# Patient Record
Sex: Female | Born: 1950
Health system: Southern US, Community
[De-identification: ages and names within clinical notes are randomized; demographics above are authoritative.]

## PROBLEM LIST (undated history)

## (undated) DIAGNOSIS — M659 Unspecified synovitis and tenosynovitis, unspecified site: Secondary | ICD-10-CM

## (undated) DIAGNOSIS — B351 Tinea unguium: Secondary | ICD-10-CM

## (undated) DIAGNOSIS — R519 Headache, unspecified: Secondary | ICD-10-CM

## (undated) DIAGNOSIS — E785 Hyperlipidemia, unspecified: Secondary | ICD-10-CM

## (undated) DIAGNOSIS — G4700 Insomnia, unspecified: Secondary | ICD-10-CM

## (undated) DIAGNOSIS — R51 Headache: Secondary | ICD-10-CM

## (undated) DIAGNOSIS — M1990 Unspecified osteoarthritis, unspecified site: Secondary | ICD-10-CM

## (undated) DIAGNOSIS — L0390 Cellulitis, unspecified: Secondary | ICD-10-CM

## (undated) DIAGNOSIS — G20 Parkinson's disease: Secondary | ICD-10-CM

## (undated) DIAGNOSIS — M719 Bursopathy, unspecified: Secondary | ICD-10-CM

## (undated) DIAGNOSIS — G2581 Restless legs syndrome: Secondary | ICD-10-CM

## (undated) HISTORY — DX: Bursopathy, unspecified: M71.9

## (undated) HISTORY — DX: Headache: R51

## (undated) HISTORY — PX: CAROTID ENDARTERECTOMY: SUR193

## (undated) HISTORY — DX: Parkinson's disease: G20

## (undated) HISTORY — DX: Cellulitis, unspecified: L03.90

## (undated) HISTORY — DX: Hyperlipidemia, unspecified: E78.5

## (undated) HISTORY — DX: Restless legs syndrome: G25.81

## (undated) HISTORY — DX: Unspecified synovitis and tenosynovitis, unspecified site: M65.90

## (undated) HISTORY — DX: Synovitis and tenosynovitis, unspecified: M65.9

## (undated) HISTORY — DX: Headache, unspecified: R51.9

## (undated) HISTORY — PX: TUBAL LIGATION: SHX77

## (undated) HISTORY — DX: Insomnia, unspecified: G47.00

## (undated) HISTORY — DX: Unspecified osteoarthritis, unspecified site: M19.90

## (undated) HISTORY — DX: Tinea unguium: B35.1

---

## 2004-11-19 ENCOUNTER — Ambulatory Visit: Payer: Self-pay | Admitting: Cardiology

## 2006-06-25 ENCOUNTER — Ambulatory Visit: Payer: Self-pay | Admitting: Internal Medicine

## 2006-07-28 ENCOUNTER — Ambulatory Visit: Payer: Self-pay | Admitting: Internal Medicine

## 2006-08-14 ENCOUNTER — Ambulatory Visit: Payer: Self-pay | Admitting: Internal Medicine

## 2006-08-22 ENCOUNTER — Ambulatory Visit: Payer: Self-pay | Admitting: Internal Medicine

## 2006-08-22 ENCOUNTER — Ambulatory Visit (HOSPITAL_COMMUNITY): Admission: RE | Admit: 2006-08-22 | Discharge: 2006-08-22 | Payer: Self-pay | Admitting: Internal Medicine

## 2006-08-27 ENCOUNTER — Ambulatory Visit: Payer: Self-pay | Admitting: Internal Medicine

## 2007-03-03 DIAGNOSIS — R05 Cough: Secondary | ICD-10-CM

## 2007-03-03 DIAGNOSIS — J13 Pneumonia due to Streptococcus pneumoniae: Secondary | ICD-10-CM | POA: Insufficient documentation

## 2007-03-03 DIAGNOSIS — R059 Cough, unspecified: Secondary | ICD-10-CM | POA: Insufficient documentation

## 2007-03-04 ENCOUNTER — Ambulatory Visit: Payer: Self-pay | Admitting: Internal Medicine

## 2007-03-04 DIAGNOSIS — R079 Chest pain, unspecified: Secondary | ICD-10-CM | POA: Insufficient documentation

## 2007-11-12 ENCOUNTER — Ambulatory Visit: Payer: Self-pay | Admitting: Internal Medicine

## 2007-11-16 ENCOUNTER — Telehealth (INDEPENDENT_AMBULATORY_CARE_PROVIDER_SITE_OTHER): Payer: Self-pay | Admitting: *Deleted

## 2007-11-20 ENCOUNTER — Ambulatory Visit: Payer: Self-pay | Admitting: Internal Medicine

## 2010-03-18 ENCOUNTER — Encounter: Payer: Self-pay | Admitting: Internal Medicine

## 2010-07-10 NOTE — Assessment & Plan Note (Signed)
[facility]                             PULMONARY OFFICE NOTE   NAME:[name] [name] [place]                       MRN:          [id number]  DATE:08/14/2006                            DOB:          1950/09/26    REASON FOR CONSULTATION:  Persistent pneumonia.   HISTORY:  This is a 60 year old female, never smoker with acute onset of  headache, nausea, and left flank pain that radiated around her naval  associated with fever and chills, all occurring abruptly on May 25. She  was hospitalized for 4 days with a diagnosis of pneumonia. She was on  antibiotics, although she does not know what type. Since then all of her  symptoms have resolved except for a dry cough that persists to the point  where she gags when she coughs, and seems worse in afternoon, and early  evening. She does have a history of seasonal rhinitis but denies any  present flare.   SUBJECTIVE:  Active itching, sneezing, subjective wheezing, overt reflux  or sinus symptoms.   She also denies any recent dental work.   She was diagnosed and treated for left lower lobe pneumonia by Dr.  Dimas Aguas but with improving density in the right lower lobe by most recent  CT scan dated June 13.   PAST MEDICAL HISTORY:  SIGNIFICANT FOR PENICILLIN ALLERGY.   SOCIAL HISTORY:  She has never smoked.   MEDICATIONS:  Include; Combivent, Advil, multivitamins, and fiber.   She works as a Research officer, trade union.   FAMILY HISTORY:  Recorded in detail, significant for allergies in her  father and sisters. Nobody has asthma however.   REVIEW OF SYSTEMS:  Taken in detail on the worksheet. Negative except as  outlined above.   PHYSICAL EXAMINATION:  This is a pleasant, ambulatory middle aged female  in no acute distress. She is afebrile, stable vital signs.  HEENT: Unremarkable.  PHARYNX: Clear. There is no evidence of excessive postnasal drainage or  cobblestoning.  NECK: Supple without cervical adenopathy or  tenderness. Trachea is  midline.  LUNG FIELDS: Perfectly clear bilaterally to auscultation and percussion.  There is a regular rate and rhythm without murmur, gallop, rub.  ABDOMEN: Soft, benign.  EXTREMITIES: Warm without calf tenderness, cyanosis, clubbing, or edema.   CT scan was reviewed as noted on June 13 as showing improvement in the  right lower lobe density.   IMPRESSION:  Chronic cough has developed in the setting of recent  pneumonia which is resolving nicely radiographically and has already  been appropriately treated. It may well be that she has a typical post  broncho pneumonia syndrome with mucociliary dysfunction, but note that  the cough is dry, mostly daytime, and occurs in the afternoon, and early  evenings which would be very unusual for an airways disease induced  cough. Therefore she may have developed a cough inducing reflux inducing  more cough, a typical cycle we see here in the pulmonary clinic, and  therefore recommended the following;  1. Take Prevacid perfectly regularly before her first meal until she      has used  up the samples we gave her (30 days). If wheezing or      shortness of breath she should use Combivent, but if coughing she      should use Mucinex DM 2 every 12 hours.  2. I would do a follow up chest x-ray (this is really not all that      critical in a never smoker with resolving infiltrates on CT scan,      but she is quite anxious about her x-rays and I would certainly      support a follow up study, but not necessarily in the pulmonary      clinic)  in 6 weeks and I would be happy to do that here in this      clinic if she still has symptoms. Otherwise she can certainly see      Dr. Dimas Aguas for this purpose and we will see her back here on a      p.r.n. basis.     Charlaine Dalton. Sherene Sires, MD, [facility]  Electronically Signed    MBW/MedQ  DD: 08/21/2006  DT: 08/21/2006  Job #: [id number]   cc:   Selinda Flavin

## 2010-07-10 NOTE — H&P (Signed)
NAME:  [name] [name], [name] [name]                ACCOUNT NO.:  [id number]   MEDICAL RECORD NO.:  [id number]          PATIENT TYPE:  AMB   LOCATION:  DAY                           FACILITY:  [facility]   PHYSICIAN:  R. Roetta Sessions, M.D. DATE OF BIRTH:  [birth year]   DATE OF ADMISSION:  DATE OF DISCHARGE:  [facility]                              HISTORY & PHYSICAL   CHIEF COMPLAINT:  Abdominal pain, nausea, constipation.   [name] [name] is a very pleasant 60 year old African American female sent  over by Dr. Dimas Aguas.  She was seen originally on June 25, 2006 with  vague left-sided abdominal pain and nausea.   She was found to have a significant amount of stool on plain films  previously.  Ultrasound and CT (noncontrast) were unrevealing.  She  became ill with headache, nausea, and abdominal pain and was admitted to  [facility] a week ago.  CT demonstrated right lower lobe  pneumonia.  She had a large stool burden in the right colon.  Dr. Dimas Aguas  called me and we discussed the case over the telephone.  It was decided  to go ahead and give this lady some GoLYTELY which she took overnight  and had copious BMs, and the abdominal pain significantly improved.  She  has been on MiraLax 17 grams orally at bedtime and apple juice since  that time.  She has finished her Levaquin and Zithromax and is only on  MiraLax.  She does have a cough but is not having any chest pain,  dyspnea, shortness of breath.  She has minimal left lower quadrant  abdominal pain.  She states she has a bowel movement every day, but it  is not a very large one.  She has had some intermittent rectal bleeding  prior to colonoscopy back in 2004 by Dr. Martha Clan that was unrevealing.  She is not having any nausea or vomiting.   PAST MEDICAL HISTORY:  Negative for chronic GI or liver illness.   CURRENT MEDICATIONS:  1. MiraLax 70 grams orally at bedtime.  2. Benefiber 2 tablets daily.  3. Ambien 10 mg at bedtime.  4. Advil p.r.n.   ALLERGIES:   PENICILLIN CAUSES SWELLING AND ITCHING, LODINE CAUSES FACIAL  SWELLING.   PAST SURGICAL HISTORY:  1. Tubal ligation.  2. Toe surgery.   FAMILY HISTORY:  Notable for chronic GI or liver illness, inflammatory  bowel disease.  Father passed away from prostate cancer.  Her brother  has diabetes.   SOCIAL HISTORY:  The patient is married.  She has 2 biological children.  She is a Architectural technologist.  She has never been a smoker.  No alcohol.   REVIEW OF SYSTEMS:  As in history of present illness.   PHYSICAL EXAMINATION:  VITAL SIGNS:  She weighed 163 on June 25, 2006.  She weighs 161.5 today.  She is accompanied by her husband.  Height 5  feet 7.5 inches.  Temperature 98.2, BP 118/80, pulse 76.  SKIN:  Warm and dry.  HEENT:  No scleral icterus.  Conjunctivae are pink.  CHEST:  Lungs are clear to  auscultation.  CARDIAC:  Regular rate and rhythm without murmur, gallop, rub.  BREASTS:  Exam is deferred.  ABDOMEN:  Flat, positive bowel sounds, soft, nontender, without  appreciable mass or organomegaly.  EXTREMITIES:  No edema.  RECTAL:  Exam deferred until the time of colonoscopy.   IMPRESSION:  [name] [name] is a very pleasant 60 year old lady with  nonspecific gastrointestinal symptoms, including abdominal pain and  nausea.  She was found to have been significantly constipated on imaging  at [facility] recently where she was admitted with pneumonia.  With a GoLYTELY purge, she has experienced significant improvement in  her symptoms.  I would have to wonder if she does not have a slow  transit.  Constipation or colonic inertia is the underlying cause of her  recent gastrointestinal symptoms.  She has had intermittent  hematochezia, and it has been some time since she last had a  colonoscopy.   RECOMMENDATIONS:  I told [name] [name] that I would allow her to fully  recover from her recent pulmonary infection.  We will plan to perform a  diagnostic colonoscopy in  approximately 2 weeks.  The potential risks,  benefits, and alternatives have been reviewed.  She has a persistent  cough for which I advised her to follow up with Dr. Dimas Aguas.  She is to  continue MiraLax 17 grams orally at bedtime.  We will make further  recommendations in the very near future.  End of dictation.      Jonathon Bellows, M.D.  Electronically Signed     RMR/MEDQ  D:  07/28/2006  T:  07/28/2006  Job:  [id number]

## 2010-07-10 NOTE — Op Note (Signed)
NAME:  [name] [name], [name] [name]                ACCOUNT NO.:  [id number]   MEDICAL RECORD NO.:  [id number]          PATIENT TYPE:  AMB   LOCATION:  DAY                           FACILITY:  [facility]   PHYSICIAN:  R. Roetta Sessions, M.D. DATE OF BIRTH:  1951/02/16   DATE OF PROCEDURE:  08/22/2006  DATE OF DISCHARGE:                               OPERATIVE REPORT   PROCEDURE:  Colonoscopy, diagnostic.   INDICATIONS FOR PROCEDURE:  A 60 year old lady with nonspecific  abdominal pain in the setting of marked constipation who has had  intermittent hematochezia along the way.  Diagnostic colonoscopy is now  being done.  It is notable, taking MiraLax daily her abdominal pain and  constipation have resolved.  She does take hydrocodone on occasion.  Colonoscopy is now being done.  This approach has been discussed with  the patient at length.  The potential risks, benefits and alternatives  have been reviewed, questions answered, she is agreeable.  Please see  documentation in medical record.   PROCEDURE NOTE:  The O2 saturation, blood pressure, pulse, respirations  were monitored throughout entire procedure.   CONSCIOUS SEDATION:  1. Versed 4 mg IV.  2. Demerol 75 mg IV in divided doses.   INSTRUMENT:  Pentax video chip system.   FINDINGS:  Digital rectal exam revealed no abnormalities.   ENDOSCOPIC FINDINGS:  The prep was adequate.  Colon:  Colonic mucosa was  surveyed from the rectosigmoid junction, the left transverse, right  colon to the appendiceal orifice, ileocecal valve and cecum.  These  structures were well seen and photographed for the record.  From this  level the scope was slowly withdrawn.  All previously mentioned mucosal  surfaces were again seen.  The patient was noted to have left-sided  diverticulum, remainder of colonic mucosa appeared normal.  She did have  a diffusely stained colonic mucosa to go with melanosis coli.  Scope was  pulled down in rectum where a thorough examination of  the rectal mucosa,  including retroflexed view of the anal verge __________ , demonstrated  only anal canal hemorrhoids.  Patient tolerated the procedure well, was  reactive to endoscopy.   IMPRESSION:  1. Anal canal hemorrhoids, otherwise normal rectum.  2. Melanosis coli.  3. Left-sided diverticulum.  4. Remainder of colonic mucosa appeared normal.   RECOMMENDATIONS:  1. Continue MiraLax orally daily.  2. High fiber diet, she is taking Benefiber, will continue that      approach.  3. Would minimize the use of narcotics as this will impede bowel      function and likely precipitate recurrent symptoms.  4. She is to follow up with Dr. Selinda Flavin.      Jonathon Bellows, M.D.  Electronically Signed     RMR/MEDQ  D:  08/22/2006  T:  08/22/2006  Job:  [id number]

## 2010-07-10 NOTE — Assessment & Plan Note (Signed)
[facility]                             PULMONARY OFFICE NOTE   NAME:[name], [name] SIGLIN                       MRN:          [id number]  DATE:08/27/2006                            DOB:          12-14-1950    PULMONARY/FINAL FOLLOW-UP OFFICE VISIT:   HISTORY:  A 60 year old black female never smoker, abruptly ill in late  May with a clinical diagnosis of pneumonia in the left lower lobe.  She  was well except for a persistent cough and a CT scan that showed  improving density when I saw her on June 19 and recommended 30 days'  worth of Prevacid because of the issue of cyclical coughing and also  suppression of the cough with Mucinex-DM.  Subsequently the cough has  totally resolved.   She had also mentioned vague right-sided posterior chest discomfort that  I assumed was related to coughing when I saw her and recommended Advil.  She says in retrospect the pain really dates back to some time in  April and has been present ever since, located in the right posterior  chest in an area about 4 cm in diameter with no antecedent history of  any cough or rash, fever or chills or pleuritic component.  She says  lying down at night seems to make it a little bit worse.  It is not  exacerbated by use of the arm or associated with any dyspnea.  Does not  get worse with coughing or deep breathing and does not improve with  Advil or even with hydrocodone.   On physical examination, she is a somewhat depressed-appearing  ambulatory black female in no acute distress.  She has stable vital signs.  HEENT:  Unremarkable.  Pharynx clear.  Lung fields perfectly clear bilaterally to auscultation and percussion.  Regular rate and rhythm without murmur, gallop, or rub.  Abdomen is soft, benign.  EXTREMITIES:  Warm without calf tenderness, cyanosis, clubbing or edema.   CT scan from June 13 showed no evidence of abnormality on the right lung  nor any pleural effusion or chest  wall abnormality.   IMPRESSION:  1. Chronic cough after apparent community-acquired pneumonia involving      the left lobe is totally resolved.  I have asked her to complete      the Prevacid and see if at that point the cough exacerbates, in      which case she may have primary reflux (my thinking was that this      was secondary reflux from the cough).  She can use Mucinex-DM on a      p.r.n. basis.  2. The right-sided chest discomfort is more problematic in that it is      typical of a neuralgia pain.  It is present 24 hours a day, does      not respond to Advil or even narcotics, and has no pleuritic or      positional features.  I have no explanation for this pain but might      recommend a repeat CT scan be done if the pain persists (  previously      I did not think a repeat CT scan was necessary on the left side      because she is a never smoker with improving infiltrates and no      effusion).  I would recommend a trial of Zostrix q.i.d. over the      area of discomfort and explained the gate theory to her.  The      other option would be considering referring her      to a pain center or use of a TENS unit, but I will defer this to      Dr. Jeannette How capable judgment and see her back here on a p.r.n.      basis.     Charlaine Dalton. [name] Sires, MD, [facility]  Electronically Signed    MBW/MedQ  DD: 08/27/2006  DT: 08/28/2006  Job #: [id number]   cc:   Selinda Flavin

## 2011-08-28 ENCOUNTER — Inpatient Hospital Stay (HOSPITAL_COMMUNITY)
Admission: AD | Admit: 2011-08-28 | Discharge: 2011-09-02 | DRG: 420 | Disposition: A | Discharge status: Home / Self Care | Payer: BC Managed Care – PPO | Source: Other Acute Inpatient Hospital | Attending: Internal Medicine | Admitting: Internal Medicine

## 2011-08-28 ENCOUNTER — Telehealth: Payer: Self-pay | Admitting: Internal Medicine

## 2011-08-28 DIAGNOSIS — R251 Tremor, unspecified: Secondary | ICD-10-CM | POA: Diagnosis present

## 2011-08-28 DIAGNOSIS — M1710 Unilateral primary osteoarthritis, unspecified knee: Secondary | ICD-10-CM | POA: Diagnosis present

## 2011-08-28 DIAGNOSIS — J13 Pneumonia due to Streptococcus pneumoniae: Secondary | ICD-10-CM

## 2011-08-28 DIAGNOSIS — R21 Rash and other nonspecific skin eruption: Secondary | ICD-10-CM

## 2011-08-28 DIAGNOSIS — H538 Other visual disturbances: Secondary | ICD-10-CM

## 2011-08-28 DIAGNOSIS — M546 Pain in thoracic spine: Secondary | ICD-10-CM | POA: Diagnosis present

## 2011-08-28 DIAGNOSIS — R7401 Elevation of levels of liver transaminase levels: Secondary | ICD-10-CM | POA: Diagnosis present

## 2011-08-28 DIAGNOSIS — K759 Inflammatory liver disease, unspecified: Secondary | ICD-10-CM | POA: Diagnosis present

## 2011-08-28 DIAGNOSIS — R7881 Bacteremia: Secondary | ICD-10-CM | POA: Diagnosis present

## 2011-08-28 DIAGNOSIS — G039 Meningitis, unspecified: Secondary | ICD-10-CM

## 2011-08-28 DIAGNOSIS — R259 Unspecified abnormal involuntary movements: Secondary | ICD-10-CM | POA: Diagnosis present

## 2011-08-28 DIAGNOSIS — R600 Localized edema: Secondary | ICD-10-CM

## 2011-08-28 DIAGNOSIS — R079 Chest pain, unspecified: Secondary | ICD-10-CM

## 2011-08-28 DIAGNOSIS — R51 Headache: Secondary | ICD-10-CM

## 2011-08-28 DIAGNOSIS — M25561 Pain in right knee: Secondary | ICD-10-CM

## 2011-08-28 DIAGNOSIS — M549 Dorsalgia, unspecified: Secondary | ICD-10-CM | POA: Diagnosis present

## 2011-08-28 DIAGNOSIS — R509 Fever, unspecified: Principal | ICD-10-CM

## 2011-08-28 DIAGNOSIS — R519 Headache, unspecified: Secondary | ICD-10-CM | POA: Diagnosis present

## 2011-08-28 DIAGNOSIS — H05229 Edema of unspecified orbit: Secondary | ICD-10-CM | POA: Diagnosis present

## 2011-08-28 DIAGNOSIS — R059 Cough, unspecified: Secondary | ICD-10-CM

## 2011-08-28 DIAGNOSIS — R05 Cough: Secondary | ICD-10-CM

## 2011-08-28 DIAGNOSIS — R7402 Elevation of levels of lactic acid dehydrogenase (LDH): Secondary | ICD-10-CM | POA: Diagnosis present

## 2011-08-28 NOTE — Telephone Encounter (Signed)
Patient from [facility] admitted there on 08/27/11 with fever and headaches - no meningeal signs, no obtundation, Blood Cultures 1/2 GPCs - maybe contaminant, Abdominal Ct negative, no recent tick bites. Mainly transferring down here for ID/Neuro consult per family request [name] [name]

## 2011-08-29 ENCOUNTER — Inpatient Hospital Stay (HOSPITAL_COMMUNITY): Payer: BC Managed Care – PPO

## 2011-08-29 ENCOUNTER — Encounter (HOSPITAL_COMMUNITY): Payer: Self-pay | Admitting: Internal Medicine

## 2011-08-29 DIAGNOSIS — R51 Headache: Secondary | ICD-10-CM

## 2011-08-29 DIAGNOSIS — M25561 Pain in right knee: Secondary | ICD-10-CM | POA: Diagnosis present

## 2011-08-29 DIAGNOSIS — R519 Headache, unspecified: Secondary | ICD-10-CM | POA: Diagnosis present

## 2011-08-29 DIAGNOSIS — M549 Dorsalgia, unspecified: Secondary | ICD-10-CM

## 2011-08-29 DIAGNOSIS — R509 Fever, unspecified: Principal | ICD-10-CM

## 2011-08-29 DIAGNOSIS — G039 Meningitis, unspecified: Secondary | ICD-10-CM | POA: Diagnosis present

## 2011-08-29 DIAGNOSIS — K759 Inflammatory liver disease, unspecified: Secondary | ICD-10-CM | POA: Diagnosis present

## 2011-08-29 DIAGNOSIS — H538 Other visual disturbances: Secondary | ICD-10-CM | POA: Diagnosis present

## 2011-08-29 DIAGNOSIS — R251 Tremor, unspecified: Secondary | ICD-10-CM | POA: Diagnosis present

## 2011-08-29 DIAGNOSIS — R600 Localized edema: Secondary | ICD-10-CM | POA: Diagnosis present

## 2011-08-29 DIAGNOSIS — R21 Rash and other nonspecific skin eruption: Secondary | ICD-10-CM | POA: Diagnosis present

## 2011-08-29 DIAGNOSIS — M25569 Pain in unspecified knee: Secondary | ICD-10-CM

## 2011-08-29 DIAGNOSIS — R7881 Bacteremia: Secondary | ICD-10-CM

## 2011-08-29 LAB — URINALYSIS, ROUTINE W REFLEX MICROSCOPIC
Bilirubin Urine: NEGATIVE
Hgb urine dipstick: NEGATIVE
Protein, ur: NEGATIVE mg/dL
Urobilinogen, UA: 0.2 mg/dL (ref 0.0–1.0)

## 2011-08-29 LAB — CBC WITH DIFFERENTIAL/PLATELET
Basophils Absolute: 0 10*3/uL (ref 0.0–0.1)
HCT: 35.8 % — ABNORMAL LOW (ref 36.0–46.0)
Hemoglobin: 12.1 g/dL (ref 12.0–15.0)
Lymphocytes Relative: 19 % (ref 12–46)
Monocytes Absolute: 0.1 10*3/uL (ref 0.1–1.0)
Monocytes Relative: 1 % — ABNORMAL LOW (ref 3–12)
Neutro Abs: 4 10*3/uL (ref 1.7–7.7)
RBC: 4.13 MIL/uL (ref 3.87–5.11)
WBC: 5.1 10*3/uL (ref 4.0–10.5)

## 2011-08-29 LAB — COMPREHENSIVE METABOLIC PANEL
BUN: 9 mg/dL (ref 6–23)
Calcium: 8.3 mg/dL — ABNORMAL LOW (ref 8.4–10.5)
Creatinine, Ser: 0.81 mg/dL (ref 0.50–1.10)
GFR calc Af Amer: >90 mL/min (ref >90)
Glucose, Bld: 138 mg/dL — ABNORMAL HIGH (ref 70–99)
Sodium: 140 mEq/L (ref 135–145)
Total Protein: 7.3 g/dL (ref 6.0–8.3)

## 2011-08-29 LAB — CSF CELL COUNT WITH DIFFERENTIAL
Eosinophils, CSF: 0 % (ref 0–1)
Lymphs, CSF: 19 % — ABNORMAL LOW (ref 40–80)
Monocyte-Macrophage-Spinal Fluid: 2 % — ABNORMAL LOW (ref 15–45)
RBC Count, CSF: 30000 /mm3 — ABNORMAL HIGH
Tube #: 1
WBC, CSF: 58 /mm3 (ref 0–5)

## 2011-08-29 LAB — PROTEIN AND GLUCOSE, CSF: Glucose, CSF: 72 mg/dL (ref 43–76)

## 2011-08-29 LAB — HEPATITIS C ANTIBODY: HCV Ab: NEGATIVE

## 2011-08-29 LAB — HIV ANTIBODY (ROUTINE TESTING W REFLEX): HIV: NONREACTIVE

## 2011-08-29 LAB — GRAM STAIN

## 2011-08-29 LAB — PROTIME-INR
INR: 1.29 (ref 0.00–1.49)
Prothrombin Time: 16.3 seconds — ABNORMAL HIGH (ref 11.6–15.2)

## 2011-08-29 LAB — CRYPTOCOCCAL ANTIGEN, CSF: Crypto Ag: NEGATIVE

## 2011-08-29 LAB — HEPATITIS B SURFACE ANTIBODY,QUALITATIVE: Hep B S Ab: POSITIVE — AB

## 2011-08-29 LAB — MONONUCLEOSIS SCREEN: Mono Screen: NEGATIVE

## 2011-08-29 LAB — SEDIMENTATION RATE: Sed Rate: 17 mm/hr (ref 0–22)

## 2011-08-29 MED ORDER — ONDANSETRON HCL 4 MG PO TABS: 4.0000 mg | ORAL_TABLET | Freq: Four times a day (QID) | ORAL | Status: DC | PRN

## 2011-08-29 MED ORDER — DEXTROSE 5 % IV SOLN
2.0000 g | Freq: Two times a day (BID) | INTRAVENOUS | Status: DC
  Administered 2011-08-29 – 2011-09-01 (×8): 2 g via INTRAVENOUS
  Filled 2011-08-29 (×9): qty 2

## 2011-08-29 MED ORDER — IBUPROFEN 400 MG PO TABS
400.0000 mg | ORAL_TABLET | Freq: Four times a day (QID) | ORAL | Status: DC | PRN
  Administered 2011-08-29 – 2011-09-02 (×7): 400 mg via ORAL
  Filled 2011-08-29 (×7): qty 1

## 2011-08-29 MED ORDER — ACETAMINOPHEN 325 MG PO TABS
650.0000 mg | ORAL_TABLET | Freq: Four times a day (QID) | ORAL | Status: DC | PRN
  Administered 2011-08-29 – 2011-09-02 (×10): 650 mg via ORAL
  Filled 2011-08-29 (×8): qty 2
  Filled 2011-08-29: qty 1
  Filled 2011-08-29: qty 2

## 2011-08-29 MED ORDER — ACETAMINOPHEN 650 MG RE SUPP: 650.0000 mg | Freq: Four times a day (QID) | RECTAL | Status: DC | PRN

## 2011-08-29 MED ORDER — SODIUM CHLORIDE 0.9 % IV SOLN
INTRAVENOUS | Status: DC
  Administered 2011-08-29: 01:00:00 via INTRAVENOUS
  Administered 2011-08-29: 1000 mL via INTRAVENOUS
  Administered 2011-08-30 – 2011-08-31 (×3): via INTRAVENOUS

## 2011-08-29 MED ORDER — VANCOMYCIN HCL 1000 MG IV SOLR
1250.0000 mg | Freq: Two times a day (BID) | INTRAVENOUS | Status: DC
  Administered 2011-08-29 – 2011-08-30 (×4): 1250 mg via INTRAVENOUS
  Filled 2011-08-29 (×5): qty 1250

## 2011-08-29 MED ORDER — DOXYCYCLINE HYCLATE 100 MG IV SOLR
100.0000 mg | Freq: Two times a day (BID) | INTRAVENOUS | Status: DC
  Administered 2011-08-29 – 2011-09-01 (×7): 100 mg via INTRAVENOUS
  Filled 2011-08-29 (×10): qty 100

## 2011-08-29 MED ORDER — ONDANSETRON HCL 4 MG/2ML IJ SOLN: 4.0000 mg | Freq: Four times a day (QID) | INTRAMUSCULAR | Status: DC | PRN

## 2011-08-29 NOTE — Procedures (Signed)
Successful fluoro L5S1 LP 10CC BLOOD TINGED csf REMOVED OPENING PRESSURE 23CM H2O NO COMP STABLE FULL REPORT IN PACS LABS SENT

## 2011-08-29 NOTE — Progress Notes (Signed)
ANTIBIOTIC CONSULT NOTE - INITIAL  Pharmacy Consult for Vancomycin/Rocephin Indication: meningitis  Allergies  Allergen Reactions  . Iodine Swelling  . Iohexol      Desc: FACIAL SWELLING   . Penicillins Itching and Swelling  . Shellfish Allergy Swelling  . Sulfa Drugs Cross Reactors Nausea And Vomiting    Patient Measurements: Height: 5\' 7"  (170.2 cm) (5) Weight: 154 lb 1.6 oz (69.9 kg) IBW/kg (Calculated) : 61.6    Vital Signs: Temp: 98.8 F (37.1 C) (07/03 2243) Temp src: Oral (07/03 2243) BP: 111/73 mmHg (07/03 2243) Pulse Rate: 89  (07/03 2243) Intake/Output from previous day:   Intake/Output from this shift:    Labs (at [facility]): SCr 0.92  No results found for this basename: WBC:3,HGB:3,PLT:3,LABCREA:3,CREATININE:3 in the last 72 hours CrCl is unknown because no creatinine reading has been taken. No results found for this basename: VANCOTROUGH:2,VANCOPEAK:2,VANCORANDOM:2,GENTTROUGH:2,GENTPEAK:2,GENTRANDOM:2,TOBRATROUGH:2,TOBRAPEAK:2,TOBRARND:2,AMIKACINPEAK:2,AMIKACINTROU:2,AMIKACIN:2, in the last 72 hours   Microbiology: No results found for this or any previous visit (from the past 720 hour(s)).  Medical History: Past Medical History  Diagnosis Date  . No pertinent past medical history     Medications:  None  Assessment: 61 yo female with possible meningitis for empiric antibiotics Received Rocephin and Vancomycin 800 mg IV at 1100 7/3 at [facility]  Goal of Therapy:  Vancomycin trough level 15-20 mcg/ml  Plan:  Rocephin 2 g IV q12h Vancomycin 1250 mg IV q12h   [name] [name] 08/29/2011,1:20 AM

## 2011-08-29 NOTE — Consult Note (Signed)
[facility]          Day 2 vancomycin        Day 2 ceftriaxone        Day 2 doxycycline       Reason for Consult: Fever and headache    Referring Physician: Dr. Jeoffrey Massed  Principal Problem:  *Fever Active Problems:  Headache  Knee pain, right  Back pain  Bacteremia due to Gram-positive bacteria  Hepatitis  Periorbital edema  Facial rash  Blurred vision, right eye  Tremor  Meningitis      . cefTRIAXone (ROCEPHIN)  IV  2 g Intravenous Q12H  . doxycycline (VIBRAMYCIN) IV  100 mg Intravenous Q12H  . vancomycin  1,250 mg Intravenous BID    Recommendations: 1. Continue current antibiotics 2. Discontinue isolation 3. Await final blood culture and CSF results 4. Check hepatitis B, C, EBV serologies and Monospot 5. Check HIV antibody 6. Agree with ANA   Assessment: I cannot be entirely certain what is causing this chest febrile illness and recent pains. However, her lumbar puncture was not described as difficult or traumatic so the CSF pleocytosis most be considered as presumed meningitis. Although her glucose and protein are normal this could be partially treated bacterial meningitis. She her family agree that she is better today so I will continue her current broad empiric therapy pending the results of blood and CSF cultures. The periorbital edema, facial rash and hepatitis also suggest the possibility of viral illness or allergic/autoimmune illness. I will follow with you. Since she has been on antibiotic therapy for greater than 24 hours there is no longer a need for respiratory precautions for meningitis.   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 She states that she has generally been in very good health until several months ago when she began to notice some right knee pain. She saw her primary care  physician, Dr. Dimas Aguas, in the [place] about 2 months ago and was told that she had arthritis. She believes she has some blood work done but does not know the results. She tried taking some Advil which provided moderate relief for her knee pain. The pain did not limit her usual activities.  About 4-1/2 weeks ago she had the sudden onset of right upper back pain. She does not recall any injury to her back. She had a similar type of back pain in 2008 before she was diagnosed with pneumonia. This pain was constant and she described it as a gnawing sedation and rated the pain in 9/10. She cannot associate the pain with position, movement, deep breathing, cough or eating. It did not change with activity. She had minimal relief with Advil. The pain was bad enough that she was considering seeing a Dr. Dimas Aguas again when she started to feel worse 5 days ago.  On June 29, she developed a global headache that spread down her shoulders and into her arms. She noted some left jaw pain. She began to develop some periorbital edema. She also noted some blurring of her right eye vision and she went to [facility] and was admitted on July 2. On admission she was febrile to 102. The periorbital edema was noticed. Yesterday she had an episode of sudden, sharp left lower quadrant pain followed by projectile vomiting. Her husband states that she also developed a  redness facial rash. He also noted an episode of violent shaking of her right arm that lasted about 10 minutes.  She was started on IV vancomycin, ceftriaxone, and doxycycline. It appears that she had at least one dose of piperacillin tazobactam as well. She underwent CT scan a number of her head, cervical spine, abdomen and pelvis with no acute abnormalities noted. One of 2 blood cultures was reported to be growing gram-positive cocci. I called there this morning and the results are still pending. She was transferred here last night for further evaluation and  management.  She states that she is feeling better today. Her right upper back pain and headache are gone. Her facial rash is diminishing. Her vision is back to normal. She has had no further episodes of right arm tremor. She has had no further episodes of abdominal pain or vomiting.  She just returned from radiology where she underwent a lumbar puncture. Her opening pressure was normal. CSF analyses revealed 30,000 red blood cells, 58 white blood cells with 79% segmented neutrophils and a normal glucose and protein. No organisms are seen on her Gram stain.   Review of Systems: The only other pertinent review of systems not noted above is that she has had an intentional 15 pound weight loss over the past year.  Past Medical History  Diagnosis Date  . No pertinent past medical history     History  Substance Use Topics  . Smoking status: Never Smoker   . Smokeless tobacco: Not on file  . Alcohol Use: No    Family History  Problem Relation Age of Onset  . Diabetes type II Sister    Allergies  Allergen Reactions  . Iodine Swelling  . Iohexol      Desc: FACIAL SWELLING   . Penicillins Itching and Swelling  . Shellfish Allergy Swelling  . Sulfa Drugs Cross Reactors Nausea And Vomiting    OBJECTIVE: Blood pressure 94/57, pulse 84, temperature 98.2 F (36.8 C), temperature source Oral, resp. rate 18, height 5\' 7"  (1.702 m), weight 69.9 kg (154 lb 1.6 oz), SpO2 96.00%. General: She is alert and appears comfortable talking with family. Skin: She has a splotchy, erythematous rash over her forehead, periorbital regions and cheeks Lymph nodes: No palpable adenopathy Oral: No oropharyngeal lesions Eyes: Mild bilateral conjunctival edema and injection. Vision is grossly intact Head and neck: Temporal and jugular artery palpation is normal. Her neck is supple. Lungs: Clear Cor: Regular S1 and S2 no murmurs Abdomen: Soft with quiet bowel sounds. I can feel a liver edge or costal margin  on deep inspiration. She has moderate right upper quadrant tenderness with palpation and mild left upper quadrant tenderness. Joints extremities: She has some crepitus with range of motion of her right knee but no pain, warmth or particular swelling  Microbiology: Recent Results (from the past 240 hour(s))  CULTURE, BLOOD (ROUTINE X 2)     Status: Normal (Preliminary result)   Collection Time   08/29/11  1:00 AM      Component Value Range Status Comment   Specimen Description BLOOD LEFT ARM   Final    Special Requests BOTTLES DRAWN AEROBIC AND ANAEROBIC 10CC EACH   Final    Culture PENDING   Incomplete    Report Status PENDING   Incomplete   CULTURE, BLOOD (ROUTINE X 2)     Status: Normal (Preliminary result)   Collection Time   08/29/11  1:10 AM      Component Value Range  Status Comment   Specimen Description BLOOD RIGHT HAND   Final    Special Requests BOTTLES DRAWN AEROBIC AND ANAEROBIC 5CC EACH   Final    Culture PENDING   Incomplete    Report Status PENDING   Incomplete   GRAM STAIN     Status: Normal   Collection Time   08/29/11 11:41 AM      Component Value Range Status Comment   Specimen Description CSF   Final    Special Requests TUBE 2 2.1CC   Final    Gram Stain     Final    Value: CYTOSPIN PREP     WBC PRESENT,BOTH PMN AND MONONUCLEAR     NO ORGANISMS SEEN   Report Status 08/29/2011 FINAL   Final     Cliffton Asters, MD [facility] [phone] pager   [phone] cell 08/29/2011, 1:02 PM

## 2011-08-29 NOTE — Progress Notes (Signed)
PATIENT DETAILS Name: [name] [name] Age: 61 y.o. Sex: female Date of Birth: [birth year] Admit Date: 08/28/2011 WUJ:[id number] Bee, MD  Subjective: Transferred from [facility] for evaluation of fever and headache. Apparrently-Fever since Saturday, Headache since last Wednesday.Right upper back/scapular area pain for 4.5 weeks. Still having headaches and fever!  Objective: Vital signs in last 24 hours: Filed Vitals:   08/28/11 2243 08/29/11 0538 08/29/11 0622 08/29/11 0715  BP: 111/73 118/72    Pulse: 89 98    Temp: 98.8 F (37.1 C) 102.9 F (39.4 C) 101.7 F (38.7 C) 98.7 F (37.1 C)  TempSrc: Oral Oral    Resp: 16 18    Height: 5\' 7"  (1.702 m)     Weight: 69.9 kg (154 lb 1.6 oz)     SpO2:  95%      Weight change:   Body mass index is 24.14 kg/(m^2).  Intake/Output from previous day: No intake or output data in the 24 hours ending 08/29/11 0840  PHYSICAL EXAM: Gen Exam: Awake and alert with clear speech.   Neck: Supple-with tenderness exhibited with flexion of neck, No JVD.  No meningeal signs  Chest: B/L Clear.   CVS: S1 S2 Regular, no murmurs.  Abdomen: soft, BS +, non tender, non distended.  Extremities: no edema, lower extremities warm to touch. Neurologic: Non Focal.   Skin: No Rash.   Wounds: N/A.    CONSULTS:  None  LAB RESULTS: CBC  Lab 08/29/11 0100  WBC 5.1  HGB 12.1  HCT 35.8*  PLT 166  MCV 86.7  MCH 29.3  MCHC 33.8  RDW 14.9  LYMPHSABS 0.9  MONOABS 0.1  EOSABS 0.0  BASOSABS 0.0  BANDABS --    Chemistries   Lab 08/29/11 0100  NA 140  K 4.3  CL 102  CO2 25  GLUCOSE 138*  BUN 9  CREATININE 0.81  CALCIUM 8.3*  MG --    CBG: No results found for this basename: GLUCAP:5 in the last 168 hours  GFR Estimated Creatinine Clearance: 72.7 ml/min (by C-G formula based on Cr of 0.81).  Coagulation profile No results found for this basename: INR:5,PROTIME:5 in the last 168 hours  Cardiac Enzymes No results found for this  basename: CK:3,CKMB:3,TROPONINI:3,MYOGLOBIN:3 in the last 168 hours  No components found with this basename: POCBNP:3 No results found for this basename: DDIMER:2 in the last 72 hours No results found for this basename: HGBA1C:2 in the last 72 hours No results found for this basename: CHOL:2,HDL:2,LDLCALC:2,TRIG:2,CHOLHDL:2,LDLDIRECT:2 in the last 72 hours No results found for this basename: TSH,T4TOTAL,FREET3,T3FREE,THYROIDAB in the last 72 hours No results found for this basename: VITAMINB12:2,FOLATE:2,FERRITIN:2,TIBC:2,IRON:2,RETICCTPCT:2 in the last 72 hours No results found for this basename: LIPASE:2,AMYLASE:2 in the last 72 hours  Urine Studies No results found for this basename: UACOL:2,UAPR:2,USPG:2,UPH:2,UTP:2,UGL:2,UKET:2,UBIL:2,UHGB:2,UNIT:2,UROB:2,ULEU:2,UEPI:2,UWBC:2,URBC:2,UBAC:2,CAST:2,CRYS:2,UCOM:2,BILUA:2 in the last 72 hours  MICROBIOLOGY: Recent Results (from the past 240 hour(s))  CULTURE, BLOOD (ROUTINE X 2)     Status: Normal (Preliminary result)   Collection Time   08/29/11  1:00 AM      Component Value Range Status Comment   Specimen Description BLOOD LEFT ARM   Final    Special Requests BOTTLES DRAWN AEROBIC AND ANAEROBIC 10CC EACH   Final    Culture PENDING   Incomplete    Report Status PENDING   Incomplete   CULTURE, BLOOD (ROUTINE X 2)     Status: Normal (Preliminary result)   Collection Time   08/29/11  1:10 AM  Component Value Range Status Comment   Specimen Description BLOOD RIGHT HAND   Final    Special Requests BOTTLES DRAWN AEROBIC AND ANAEROBIC 5CC EACH   Final    Culture PENDING   Incomplete    Report Status PENDING   Incomplete     RADIOLOGY STUDIES/RESULTS: Ct Head Wo Contrast  08/29/2011  *RADIOLOGY REPORT*  Clinical Data: Right-sided headache  CT HEAD WITHOUT CONTRAST  Technique:  Contiguous axial images were obtained from the base of the skull through the vertex without contrast.  Comparison: CT brain of 08/27/2011  Findings: The ventricular  system remains stable in size and the duration, and the septum is in a normal midline position.  The fourth ventricle and basilar cisterns are stable.  No hemorrhage, mass lesion, or acute infarction is seen.  On bone window images, there may be an empty sella present of questionable clinical significance.  No calvarial abnormality is seen.  IMPRESSION: Negative unenhanced CT of the brain.  Original Report Authenticated By: Juline Patch, M.D.   Dg Chest Port 1 View  08/29/2011  *RADIOLOGY REPORT*  Clinical Data: Chest and back pain  PORTABLE CHEST - 1 VIEW  Comparison: Chest x-ray of 08/27/2011  Findings: The lungs are clear.  The heart is borderline enlarged. There are degenerative changes throughout the thoracic spine.  IMPRESSION: No active lung disease.  Borderline cardiomegaly.  Original Report Authenticated By: Juline Patch, M.D.    MEDICATIONS: Scheduled Meds:   . cefTRIAXone (ROCEPHIN)  IV  2 g Intravenous Q12H  . doxycycline (VIBRAMYCIN) IV  100 mg Intravenous Q12H  . vancomycin  1,250 mg Intravenous BID   Continuous Infusions:   . sodium chloride 100 mL/hr at 08/29/11 0057   PRN Meds:.acetaminophen, acetaminophen, ibuprofen, ondansetron (ZOFRAN) IV, ondansetron  Antibiotics: Anti-infectives     Start     Dose/Rate Route Frequency Ordered Stop   08/29/11 0400   vancomycin (VANCOCIN) 1,250 mg in sodium chloride 0.9 % 250 mL IVPB        1,250 mg 166.7 mL/hr over 90 Minutes Intravenous 2 times daily 08/29/11 0145     08/29/11 0230   cefTRIAXone (ROCEPHIN) 2 g in dextrose 5 % 50 mL IVPB        2 g 100 mL/hr over 30 Minutes Intravenous Every 12 hours 08/29/11 0145     08/29/11 0130   doxycycline (VIBRAMYCIN) 100 mg in dextrose 5 % 250 mL IVPB        100 mg 125 mL/hr over 120 Minutes Intravenous Every 12 hours 08/29/11 0110            Assessment/Plan: Principal Problem:  *Fever -given headache-need LP to make sure we are not dealing with meningitis-no other foci of  sepsis apparent on exam -Flouro guided LP ordered-await CSF studies -one set of blood cultures at [place]-positive from gram positive cocci-will need to follow this -continue with current antibiotics -may need to get ID input depending on CSF studies -ESR only 17!!  Gram positive baceteremia One set of Blood cultures positive at [facility] for gram positive cocci -await further results -blood cultures drawn here as well-results pending -on Vancomycin   Headache -need to rule out Meningitis -CT head negative  Right upper back pain -going on 4 weeks or so -if blood cultures positive for definite bugs-then this may be an embolic site and may need further imaging  Right knee OA -benign knee exam -as needed Tylenol or NSAIDS  Disposition: -remain inpatient  DVT Prophylaxis: -  start prophylactic lovenox once LP done  Code Status: Full Code  Jeoffrey Massed, MD  [facility] Pager [phone]  If 7PM-7AM, please contact night-coverage [email or web address] Password [email or web address] 08/29/2011, 8:40 AM   LOS: 1 day

## 2011-08-29 NOTE — H&P (Addendum)
[name] [name] is an 61 y.o. female.   PCP - Dr.Howard in [place].       Chief Complaint: Transferred from [facility] for further workup for the headache and fever. HPI: 61 year old female presented to the ER at [facility] with complaints of headache and fever on August 27, 2011. Patient has been having headache and fever since last Saturday was almost 6 days now. Prior to this patient has been experiencing upper back pain. Patient's headache is mostly frontal and bitemporal with some blurred vision in the right eye. The headache radiates to her neck and upper back. The pain has been persistent and has no aggravating or relieving factor. At [facility] patient had CT head and C-spine which did not show anything acute. After admission to the hospital patient had at least 4 episodes of nausea vomiting with some left lower quadrant pain. For which patient had undergone CT abdomen pelvis which also did not show anything acute.(Please refer to the chart with reports). Patient's lab works are mostly unremarkable except for one of the blood cultures growing gram-positive cocci. At this time patient has been transferred to [facility] for further workup including consultations with infectious disease/neurology at the request of family and patient. While at [facility] patient did receive vancomycin/doxycycline/ceftriaxone and Zosyn was added later per the records. Patient also had developed right knee swelling over the last 4 months has been treated for arthritis by the PCP. Patient denies any chest pain shortness of breath presently has no abdominal pain still has mild pain in the frontal head and neck. Denies any neck rigidity. Presently has no blurred vision. Denies any focal deficits. Denies any dizziness or loss of consciousness. Denies anybody else in the family having similar complaints. Denies any recent travel to the mountains or any foreign countries. Denies any insect  bites. Denies noticing any rash on the body. Patient's labs available in the chart provided at the time of transfer shows a sodium of 135 potassium of 4.1 chloride of 98 bicarbonate of 29 BUN 8 creatinine 0.9 glucose 120 calcium 9.1 WBC 5.9 hemoglobin 13.3 hematocrit 40.2 results 170 neutrophils 73.8% urine was negative for ketones negative for nitrites and leukocyte esterase.   Past Medical History  Diagnosis Date  . No pertinent past medical history     Past Surgical History  Procedure Date  . Tubal ligation     Family History  Problem Relation Age of Onset  . Diabetes type II Sister    Social History:  reports that she has never smoked. She does not have any smokeless tobacco history on file. She reports that she does not drink alcohol or use illicit drugs.  Allergies:  Allergies  Allergen Reactions  . Iodine Swelling  . Iohexol      Desc: FACIAL SWELLING   . Penicillins Itching and Swelling  . Shellfish Allergy Swelling  . Sulfa Drugs Cross Reactors Nausea And Vomiting    Medications Prior to Admission  Medication Sig Dispense Refill  . Ascorbic Acid (VITAMIN C PO) Take 1 tablet by mouth daily.      Marland Kitchen b complex vitamins capsule Take 1 capsule by mouth daily.      . Cyanocobalamin (VITAMIN B-12 PO) Take 1 tablet by mouth daily.      . folic acid (FOLVITE) 1 MG tablet Take 1 mg by mouth daily.      Marland Kitchen ibuprofen (ADVIL,MOTRIN) 200 MG tablet Take 200 mg by mouth every 6 (six) hours as  needed. For fever/pain      . Pyridoxine HCl (VITAMIN B-6 PO) Take 1 tablet by mouth daily.        No results found for this or any previous visit (from the past 48 hour(s)). No results found.  Review of Systems  Constitutional: Positive for fever.  Eyes: Positive for blurred vision.  Respiratory: Negative.   Cardiovascular: Negative.   Gastrointestinal: Positive for nausea, vomiting and abdominal pain.  Genitourinary: Negative.   Musculoskeletal: Negative.   Skin: Negative.    Neurological: Positive for headaches.  Endo/Heme/Allergies: Negative.   Psychiatric/Behavioral: Negative.     Blood pressure 111/73, pulse 89, temperature 98.8 F (37.1 C), temperature source Oral, resp. rate 16, height 5\' 7"  (1.702 m), weight 69.9 kg (154 lb 1.6 oz). Physical Exam  Constitutional: She is oriented to person, place, and time. She appears well-developed and well-nourished. No distress.  HENT:  Head: Normocephalic and atraumatic.  Right Ear: External ear normal.  Left Ear: External ear normal.  Nose: Nose normal.  Mouth/Throat: Oropharynx is clear and moist. No oropharyngeal exudate.  Eyes: Conjunctivae are normal. Pupils are equal, round, and reactive to light. Right eye exhibits no discharge. Left eye exhibits no discharge. No scleral icterus.  Neck: Normal range of motion. Neck supple.  Cardiovascular: Normal rate and regular rhythm.   Respiratory: Effort normal and breath sounds normal. No respiratory distress. She has no wheezes. She has no rales.  GI: Soft. Bowel sounds are normal. She exhibits no distension. There is no tenderness. There is no rebound.  Musculoskeletal: Normal range of motion. She exhibits no edema and no tenderness.       Mild  Swelling in the right knee.  Neurological: She is alert and oriented to person, place, and time.       Moves all extremities.   Skin: Skin is warm and dry. She is not diaphoretic.  Psychiatric: Her behavior is normal.     Assessment/Plan #1. Fever with headache - reviewing patient's charts patient's fever had gone up to 102F. Patient at this time is afebrile. I have reordered blood cultures and urine cultures. Check complete metabolic panel CBC with differential count and chest x-ray. Urinalysis. Since patient's one of the blood culture was growing gram-positive cocci patient will be restarted on vancomycin. At this time I have placed patient on meningitis dose of vancomycin, ceftriaxone and also doxycycline. Since  patient has right knee swelling I have ordered a Lyme titer. I have also ordered RMSF titer. I have discussed with radiologist and arranged for fluoroscopic guided lumbar puncture. At lumbar puncture to check for opening pressure, cell count and differential, Gram stain culture and sensitivity and HSV PCR and to hold one tube for further tests. And also ordered 2-D echo and sedimentation rate and ANA. To consult infectious disease consultant in a.m. for further recommendation.  Further recommendations based on the tests ordered and clinical course.  Corian Handley N. 08/29/2011, 12:34 AM

## 2011-08-29 NOTE — Progress Notes (Signed)
CRITICAL VALUE ALERT  Critical value received:  WBC in spinal fluid was 58  Date of notification:  08/29/11  Time of notification:  1300    Critical value read back:yes  Nurse who received alert:  Forbes Cellar, RN  MD notified (1st page):  Dr. Jerral Ralph  Time of first page:  1302  MD notified (2nd page):  Time of second page:  Responding MD:  Dr. Jerral Ralph  Time MD responded:  [phone]

## 2011-08-30 ENCOUNTER — Inpatient Hospital Stay (HOSPITAL_COMMUNITY): Payer: BC Managed Care – PPO

## 2011-08-30 DIAGNOSIS — K759 Inflammatory liver disease, unspecified: Secondary | ICD-10-CM

## 2011-08-30 DIAGNOSIS — I059 Rheumatic mitral valve disease, unspecified: Secondary | ICD-10-CM

## 2011-08-30 LAB — URINE CULTURE
Colony Count: NO GROWTH
Culture: NO GROWTH

## 2011-08-30 LAB — HEPATIC FUNCTION PANEL
Alkaline Phosphatase: 57 U/L (ref 39–117)
Total Protein: 6.4 g/dL (ref 6.0–8.3)

## 2011-08-30 LAB — BASIC METABOLIC PANEL
CO2: 26 mEq/L (ref 19–32)
Calcium: 8.1 mg/dL — ABNORMAL LOW (ref 8.4–10.5)
Creatinine, Ser: 0.68 mg/dL (ref 0.50–1.10)
GFR calc Af Amer: >90 mL/min (ref >90)
GFR calc non Af Amer: >90 mL/min (ref >90)

## 2011-08-30 LAB — HERPES SIMPLEX VIRUS(HSV) DNA BY PCR

## 2011-08-30 LAB — LACTATE DEHYDROGENASE: LDH: 411 U/L — ABNORMAL HIGH (ref 94–250)

## 2011-08-30 LAB — CBC
MCH: 28.8 pg (ref 26.0–34.0)
MCHC: 33 g/dL (ref 30.0–36.0)
MCV: 87.5 fL (ref 78.0–100.0)
Platelets: 171 10*3/uL (ref 150–400)
RDW: 15 % (ref 11.5–15.5)

## 2011-08-30 LAB — VANCOMYCIN, TROUGH: Vancomycin Tr: 12.5 ug/mL (ref 10.0–20.0)

## 2011-08-30 LAB — PATHOLOGIST SMEAR REVIEW

## 2011-08-30 MED ORDER — ENSURE COMPLETE PO LIQD
237.0000 mL | Freq: Three times a day (TID) | ORAL | Status: DC
  Administered 2011-08-30 – 2011-09-02 (×10): 237 mL via ORAL

## 2011-08-30 MED ORDER — VANCOMYCIN HCL 1000 MG IV SOLR
1250.0000 mg | Freq: Three times a day (TID) | INTRAVENOUS | Status: DC
  Administered 2011-08-31 – 2011-09-01 (×4): 1250 mg via INTRAVENOUS
  Filled 2011-08-30 (×6): qty 1250

## 2011-08-30 MED ORDER — GADOBENATE DIMEGLUMINE 529 MG/ML IV SOLN
15.0000 mL | Freq: Once | INTRAVENOUS | Status: AC
  Administered 2011-08-30: 15 mL via INTRAVENOUS

## 2011-08-30 MED ORDER — ENOXAPARIN SODIUM 40 MG/0.4ML ~~LOC~~ SOLN
40.0000 mg | SUBCUTANEOUS | Status: DC
  Administered 2011-08-30 – 2011-09-02 (×4): 40 mg via SUBCUTANEOUS
  Filled 2011-08-30 (×4): qty 0.4

## 2011-08-30 NOTE — Progress Notes (Signed)
PATIENT DETAILS Name: [name] [name] Age: 61 y.o. Sex: female Date of Birth: 11-May-1950 Admit Date: 08/28/2011 ZOX:WRUEAV, KEVIN, MD  Subjective: Still febrile Continues to have headaches.  Objective: Vital signs in last 24 hours: Filed Vitals:   08/29/11 2158 08/30/11 0013 08/30/11 0245 08/30/11 0622  BP: 101/66   127/83  Pulse: 98   102  Temp: 100.5 F (38.1 C) 101.5 F (38.6 C) 102.5 F (39.2 C) 103.3 F (39.6 C)  TempSrc: Rectal Rectal Rectal Rectal  Resp: 18   20  Height:      Weight:      SpO2: 96%   97%    Weight change:   Body mass index is 24.14 kg/(m^2).  Intake/Output from previous day:  Intake/Output Summary (Last 24 hours) at 08/30/11 0800 Last data filed at 08/30/11 4098  Gross per 24 hour  Intake   4305 ml  Output      6 ml  Net   4299 ml    PHYSICAL EXAM: Gen Exam: Awake and alert with clear speech.   Neck: Supple-with tenderness exhibited with flexion of neck, No JVD.  No meningeal signs  Chest: B/L Clear.  No rales or rhonchi CVS: S1 S2 Regular, no murmurs.  Abdomen: soft, BS +, non tender, non distended.  Extremities: no edema, lower extremities warm to touch. Neurologic: Non Focal.   Skin: No Rash.   Wounds: N/A.    CONSULTS:  None  LAB RESULTS: CBC  Lab 08/30/11 0710 08/29/11 0100  WBC 7.6 5.1  HGB 11.5* 12.1  HCT 34.9* 35.8*  PLT 171 166  MCV 87.5 86.7  MCH 28.8 29.3  MCHC 33.0 33.8  RDW 15.0 14.9  LYMPHSABS -- 0.9  MONOABS -- 0.1  EOSABS -- 0.0  BASOSABS -- 0.0  BANDABS -- --    Chemistries   Lab 08/29/11 0100  NA 140  K 4.3  CL 102  CO2 25  GLUCOSE 138*  BUN 9  CREATININE 0.81  CALCIUM 8.3*  MG --    CBG: No results found for this basename: GLUCAP:5 in the last 168 hours  GFR Estimated Creatinine Clearance: 72.7 ml/min (by C-G formula based on Cr of 0.81).  Coagulation profile  Lab 08/29/11 0900  INR 1.29  PROTIME --    Cardiac Enzymes No results found for this basename:  CK:3,CKMB:3,TROPONINI:3,MYOGLOBIN:3 in the last 168 hours  No components found with this basename: POCBNP:3 No results found for this basename: DDIMER:2 in the last 72 hours No results found for this basename: HGBA1C:2 in the last 72 hours No results found for this basename: CHOL:2,HDL:2,LDLCALC:2,TRIG:2,CHOLHDL:2,LDLDIRECT:2 in the last 72 hours No results found for this basename: TSH,T4TOTAL,FREET3,T3FREE,THYROIDAB in the last 72 hours No results found for this basename: VITAMINB12:2,FOLATE:2,FERRITIN:2,TIBC:2,IRON:2,RETICCTPCT:2 in the last 72 hours No results found for this basename: LIPASE:2,AMYLASE:2 in the last 72 hours  Urine Studies No results found for this basename: UACOL:2,UAPR:2,USPG:2,UPH:2,UTP:2,UGL:2,UKET:2,UBIL:2,UHGB:2,UNIT:2,UROB:2,ULEU:2,UEPI:2,UWBC:2,URBC:2,UBAC:2,CAST:2,CRYS:2,UCOM:2,BILUA:2 in the last 72 hours  MICROBIOLOGY: Recent Results (from the past 240 hour(s))  CULTURE, BLOOD (ROUTINE X 2)     Status: Normal (Preliminary result)   Collection Time   08/29/11  1:00 AM      Component Value Range Status Comment   Specimen Description BLOOD LEFT ARM   Final    Special Requests BOTTLES DRAWN AEROBIC AND ANAEROBIC 10CC EACH   Final    Culture  Setup Time 08/29/2011 16:50   Final    Culture     Final    Value:  BLOOD CULTURE RECEIVED NO GROWTH TO DATE CULTURE WILL BE HELD FOR 5 DAYS BEFORE ISSUING A FINAL NEGATIVE REPORT   Report Status PENDING   Incomplete   CULTURE, BLOOD (ROUTINE X 2)     Status: Normal (Preliminary result)   Collection Time   08/29/11  1:10 AM      Component Value Range Status Comment   Specimen Description BLOOD RIGHT HAND   Final    Special Requests BOTTLES DRAWN AEROBIC AND ANAEROBIC 5CC EACH   Final    Culture  Setup Time 08/29/2011 16:50   Final    Culture     Final    Value:        BLOOD CULTURE RECEIVED NO GROWTH TO DATE CULTURE WILL BE HELD FOR 5 DAYS BEFORE ISSUING A FINAL NEGATIVE REPORT   Report Status PENDING   Incomplete     CSF CULTURE     Status: Normal (Preliminary result)   Collection Time   08/29/11 11:41 AM      Component Value Range Status Comment   Specimen Description CSF   Final    Special Requests TUBE 2 2.1CC   Final    Gram Stain     Final    Value: CYTOSPIN WBC PRESENT,BOTH PMN AND MONONUCLEAR     NO ORGANISMS SEEN     Performed at [facility]   Culture NO GROWTH 1 DAY   Final    Report Status PENDING   Incomplete   GRAM STAIN     Status: Normal   Collection Time   08/29/11 11:41 AM      Component Value Range Status Comment   Specimen Description CSF   Final    Special Requests TUBE 2 2.1CC   Final    Gram Stain     Final    Value: CYTOSPIN PREP     WBC PRESENT,BOTH PMN AND MONONUCLEAR     NO ORGANISMS SEEN   Report Status 08/29/2011 FINAL   Final     RADIOLOGY STUDIES/RESULTS: Ct Head Wo Contrast  08/29/2011  *RADIOLOGY REPORT*  Clinical Data: Right-sided headache  CT HEAD WITHOUT CONTRAST  Technique:  Contiguous axial images were obtained from the base of the skull through the vertex without contrast.  Comparison: CT brain of 08/27/2011  Findings: The ventricular system remains stable in size and the duration, and the septum is in a normal midline position.  The fourth ventricle and basilar cisterns are stable.  No hemorrhage, mass lesion, or acute infarction is seen.  On bone window images, there may be an empty sella present of questionable clinical significance.  No calvarial abnormality is seen.  IMPRESSION: Negative unenhanced CT of the brain.  Original Report Authenticated By: Juline Patch, M.D.   Dg Chest Port 1 View  08/29/2011  *RADIOLOGY REPORT*  Clinical Data: Chest and back pain  PORTABLE CHEST - 1 VIEW  Comparison: Chest x-ray of 08/27/2011  Findings: The lungs are clear.  The heart is borderline enlarged. There are degenerative changes throughout the thoracic spine.  IMPRESSION: No active lung disease.  Borderline cardiomegaly.  Original Report Authenticated By: Juline Patch, M.D.    MEDICATIONS: Scheduled Meds:    . cefTRIAXone (ROCEPHIN)  IV  2 g Intravenous Q12H  . doxycycline (VIBRAMYCIN) IV  100 mg Intravenous Q12H  . vancomycin  1,250 mg Intravenous BID   Continuous Infusions:    . sodium chloride 1,000 mL (08/29/11 1833)   PRN Meds:.acetaminophen, acetaminophen, ibuprofen,  ondansetron (ZOFRAN) IV, ondansetron  Antibiotics: Anti-infectives     Start     Dose/Rate Route Frequency Ordered Stop   08/29/11 0400   vancomycin (VANCOCIN) 1,250 mg in sodium chloride 0.9 % 250 mL IVPB        1,250 mg 166.7 mL/hr over 90 Minutes Intravenous 2 times daily 08/29/11 0145     08/29/11 0230   cefTRIAXone (ROCEPHIN) 2 g in dextrose 5 % 50 mL IVPB        2 g 100 mL/hr over 30 Minutes Intravenous Every 12 hours 08/29/11 0145     08/29/11 0130   doxycycline (VIBRAMYCIN) 100 mg in dextrose 5 % 250 mL IVPB        100 mg 125 mL/hr over 120 Minutes Intravenous Every 12 hours 08/29/11 0110            Assessment/Plan: Principal Problem:  *Fever -?viral syndrome with viral meningitis -s/p LP-7/4-CSF-with normal protein and glucose -CSF cultures neg so far -Urine and Blood cultures done on 08/29/11 -neg so far -on IV Vanco/Rocephin and Doxy-started 7/4 -may need to scan chest if fever still persists -HIV neg -ANA pending -Mono screen-neg -Lymes/RMSF titres-pending  Gram positive baceteremia One set of Blood cultures positive at [facility] for gram positive cocci -await further results -blood cultures drawn here on 7/4-neg so far-on Vancomycin   Headache -need to rule out Meningitis-CSF not very convincing for meningitis -CT head on 7/4 negative for acute abnormalities  Right upper back pain -going on 4 weeks or so -if blood cultures positive for definite bugs-then this may be an embolic site and may need further imaging  Right knee OA -benign knee exam -as needed Tylenol or NSAIDS  Disposition: -remain inpatient  DVT  Prophylaxis: -start prophylactic lovenox  Code Status: Full Code  Jeoffrey Massed, MD  [facility] Pager [phone]  If 7PM-7AM, please contact night-coverage [email or web address] Password [email or web address] 08/30/2011, 8:00 AM   LOS: 2 days

## 2011-08-30 NOTE — Progress Notes (Signed)
  Echocardiogram 2D Echocardiogram has been performed.  Cranford Blessinger 08/30/2011, 11:30 AM

## 2011-08-30 NOTE — Progress Notes (Signed)
ANTIBIOTIC CONSULT NOTE - FOLLOW UP  Pharmacy Consult for Vancomycin Indication: r/o meningitis  Allergies  Allergen Reactions  . Iodine Swelling  . Iohexol      Desc: FACIAL SWELLING   . Penicillins Itching and Swelling  . Shellfish Allergy Swelling  . Sulfa Drugs Cross Reactors Nausea And Vomiting    Patient Measurements: Height: 5\' 7"  (170.2 cm) Weight: 154 lb 1.6 oz (69.9 kg) IBW/kg (Calculated) : 61.6   Vital Signs: Temp: 98.9 F (37.2 C) (07/05 2212) Temp src: Rectal (07/05 1610) BP: 105/67 mmHg (07/05 2212) Pulse Rate: 89  (07/05 2212) Intake/Output from previous day: 07/04 0701 - 07/05 0700 In: 4305 [I.V.:2905; IV Piggyback:1400] Out: 6 [Urine:6] Intake/Output from this shift:    Labs:  Basename 08/30/11 0710 08/29/11 0100  WBC 7.6 5.1  HGB 11.5* 12.1  PLT 171 166  LABCREA -- --  CREATININE 0.68 0.81   Estimated Creatinine Clearance: 73.6 ml/min (by C-G formula based on Cr of 0.68).  Basename 08/30/11 1936  VANCOTROUGH 12.5  VANCOPEAK --  Drue Dun --  GENTTROUGH --  GENTPEAK --  GENTRANDOM --  TOBRATROUGH --  Nolen Mu --  TOBRARND --  AMIKACINPEAK --  AMIKACINTROU --  AMIKACIN --     Microbiology: Recent Results (from the past 720 hour(s))  CULTURE, BLOOD (ROUTINE X 2)     Status: Normal (Preliminary result)   Collection Time   08/29/11  1:00 AM      Component Value Range Status Comment   Specimen Description BLOOD LEFT ARM   Final    Special Requests BOTTLES DRAWN AEROBIC AND ANAEROBIC 10CC EACH   Final    Culture  Setup Time 08/29/2011 16:50   Final    Culture     Final    Value:        BLOOD CULTURE RECEIVED NO GROWTH TO DATE CULTURE WILL BE HELD FOR 5 DAYS BEFORE ISSUING A FINAL NEGATIVE REPORT   Report Status PENDING   Incomplete   CULTURE, BLOOD (ROUTINE X 2)     Status: Normal (Preliminary result)   Collection Time   08/29/11  1:10 AM      Component Value Range Status Comment   Specimen Description BLOOD RIGHT HAND   Final    Special Requests BOTTLES DRAWN AEROBIC AND ANAEROBIC 5CC EACH   Final    Culture  Setup Time 08/29/2011 16:50   Final    Culture     Final    Value:        BLOOD CULTURE RECEIVED NO GROWTH TO DATE CULTURE WILL BE HELD FOR 5 DAYS BEFORE ISSUING A FINAL NEGATIVE REPORT   Report Status PENDING   Incomplete   URINE CULTURE     Status: Normal   Collection Time   08/29/11  2:53 AM      Component Value Range Status Comment   Specimen Description URINE, CLEAN CATCH   Final    Special Requests NONE   Final    Culture  Setup Time 08/29/2011 19:29   Final    Colony Count NO GROWTH   Final    Culture NO GROWTH   Final    Report Status 08/30/2011 FINAL   Final   CSF CULTURE     Status: Normal (Preliminary result)   Collection Time   08/29/11 11:41 AM      Component Value Range Status Comment   Specimen Description CSF   Final    Special Requests TUBE 2 2.1CC   Final  Gram Stain     Final    Value: CYTOSPIN WBC PRESENT,BOTH PMN AND MONONUCLEAR     NO ORGANISMS SEEN     Performed at [facility]   Culture NO GROWTH 1 DAY   Final    Report Status PENDING   Incomplete   GRAM STAIN     Status: Normal   Collection Time   08/29/11 11:41 AM      Component Value Range Status Comment   Specimen Description CSF   Final    Special Requests TUBE 2 2.1CC   Final    Gram Stain     Final    Value: CYTOSPIN PREP     WBC PRESENT,BOTH PMN AND MONONUCLEAR     NO ORGANISMS SEEN   Report Status 08/29/2011 FINAL   Final     Anti-infectives     Start     Dose/Rate Route Frequency Ordered Stop   08/29/11 0400   vancomycin (VANCOCIN) 1,250 mg in sodium chloride 0.9 % 250 mL IVPB        1,250 mg 166.7 mL/hr over 90 Minutes Intravenous 2 times daily 08/29/11 0145     08/29/11 0230   cefTRIAXone (ROCEPHIN) 2 g in dextrose 5 % 50 mL IVPB        2 g 100 mL/hr over 30 Minutes Intravenous Every 12 hours 08/29/11 0145     08/29/11 0130   doxycycline (VIBRAMYCIN) 100 mg in dextrose 5 % 250 mL IVPB        100  mg 125 mL/hr over 120 Minutes Intravenous Every 12 hours 08/29/11 0110            Assessment: 61 yo F on Vancomycin, Rocephin, and Doxycycline for r/o meningitis.  MC cx data remains NGTD, however pt did have 1/2 postitive gpc blood cx at [facility] (suspected contaminant per ID).  Pt continues to have fevers and tremors to R hand.  Vancomycin trough = 12.5 on Vancomycin 1250mg  IV Q12h.  Will increase dose to target trough 15-20 for r/o meningitis.  Estimate trough of 17 on Vancomycin 1250 mg IV q8h.  Will need to monitor closely for drug accumulation considering patient is 61 yo and on Q8h regimen.  Goal of Therapy:  Vancomycin trough level 15-20 mcg/ml  Plan:  Change Vancomycin to 1250 mg IV q8h.  Next dose due 0600 08/31/11. Vancomycin trough likely Sunday AM if antibiotics continue. Follow-up culture data, renal function, and clinical progress.  [employer], Pharm.D., BCPS Clinical Pharmacist Pager [phone] 08/30/2011 10:53 PM

## 2011-08-30 NOTE — Progress Notes (Signed)
Patient ID: [name] [name], female   DOB: [birth year], 61 y.o.   MRN: [id number]    [facility] for Infectious Disease    Date of Admission:  08/28/2011           Day 3 vancomycin        Day 3 ceftriaxone        Day  3 doxycycline Principal Problem:  *Fever Active Problems:  Headache  Knee pain, right  Back pain  Bacteremia due to Gram-positive bacteria  Hepatitis  Periorbital edema  Facial rash  Blurred vision, right eye  Tremor  Meningitis      . cefTRIAXone (ROCEPHIN)  IV  2 g Intravenous Q12H  . doxycycline (VIBRAMYCIN) IV  100 mg Intravenous Q12H  . enoxaparin (LOVENOX) injection  40 mg Subcutaneous Q24H  . feeding supplement  237 mL Oral TID WC  . vancomycin  1,250 mg Intravenous BID    Subjective: She states that she is feeling better today. She denies having any headache, neck pain or right upper back pain. However she is having some pain and sensitivity to hot and cold liquids in her left mandibular molars. She and her daughter have also noticed 2 more episodes where she had tremors in her right hand. One last night occurred at rest and this morning it occurred while she was reaching for her fork. Each episode lasted several minutes. She is also noticed some mild left lower quadrant pain and nausea but no vomiting.  Objective: Temp:  [98 F (36.7 C)-103.3 F (39.6 C)] 101.6 F (38.7 C) (07/05 0847) Pulse Rate:  [89-109] 102  (07/05 0622) Resp:  [18-20] 20  (07/05 0622) BP: (101-127)/(66-83) 127/83 mmHg (07/05 0622) SpO2:  [96 %-97 %] 97 % (07/05 0622)  General: She looks better. Her periorbital edema has resolved and she no longer has any facial erythema. Skin: No rash or palpable adenopathy Oral: She has missing teeth. She has fillings in her left mandibular teeth. I cannot palpate any abnormalities of the gumline. She has no loose teeth or pain with palpation. Lungs: Clear Cor: Regular S1 and S2 with no murmurs Abdomen: Soft and nontender. I do not feel a  liver, spleen or any other masses Her joints and extremities are normal  Lab Results Lab Results  Component Value Date   WBC 7.6 08/30/2011   HGB 11.5* 08/30/2011   HCT 34.9* 08/30/2011   MCV 87.5 08/30/2011   PLT 171 08/30/2011    Lab Results  Component Value Date   CREATININE 0.68 08/30/2011   BUN 6 08/30/2011   NA 137 08/30/2011   K 3.6 08/30/2011   CL 101 08/30/2011   CO2 26 08/30/2011    Lab Results  Component Value Date   ALT 49* 08/30/2011   AST 40* 08/30/2011   ALKPHOS 57 08/30/2011   BILITOT 0.2* 08/30/2011      Microbiology: Recent Results (from the past 240 hour(s))  CULTURE, BLOOD (ROUTINE X 2)     Status: Normal (Preliminary result)   Collection Time   08/29/11  1:00 AM      Component Value Range Status Comment   Specimen Description BLOOD LEFT ARM   Final    Special Requests BOTTLES DRAWN AEROBIC AND ANAEROBIC 10CC EACH   Final    Culture  Setup Time 08/29/2011 16:50   Final    Culture     Final    Value:        BLOOD CULTURE RECEIVED NO GROWTH TO  DATE CULTURE WILL BE HELD FOR 5 DAYS BEFORE ISSUING A FINAL NEGATIVE REPORT   Report Status PENDING   Incomplete   CULTURE, BLOOD (ROUTINE X 2)     Status: Normal (Preliminary result)   Collection Time   08/29/11  1:10 AM      Component Value Range Status Comment   Specimen Description BLOOD RIGHT HAND   Final    Special Requests BOTTLES DRAWN AEROBIC AND ANAEROBIC 5CC EACH   Final    Culture  Setup Time 08/29/2011 16:50   Final    Culture     Final    Value:        BLOOD CULTURE RECEIVED NO GROWTH TO DATE CULTURE WILL BE HELD FOR 5 DAYS BEFORE ISSUING A FINAL NEGATIVE REPORT   Report Status PENDING   Incomplete   CSF CULTURE     Status: Normal (Preliminary result)   Collection Time   08/29/11 11:41 AM      Component Value Range Status Comment   Specimen Description CSF   Final    Special Requests TUBE 2 2.1CC   Final    Gram Stain     Final    Value: CYTOSPIN WBC PRESENT,BOTH PMN AND MONONUCLEAR     NO ORGANISMS SEEN     Performed at  [facility]   Culture NO GROWTH 1 DAY   Final    Report Status PENDING   Incomplete   GRAM STAIN     Status: Normal   Collection Time   08/29/11 11:41 AM      Component Value Range Status Comment   Specimen Description CSF   Final    Special Requests TUBE 2 2.1CC   Final    Gram Stain     Final    Value: CYTOSPIN PREP     WBC PRESENT,BOTH PMN AND MONONUCLEAR     NO ORGANISMS SEEN   Report Status 08/29/2011 FINAL   Final     Studies/Results: Ct Head Wo Contrast  08/29/2011  *RADIOLOGY REPORT*  Clinical Data: Right-sided headache  CT HEAD WITHOUT CONTRAST  Technique:  Contiguous axial images were obtained from the base of the skull through the vertex without contrast.  Comparison: CT brain of 08/27/2011  Findings: The ventricular system remains stable in size and the duration, and the septum is in a normal midline position.  The fourth ventricle and basilar cisterns are stable.  No hemorrhage, mass lesion, or acute infarction is seen.  On bone window images, there may be an empty sella present of questionable clinical significance.  No calvarial abnormality is seen.  IMPRESSION: Negative unenhanced CT of the brain.  Original Report Authenticated By: Juline Patch, M.D.   Dg Chest Port 1 View  08/29/2011  *RADIOLOGY REPORT*  Clinical Data: Chest and back pain  PORTABLE CHEST - 1 VIEW  Comparison: Chest x-ray of 08/27/2011  Findings: The lungs are clear.  The heart is borderline enlarged. There are degenerative changes throughout the thoracic spine.  IMPRESSION: No active lung disease.  Borderline cardiomegaly.  Original Report Authenticated By: Juline Patch, M.D.   Dg Fluoro Guide Lumbar Puncture  08/29/2011  *RADIOLOGY REPORT*  Clinical Data:  Headache, fever  DIAGNOSTIC LUMBAR PUNCTURE UNDER FLUOROSCOPIC GUIDANCE  Fluoroscopy time:  0.35 minutes.  Technique:  Informed consent was obtained from the patient prior to the procedure, including potential complications of headache, allergy, and  pain.   With the patient prone, the lower back was prepped with  Betadine.  1% Lidocaine was used for local anesthesia. Lumbar puncture was performed at the L5 S1 level using a 20 gauge needle with return of blood tinged CSF with an opening pressure of 23 cm water.   10 ml of CSF were obtained for laboratory studies. The patient tolerated the procedure well and there were no apparent complications.  IMPRESSION: Successful fluoroscopic lumbar puncture  Original Report Authenticated By: Judie Petit. Ruel Favors, M.D.    Assessment: Overall, she looks and feels better but still is having high fevers. The formal report from her lumbar puncture notes that the fluid returned " blood tinged". This raises the possibility of a traumatic tap but I still cannot rule out the possibility of partially treated meningitis. Her blood culture at [facility] grew staph hominis, and insignificant contaminant in one set. Her blood cultures here are negative. So far viral studies are negative. I will continue her current antibiotics and check a Panorex.  Plan: 1. Continue current antibiotics 2. Panorex  Cliffton Asters, MD [facility] [phone] pager   [phone] cell 08/30/2011, 12:58 PM      and

## 2011-08-31 LAB — HEPATIC FUNCTION PANEL
ALT: 43 U/L — ABNORMAL HIGH (ref 0–35)
AST: 31 U/L (ref 0–37)
Albumin: 2.7 g/dL — ABNORMAL LOW (ref 3.5–5.2)
Alkaline Phosphatase: 50 U/L (ref 39–117)
Total Protein: 6.1 g/dL (ref 6.0–8.3)

## 2011-08-31 LAB — CBC
Hemoglobin: 10.4 g/dL — ABNORMAL LOW (ref 12.0–15.0)
RBC: 3.62 MIL/uL — ABNORMAL LOW (ref 3.87–5.11)

## 2011-08-31 LAB — BASIC METABOLIC PANEL
BUN: 6 mg/dL (ref 6–23)
CO2: 25 mEq/L (ref 19–32)
Calcium: 8 mg/dL — ABNORMAL LOW (ref 8.4–10.5)
Creatinine, Ser: 0.61 mg/dL (ref 0.50–1.10)
Glucose, Bld: 92 mg/dL (ref 70–99)

## 2011-08-31 NOTE — Progress Notes (Signed)
Patient ID: [name] [name], female   DOB: [birth year], 61 y.o.   MRN: [id number]    [facility] for Infectious Disease    Date of Admission:  08/28/2011           Day 4 vancomycin        Day 4 ceftriaxone        Day 4 doxycycline Principal Problem:  *Fever Active Problems:  Headache  Knee pain, right  Back pain  Bacteremia due to Gram-positive bacteria  Hepatitis  Periorbital edema  Facial rash  Blurred vision, right eye  Tremor  Meningitis      . cefTRIAXone (ROCEPHIN)  IV  2 g Intravenous Q12H  . doxycycline (VIBRAMYCIN) IV  100 mg Intravenous Q12H  . enoxaparin (LOVENOX) injection  40 mg Subcutaneous Q24H  . feeding supplement  237 mL Oral TID WC  . gadobenate dimeglumine  15 mL Intravenous Once  . vancomycin  1,250 mg Intravenous Q8H  . DISCONTD: vancomycin  1,250 mg Intravenous BID    Subjective: She states that she is feeling better. She walked in the hallway today for about 15 minutes. However, she states that she still having headache and neck pain and left jaw pain. She rates the pain 5/10. She also states that she is still having some left lower quadrant abdominal pain but no nausea or vomiting. She also states that she had one episode of right arm tremor that was uncontrollable lasting about 2 minutes while trying to sleep last night.  Objective: Temp:  [98.1 F (36.7 C)-99.9 F (37.7 C)] 98.6 F (37 C) (07/06 0605) Pulse Rate:  [87-89] 87  (07/06 0605) Resp:  [18-20] 18  (07/06 0605) BP: (105-121)/(67-78) 106/68 mmHg (07/06 0605) SpO2:  [96 %-98 %] 96 % (07/06 0605)  General: She is smiling and appears comfortable. She is visiting with family. Skin: Her periorbital edema and facial rash has resolved Head and neck: Her neck is supple. There no abnormalities with palpation of her temporal and facial arteries Lungs: Clear Cor: Regular S1-S2 no murmurs Abdomen: Soft and nontender Joints extremities: Normal  Lab Results Lab Results  Component Value Date     WBC 4.8 08/31/2011   HGB 10.4* 08/31/2011   HCT 31.3* 08/31/2011   MCV 86.5 08/31/2011   PLT 186 08/31/2011    Lab Results  Component Value Date   CREATININE 0.61 08/31/2011   BUN 6 08/31/2011   NA 139 08/31/2011   K 3.7 08/31/2011   CL 106 08/31/2011   CO2 25 08/31/2011    Lab Results  Component Value Date   ALT 43* 08/31/2011   AST 31 08/31/2011   ALKPHOS 50 08/31/2011   BILITOT 0.2* 08/31/2011      Microbiology: Recent Results (from the past 240 hour(s))  CULTURE, BLOOD (ROUTINE X 2)     Status: Normal (Preliminary result)   Collection Time   08/29/11  1:00 AM      Component Value Range Status Comment   Specimen Description BLOOD LEFT ARM   Final    Special Requests BOTTLES DRAWN AEROBIC AND ANAEROBIC 10CC EACH   Final    Culture  Setup Time 08/29/2011 16:50   Final    Culture     Final    Value:        BLOOD CULTURE RECEIVED NO GROWTH TO DATE CULTURE WILL BE HELD FOR 5 DAYS BEFORE ISSUING A FINAL NEGATIVE REPORT   Report Status PENDING   Incomplete   CULTURE, BLOOD (  ROUTINE X 2)     Status: Normal (Preliminary result)   Collection Time   08/29/11  1:10 AM      Component Value Range Status Comment   Specimen Description BLOOD RIGHT HAND   Final    Special Requests BOTTLES DRAWN AEROBIC AND ANAEROBIC 5CC EACH   Final    Culture  Setup Time 08/29/2011 16:50   Final    Culture     Final    Value:        BLOOD CULTURE RECEIVED NO GROWTH TO DATE CULTURE WILL BE HELD FOR 5 DAYS BEFORE ISSUING A FINAL NEGATIVE REPORT   Report Status PENDING   Incomplete   URINE CULTURE     Status: Normal   Collection Time   08/29/11  2:53 AM      Component Value Range Status Comment   Specimen Description URINE, CLEAN CATCH   Final    Special Requests NONE   Final    Culture  Setup Time 08/29/2011 19:29   Final    Colony Count NO GROWTH   Final    Culture NO GROWTH   Final    Report Status 08/30/2011 FINAL   Final   CSF CULTURE     Status: Normal (Preliminary result)   Collection Time   08/29/11 11:41 AM       Component Value Range Status Comment   Specimen Description CSF   Final    Special Requests TUBE 2 2.1CC   Final    Gram Stain     Final    Value: CYTOSPIN WBC PRESENT,BOTH PMN AND MONONUCLEAR     NO ORGANISMS SEEN     Performed at [facility]   Culture NO GROWTH 2 DAYS   Final    Report Status PENDING   Incomplete   GRAM STAIN     Status: Normal   Collection Time   08/29/11 11:41 AM      Component Value Range Status Comment   Specimen Description CSF   Final    Special Requests TUBE 2 2.1CC   Final    Gram Stain     Final    Value: CYTOSPIN PREP     WBC PRESENT,BOTH PMN AND MONONUCLEAR     NO ORGANISMS SEEN   Report Status 08/29/2011 FINAL   Final     Studies/Results: Dg Orthopantogram  08/30/2011  *RADIOLOGY REPORT*  Clinical Data: Left mandibular dental pain.  ORTHOPANTOGRAM/PANORAMIC  Comparison: None.  Findings: Only 2 teeth remain in the left side of the mandible, and there is no evidence of periapical lucency or caries involving these teeth.  No acute dental disease is identified elsewhere in the mandible or maxilla.  Temporomandibular joints intact.  No intrinsic osseous abnormalities.  IMPRESSION: No evidence of left mandibular dental disease or abnormalities elsewhere to explain left jaw pain.  Original Report Authenticated By: Arnell Sieving, M.D.   Ct Chest Wo Contrast  08/30/2011  *RADIOLOGY REPORT*  Clinical Data: Persistent fever.  Evaluate for lymphadenopathy.  CT CHEST WITHOUT CONTRAST  Technique:  Multidetector CT imaging of the chest was performed following the standard protocol without IV contrast.  Comparison: 07/29/2007  Findings: There is no axillary, mediastinal, or hilar lymphadenopathy.  The heart is enlarged.  Trace pericardial fluid or thickening is evident.  Lung windows show no evidence for pulmonary edema.  There is no parenchymal nodule or mass. No focal airspace consolidation. Dependent atelectasis is seen in both lower lobes.  Bone windows  reveal  no worrisome lytic or sclerotic osseous lesions.  IMPRESSION: No evidence for lymphadenopathy in the chest.  Original Report Authenticated By: ERIC A. MANSELL, M.D.   Mr Laqueta Jean Wo Contrast  08/30/2011  *RADIOLOGY REPORT*  Clinical Data: Persistent headache and fever  MRI HEAD WITHOUT AND WITH CONTRAST  Technique:  Multiplanar, multiecho pulse sequences of the brain and surrounding structures were obtained according to standard protocol without and with intravenous contrast  Contrast: 15mL MULTIHANCE GADOBENATE DIMEGLUMINE 529 MG/ML IV SOLN  Comparison: CT 08/29/2011  Findings: Diffusion imaging does not show any acute or subacute infarction.  The brainstem and cerebellum are normal.  The cerebral hemispheres are normal except for a few small foci of T2 and FLAIR signal within the hemispheric white matter of the frontal lobes. In a person of this age, these are probably not significant.  No mass lesion, hemorrhage, hydrocephalus or extra-axial collection. No abnormal contrast enhancement.  There is arachnoid herniation into the sella with compression of the pituitary tissue along the floor.  Usually, this is not clinically significant.  Sinuses, middle ears and mastoids are clear.  Skull and skull base appear unremarkable.  IMPRESSION: No acute or significant finding.  Normal study except for a few white matter foci in the frontal lobes, probably subclinical. Incidental arachnoid herniation into the sella.  Original Report Authenticated By: Thomasenia Sales, M.D.    Assessment: If still no explanation for her recent febrile illness. She does seem to be improving clinically and her fever and liver enzymes are declining. I will continue current antibiotic therapy and followup tomorrow. I cannot be actually certain what to make of the CSF pleocytosis but I am most suspicious of a traumatic tap.  Plan: 1. Continue current antibiotics  Cliffton Asters, MD [facility] [phone] pager   [phone] cell 08/31/2011, 1:39 PM

## 2011-08-31 NOTE — Progress Notes (Signed)
PATIENT DETAILS Name: [name] [name] Age: 61 y.o. Sex: female Date of Birth: 08-06-1950 Admit Date: 08/28/2011 [id number], Caryn Bee, MD  Subjective: No fever overnight Feels better this am-compared to last few days  Objective: Vital signs in last 24 hours: Filed Vitals:   08/30/11 1448 08/30/11 1610 08/30/11 2212 08/31/11 0605  BP: 121/78  105/67 106/68  Pulse:   89 87  Temp: 98.1 F (36.7 C) 99.9 F (37.7 C) 98.9 F (37.2 C) 98.6 F (37 C)  TempSrc: Oral Rectal  Oral  Resp: 20  18 18   Height:      Weight:      SpO2: 98%  96% 96%    Weight change:   Body mass index is 24.14 kg/(m^2).  Intake/Output from previous day:  Intake/Output Summary (Last 24 hours) at 08/31/11 1045 Last data filed at 08/31/11 0548  Gross per 24 hour  Intake 2858.4 ml  Output      0 ml  Net 2858.4 ml    PHYSICAL EXAM: Gen Exam: Awake and alert with clear speech.   Neck: Supple-with tenderness exhibited with flexion of neck, No JVD.  No meningeal signs  Chest: B/L Clear.  No rales or rhonchi CVS: S1 S2 Regular, no murmurs.  Abdomen: soft, BS +, non tender, non distended.  Extremities: no edema, lower extremities warm to touch. Neurologic: Non Focal.   Skin: No Rash.   Wounds: N/A.    CONSULTS:  None  LAB RESULTS: CBC  Lab 08/31/11 0640 08/30/11 0710 08/29/11 0100  WBC 4.8 7.6 5.1  HGB 10.4* 11.5* 12.1  HCT 31.3* 34.9* 35.8*  PLT 186 171 166  MCV 86.5 87.5 86.7  MCH 28.7 28.8 29.3  MCHC 33.2 33.0 33.8  RDW 15.1 15.0 14.9  LYMPHSABS -- -- 0.9  MONOABS -- -- 0.1  EOSABS -- -- 0.0  BASOSABS -- -- 0.0  BANDABS -- -- --    Chemistries   Lab 08/31/11 0640 08/30/11 0710 08/29/11 0100  NA 139 137 140  K 3.7 3.6 4.3  CL 106 101 102  CO2 25 26 25   GLUCOSE 92 103* 138*  BUN 6 6 9   CREATININE 0.61 0.68 0.81  CALCIUM 8.0* 8.1* 8.3*  MG -- -- --    CBG: No results found for this basename: GLUCAP:5 in the last 168 hours  GFR Estimated Creatinine Clearance: 73.6 ml/min (by  C-G formula based on Cr of 0.61).  Coagulation profile  Lab 08/29/11 0900  INR 1.29  PROTIME --    Cardiac Enzymes No results found for this basename: CK:3,CKMB:3,TROPONINI:3,MYOGLOBIN:3 in the last 168 hours  No components found with this basename: POCBNP:3 No results found for this basename: DDIMER:2 in the last 72 hours No results found for this basename: HGBA1C:2 in the last 72 hours No results found for this basename: CHOL:2,HDL:2,LDLCALC:2,TRIG:2,CHOLHDL:2,LDLDIRECT:2 in the last 72 hours No results found for this basename: TSH,T4TOTAL,FREET3,T3FREE,THYROIDAB in the last 72 hours No results found for this basename: VITAMINB12:2,FOLATE:2,FERRITIN:2,TIBC:2,IRON:2,RETICCTPCT:2 in the last 72 hours No results found for this basename: LIPASE:2,AMYLASE:2 in the last 72 hours  Urine Studies No results found for this basename: UACOL:2,UAPR:2,USPG:2,UPH:2,UTP:2,UGL:2,UKET:2,UBIL:2,UHGB:2,UNIT:2,UROB:2,ULEU:2,UEPI:2,UWBC:2,URBC:2,UBAC:2,CAST:2,CRYS:2,UCOM:2,BILUA:2 in the last 72 hours  MICROBIOLOGY: Recent Results (from the past 240 hour(s))  CULTURE, BLOOD (ROUTINE X 2)     Status: Normal (Preliminary result)   Collection Time   08/29/11  1:00 AM      Component Value Range Status Comment   Specimen Description BLOOD LEFT ARM   Final    Special Requests BOTTLES  DRAWN AEROBIC AND ANAEROBIC 10CC EACH   Final    Culture  Setup Time 08/29/2011 16:50   Final    Culture     Final    Value:        BLOOD CULTURE RECEIVED NO GROWTH TO DATE CULTURE WILL BE HELD FOR 5 DAYS BEFORE ISSUING A FINAL NEGATIVE REPORT   Report Status PENDING   Incomplete   CULTURE, BLOOD (ROUTINE X 2)     Status: Normal (Preliminary result)   Collection Time   08/29/11  1:10 AM      Component Value Range Status Comment   Specimen Description BLOOD RIGHT HAND   Final    Special Requests BOTTLES DRAWN AEROBIC AND ANAEROBIC 5CC EACH   Final    Culture  Setup Time 08/29/2011 16:50   Final    Culture     Final    Value:         BLOOD CULTURE RECEIVED NO GROWTH TO DATE CULTURE WILL BE HELD FOR 5 DAYS BEFORE ISSUING A FINAL NEGATIVE REPORT   Report Status PENDING   Incomplete   URINE CULTURE     Status: Normal   Collection Time   08/29/11  2:53 AM      Component Value Range Status Comment   Specimen Description URINE, CLEAN CATCH   Final    Special Requests NONE   Final    Culture  Setup Time 08/29/2011 19:29   Final    Colony Count NO GROWTH   Final    Culture NO GROWTH   Final    Report Status 08/30/2011 FINAL   Final   CSF CULTURE     Status: Normal (Preliminary result)   Collection Time   08/29/11 11:41 AM      Component Value Range Status Comment   Specimen Description CSF   Final    Special Requests TUBE 2 2.1CC   Final    Gram Stain     Final    Value: CYTOSPIN WBC PRESENT,BOTH PMN AND MONONUCLEAR     NO ORGANISMS SEEN     Performed at [facility]   Culture NO GROWTH 1 DAY   Final    Report Status PENDING   Incomplete   GRAM STAIN     Status: Normal   Collection Time   08/29/11 11:41 AM      Component Value Range Status Comment   Specimen Description CSF   Final    Special Requests TUBE 2 2.1CC   Final    Gram Stain     Final    Value: CYTOSPIN PREP     WBC PRESENT,BOTH PMN AND MONONUCLEAR     NO ORGANISMS SEEN   Report Status 08/29/2011 FINAL   Final     RADIOLOGY STUDIES/RESULTS: Ct Head Wo Contrast  08/29/2011  *RADIOLOGY REPORT*  Clinical Data: Right-sided headache  CT HEAD WITHOUT CONTRAST  Technique:  Contiguous axial images were obtained from the base of the skull through the vertex without contrast.  Comparison: CT brain of 08/27/2011  Findings: The ventricular system remains stable in size and the duration, and the septum is in a normal midline position.  The fourth ventricle and basilar cisterns are stable.  No hemorrhage, mass lesion, or acute infarction is seen.  On bone window images, there may be an empty sella present of questionable clinical significance.  No calvarial  abnormality is seen.  IMPRESSION: Negative unenhanced CT of the brain.  Original Report Authenticated By:  Juline Patch, M.D.   Dg Chest Port 1 View  08/29/2011  *RADIOLOGY REPORT*  Clinical Data: Chest and back pain  PORTABLE CHEST - 1 VIEW  Comparison: Chest x-ray of 08/27/2011  Findings: The lungs are clear.  The heart is borderline enlarged. There are degenerative changes throughout the thoracic spine.  IMPRESSION: No active lung disease.  Borderline cardiomegaly.  Original Report Authenticated By: Juline Patch, M.D.    MEDICATIONS: Scheduled Meds:    . cefTRIAXone (ROCEPHIN)  IV  2 g Intravenous Q12H  . doxycycline (VIBRAMYCIN) IV  100 mg Intravenous Q12H  . enoxaparin (LOVENOX) injection  40 mg Subcutaneous Q24H  . feeding supplement  237 mL Oral TID WC  . gadobenate dimeglumine  15 mL Intravenous Once  . vancomycin  1,250 mg Intravenous Q8H  . DISCONTD: vancomycin  1,250 mg Intravenous BID   Continuous Infusions:    . sodium chloride 100 mL/hr at 08/31/11 0500   PRN Meds:.acetaminophen, acetaminophen, ibuprofen, ondansetron (ZOFRAN) IV, ondansetron  Antibiotics: Anti-infectives     Start     Dose/Rate Route Frequency Ordered Stop   08/31/11 0600   vancomycin (VANCOCIN) 1,250 mg in sodium chloride 0.9 % 250 mL IVPB        1,250 mg 166.7 mL/hr over 90 Minutes Intravenous Every 8 hours 08/30/11 2254     08/29/11 0400   vancomycin (VANCOCIN) 1,250 mg in sodium chloride 0.9 % 250 mL IVPB  Status:  Discontinued        1,250 mg 166.7 mL/hr over 90 Minutes Intravenous 2 times daily 08/29/11 0145 08/30/11 2254   08/29/11 0230   cefTRIAXone (ROCEPHIN) 2 g in dextrose 5 % 50 mL IVPB        2 g 100 mL/hr over 30 Minutes Intravenous Every 12 hours 08/29/11 0145     08/29/11 0130   doxycycline (VIBRAMYCIN) 100 mg in dextrose 5 % 250 mL IVPB        100 mg 125 mL/hr over 120 Minutes Intravenous Every 12 hours 08/29/11 0110            Assessment/Plan: Principal Problem:   *Fever -?viral syndrome with viral meningitis -s/p LP-7/4-CSF-with normal protein and glucose -CSF cultures neg so far -Urine and Blood cultures done on 08/29/11 -neg so far -on IV Vanco/Rocephin and Doxy-started 7/4-will defer whether to further taper to ID -CT chest-negative for lymphadenopathy -HIV neg -ANA neg -Mono screen-neg -HSV-neg -Lymes/RMSF titres-pending   Headache -?2/2 Meningitis -MRI brain-negatve  Transaminitis -almost resolved -Hepatitis serology neg  Right upper back pain -going on 4 weeks or so -if blood cultures positive for definite bugs-then this may be an embolic site and may need further imaging  Right knee OA -benign knee exam -as needed Tylenol or NSAIDS  Disposition: -remain inpatient  DVT Prophylaxis: -start prophylactic lovenox  Code Status: Full Code  Jeoffrey Massed, MD  [facility] Pager [phone]  If 7PM-7AM, please contact night-coverage [email or web address] Password [email or web address] 08/31/2011, 10:45 AM   LOS: 3 days

## 2011-09-01 MED ORDER — DOXYCYCLINE HYCLATE 100 MG PO TABS
100.0000 mg | ORAL_TABLET | Freq: Two times a day (BID) | ORAL | Status: DC
  Administered 2011-09-01 – 2011-09-02 (×3): 100 mg via ORAL
  Filled 2011-09-01 (×5): qty 1

## 2011-09-01 NOTE — Progress Notes (Signed)
ANTIBIOTIC CONSULT NOTE - FOLLOW UP  Pharmacy Consult for Vancomycin  Indication: r/o meningitis  Allergies  Allergen Reactions  . Iodine Swelling  . Iohexol      Desc: FACIAL SWELLING   . Penicillins Itching and Swelling  . Shellfish Allergy Swelling  . Sulfa Drugs Cross Reactors Nausea And Vomiting    Patient Measurements: Height: 5\' 7"  (170.2 cm) Weight: 154 lb 1.6 oz (69.9 kg) IBW/kg (Calculated) : 61.6   Vital Signs: Temp: 98.6 F (37 C) (07/07 0540) Temp src: Oral (07/07 0540) BP: 131/79 mmHg (07/07 0540) Pulse Rate: 81  (07/07 0540) Intake/Output from previous day: 07/06 0701 - 07/07 0700 In: 340 [P.O.:340] Out: -  Intake/Output from this shift:    Labs:  Basename 08/31/11 0640 08/30/11 0710  WBC 4.8 7.6  HGB 10.4* 11.5*  PLT 186 171  LABCREA -- --  CREATININE 0.61 0.68   Estimated Creatinine Clearance: 73.6 ml/min (by C-G formula based on Cr of 0.61).  Basename 09/01/11 0640 08/30/11 1936  VANCOTROUGH 18.8 12.5  VANCOPEAK -- --  Drue Dun -- --  GENTTROUGH -- --  GENTPEAK -- --  GENTRANDOM -- --  TOBRATROUGH -- --  TOBRAPEAK -- --  TOBRARND -- --  AMIKACINPEAK -- --  AMIKACINTROU -- --  AMIKACIN -- --     Microbiology: Recent Results (from the past 720 hour(s))  CULTURE, BLOOD (ROUTINE X 2)     Status: Normal (Preliminary result)   Collection Time   08/29/11  1:00 AM      Component Value Range Status Comment   Specimen Description BLOOD LEFT ARM   Final    Special Requests BOTTLES DRAWN AEROBIC AND ANAEROBIC 10CC EACH   Final    Culture  Setup Time 08/29/2011 16:50   Final    Culture     Final    Value:        BLOOD CULTURE RECEIVED NO GROWTH TO DATE CULTURE WILL BE HELD FOR 5 DAYS BEFORE ISSUING A FINAL NEGATIVE REPORT   Report Status PENDING   Incomplete   CULTURE, BLOOD (ROUTINE X 2)     Status: Normal (Preliminary result)   Collection Time   08/29/11  1:10 AM      Component Value Range Status Comment   Specimen Description BLOOD  RIGHT HAND   Final    Special Requests BOTTLES DRAWN AEROBIC AND ANAEROBIC 5CC EACH   Final    Culture  Setup Time 08/29/2011 16:50   Final    Culture     Final    Value:        BLOOD CULTURE RECEIVED NO GROWTH TO DATE CULTURE WILL BE HELD FOR 5 DAYS BEFORE ISSUING A FINAL NEGATIVE REPORT   Report Status PENDING   Incomplete   URINE CULTURE     Status: Normal   Collection Time   08/29/11  2:53 AM      Component Value Range Status Comment   Specimen Description URINE, CLEAN CATCH   Final    Special Requests NONE   Final    Culture  Setup Time 08/29/2011 19:29   Final    Colony Count NO GROWTH   Final    Culture NO GROWTH   Final    Report Status 08/30/2011 FINAL   Final   CSF CULTURE     Status: Normal (Preliminary result)   Collection Time   08/29/11 11:41 AM      Component Value Range Status Comment   Specimen Description CSF  Final    Special Requests TUBE 2 2.1CC   Final    Gram Stain     Final    Value: CYTOSPIN WBC PRESENT,BOTH PMN AND MONONUCLEAR     NO ORGANISMS SEEN     Performed at [facility]   Culture NO GROWTH 2 DAYS   Final    Report Status PENDING   Incomplete   GRAM STAIN     Status: Normal   Collection Time   08/29/11 11:41 AM      Component Value Range Status Comment   Specimen Description CSF   Final    Special Requests TUBE 2 2.1CC   Final    Gram Stain     Final    Value: CYTOSPIN PREP     WBC PRESENT,BOTH PMN AND MONONUCLEAR     NO ORGANISMS SEEN   Report Status 08/29/2011 FINAL   Final     Anti-infectives     Start     Dose/Rate Route Frequency Ordered Stop   08/31/11 0600   vancomycin (VANCOCIN) 1,250 mg in sodium chloride 0.9 % 250 mL IVPB        1,250 mg 166.7 mL/hr over 90 Minutes Intravenous Every 8 hours 08/30/11 2254     08/29/11 0400   vancomycin (VANCOCIN) 1,250 mg in sodium chloride 0.9 % 250 mL IVPB  Status:  Discontinued        1,250 mg 166.7 mL/hr over 90 Minutes Intravenous 2 times daily 08/29/11 0145 08/30/11 2254   08/29/11  0230   cefTRIAXone (ROCEPHIN) 2 g in dextrose 5 % 50 mL IVPB        2 g 100 mL/hr over 30 Minutes Intravenous Every 12 hours 08/29/11 0145     08/29/11 0130   doxycycline (VIBRAMYCIN) 100 mg in dextrose 5 % 250 mL IVPB        100 mg 125 mL/hr over 120 Minutes Intravenous Every 12 hours 08/29/11 0110            Assessment: 61 yo F on vancomycin, Rocephin and doxycycline for r/o meningitis. MC cx data remains NGTD, however pt did have 1/2 postitive gpc blood cx at [facility] (suspected contaminant per ID). WBC are WNL and patient is afebrile.  Vanc trough is therapeutic on 1250mg  IV q8h.    Goal of Therapy:  Vancomycin trough level 15-20 mcg/ml  Plan:  1.  Continue vancomycin 1250mg  IV q8h 2.  Plan to check vanc troughs weekly unless clinically needed  3.  F/U cultures, renal function, and clinical progress  Lillia Pauls, PharmD Clinical Pharmacist Pager: [phone] Phone: [phone] 09/01/2011 9:15 AM

## 2011-09-01 NOTE — Progress Notes (Signed)
PATIENT DETAILS Name: [name] [name] Age: 61 y.o. Sex: female Date of Birth: Apr 17, 1950 Admit Date: 08/28/2011 [id number], Caryn Bee, MD  Subjective: No fever overnight, still with right sided headache-however not as bad in intensity as compared to last few days  Objective: Vital signs in last 24 hours: Filed Vitals:   08/31/11 0605 08/31/11 1400 08/31/11 2142 09/01/11 0540  BP: 106/68 123/79 114/75 131/79  Pulse: 87 82 86 81  Temp: 98.6 F (37 C) 98.1 F (36.7 C) 98.8 F (37.1 C) 98.6 F (37 C)  TempSrc: Oral Oral Oral Oral  Resp: 18 18 18 18   Height:      Weight:      SpO2: 96% 97% 98% 98%    Weight change:   Body mass index is 24.14 kg/(m^2).  Intake/Output from previous day:  Intake/Output Summary (Last 24 hours) at 09/01/11 0855 Last data filed at 08/31/11 2000  Gross per 24 hour  Intake    340 ml  Output      0 ml  Net    340 ml    PHYSICAL EXAM: Gen Exam: Awake and alert with clear speech.   Neck: Supple-with tenderness exhibited with flexion of neck, No JVD.  No meningeal signs  Chest: B/L Clear.  No rales or rhonchi CVS: S1 S2 Regular, no murmurs.  Abdomen: soft, BS +, non tender, non distended.  Extremities: no edema, lower extremities warm to touch. Neurologic: Non Focal.   Skin: No Rash.   Wounds: N/A.    CONSULTS:  None  LAB RESULTS: CBC  Lab 08/31/11 0640 08/30/11 0710 08/29/11 0100  WBC 4.8 7.6 5.1  HGB 10.4* 11.5* 12.1  HCT 31.3* 34.9* 35.8*  PLT 186 171 166  MCV 86.5 87.5 86.7  MCH 28.7 28.8 29.3  MCHC 33.2 33.0 33.8  RDW 15.1 15.0 14.9  LYMPHSABS -- -- 0.9  MONOABS -- -- 0.1  EOSABS -- -- 0.0  BASOSABS -- -- 0.0  BANDABS -- -- --    Chemistries   Lab 08/31/11 0640 08/30/11 0710 08/29/11 0100  NA 139 137 140  K 3.7 3.6 4.3  CL 106 101 102  CO2 25 26 25   GLUCOSE 92 103* 138*  BUN 6 6 9   CREATININE 0.61 0.68 0.81  CALCIUM 8.0* 8.1* 8.3*  MG -- -- --    CBG: No results found for this basename: GLUCAP:5 in the last 168  hours  GFR Estimated Creatinine Clearance: 73.6 ml/min (by C-G formula based on Cr of 0.61).  Coagulation profile  Lab 08/29/11 0900  INR 1.29  PROTIME --    Cardiac Enzymes No results found for this basename: CK:3,CKMB:3,TROPONINI:3,MYOGLOBIN:3 in the last 168 hours  No components found with this basename: POCBNP:3 No results found for this basename: DDIMER:2 in the last 72 hours No results found for this basename: HGBA1C:2 in the last 72 hours No results found for this basename: CHOL:2,HDL:2,LDLCALC:2,TRIG:2,CHOLHDL:2,LDLDIRECT:2 in the last 72 hours No results found for this basename: TSH,T4TOTAL,FREET3,T3FREE,THYROIDAB in the last 72 hours No results found for this basename: VITAMINB12:2,FOLATE:2,FERRITIN:2,TIBC:2,IRON:2,RETICCTPCT:2 in the last 72 hours No results found for this basename: LIPASE:2,AMYLASE:2 in the last 72 hours  Urine Studies No results found for this basename: UACOL:2,UAPR:2,USPG:2,UPH:2,UTP:2,UGL:2,UKET:2,UBIL:2,UHGB:2,UNIT:2,UROB:2,ULEU:2,UEPI:2,UWBC:2,URBC:2,UBAC:2,CAST:2,CRYS:2,UCOM:2,BILUA:2 in the last 72 hours  MICROBIOLOGY: Recent Results (from the past 240 hour(s))  CULTURE, BLOOD (ROUTINE X 2)     Status: Normal (Preliminary result)   Collection Time   08/29/11  1:00 AM      Component Value Range Status Comment  Specimen Description BLOOD LEFT ARM   Final    Special Requests BOTTLES DRAWN AEROBIC AND ANAEROBIC 10CC EACH   Final    Culture  Setup Time 08/29/2011 16:50   Final    Culture     Final    Value:        BLOOD CULTURE RECEIVED NO GROWTH TO DATE CULTURE WILL BE HELD FOR 5 DAYS BEFORE ISSUING A FINAL NEGATIVE REPORT   Report Status PENDING   Incomplete   CULTURE, BLOOD (ROUTINE X 2)     Status: Normal (Preliminary result)   Collection Time   08/29/11  1:10 AM      Component Value Range Status Comment   Specimen Description BLOOD RIGHT HAND   Final    Special Requests BOTTLES DRAWN AEROBIC AND ANAEROBIC 5CC EACH   Final    Culture  Setup  Time 08/29/2011 16:50   Final    Culture     Final    Value:        BLOOD CULTURE RECEIVED NO GROWTH TO DATE CULTURE WILL BE HELD FOR 5 DAYS BEFORE ISSUING A FINAL NEGATIVE REPORT   Report Status PENDING   Incomplete   URINE CULTURE     Status: Normal   Collection Time   08/29/11  2:53 AM      Component Value Range Status Comment   Specimen Description URINE, CLEAN CATCH   Final    Special Requests NONE   Final    Culture  Setup Time 08/29/2011 19:29   Final    Colony Count NO GROWTH   Final    Culture NO GROWTH   Final    Report Status 08/30/2011 FINAL   Final   CSF CULTURE     Status: Normal (Preliminary result)   Collection Time   08/29/11 11:41 AM      Component Value Range Status Comment   Specimen Description CSF   Final    Special Requests TUBE 2 2.1CC   Final    Gram Stain     Final    Value: CYTOSPIN WBC PRESENT,BOTH PMN AND MONONUCLEAR     NO ORGANISMS SEEN     Performed at [facility]   Culture NO GROWTH 2 DAYS   Final    Report Status PENDING   Incomplete   GRAM STAIN     Status: Normal   Collection Time   08/29/11 11:41 AM      Component Value Range Status Comment   Specimen Description CSF   Final    Special Requests TUBE 2 2.1CC   Final    Gram Stain     Final    Value: CYTOSPIN PREP     WBC PRESENT,BOTH PMN AND MONONUCLEAR     NO ORGANISMS SEEN   Report Status 08/29/2011 FINAL   Final     RADIOLOGY STUDIES/RESULTS: Ct Head Wo Contrast  08/29/2011  *RADIOLOGY REPORT*  Clinical Data: Right-sided headache  CT HEAD WITHOUT CONTRAST  Technique:  Contiguous axial images were obtained from the base of the skull through the vertex without contrast.  Comparison: CT brain of 08/27/2011  Findings: The ventricular system remains stable in size and the duration, and the septum is in a normal midline position.  The fourth ventricle and basilar cisterns are stable.  No hemorrhage, mass lesion, or acute infarction is seen.  On bone window images, there may be an empty sella  present of questionable clinical significance.  No calvarial abnormality is  seen.  IMPRESSION: Negative unenhanced CT of the brain.  Original Report Authenticated By: Juline Patch, M.D.   Dg Chest Port 1 View  08/29/2011  *RADIOLOGY REPORT*  Clinical Data: Chest and back pain  PORTABLE CHEST - 1 VIEW  Comparison: Chest x-ray of 08/27/2011  Findings: The lungs are clear.  The heart is borderline enlarged. There are degenerative changes throughout the thoracic spine.  IMPRESSION: No active lung disease.  Borderline cardiomegaly.  Original Report Authenticated By: Juline Patch, M.D.    MEDICATIONS: Scheduled Meds:    . cefTRIAXone (ROCEPHIN)  IV  2 g Intravenous Q12H  . doxycycline (VIBRAMYCIN) IV  100 mg Intravenous Q12H  . enoxaparin (LOVENOX) injection  40 mg Subcutaneous Q24H  . feeding supplement  237 mL Oral TID WC  . vancomycin  1,250 mg Intravenous Q8H   Continuous Infusions:    . sodium chloride 40 mL/hr at 08/31/11 1615   PRN Meds:.acetaminophen, acetaminophen, ibuprofen, ondansetron (ZOFRAN) IV, ondansetron  Antibiotics: Anti-infectives     Start     Dose/Rate Route Frequency Ordered Stop   08/31/11 0600   vancomycin (VANCOCIN) 1,250 mg in sodium chloride 0.9 % 250 mL IVPB        1,250 mg 166.7 mL/hr over 90 Minutes Intravenous Every 8 hours 08/30/11 2254     08/29/11 0400   vancomycin (VANCOCIN) 1,250 mg in sodium chloride 0.9 % 250 mL IVPB  Status:  Discontinued        1,250 mg 166.7 mL/hr over 90 Minutes Intravenous 2 times daily 08/29/11 0145 08/30/11 2254   08/29/11 0230   cefTRIAXone (ROCEPHIN) 2 g in dextrose 5 % 50 mL IVPB        2 g 100 mL/hr over 30 Minutes Intravenous Every 12 hours 08/29/11 0145     08/29/11 0130   doxycycline (VIBRAMYCIN) 100 mg in dextrose 5 % 250 mL IVPB        100 mg 125 mL/hr over 120 Minutes Intravenous Every 12 hours 08/29/11 0110            Assessment/Plan: Principal Problem:  *Fever -?viral syndrome with viral  meningitis -s/p LP-7/4-CSF-with normal protein and glucose -CSF cultures neg so far -Urine and Blood cultures done on 08/29/11 -neg so far -on IV Vanco/Rocephin and Doxy-started 7/4-will defer whether to further taper to ID -CT chest-negative for lymphadenopathy -HIV neg -ANA neg -Mono screen-neg -HSV-neg -Lymes/RMSF titres-pending   Headache -?2/2 Meningitis -MRI brain-negatve  Transaminitis -almost resolved -Hepatitis serology neg  Right upper back pain -going on 4 weeks or so -if blood cultures positive for definite bugs-then this may be an embolic site and may need further imaging  Right knee OA -benign knee exam -as needed Tylenol or NSAIDS  Disposition: -remain inpatient  DVT Prophylaxis: -start prophylactic lovenox  Code Status: Full Code  Jeoffrey Massed, MD  [facility] Pager [phone]  If 7PM-7AM, please contact night-coverage [email or web address] Password [email or web address] 09/01/2011, 8:55 AM   LOS: 4 days

## 2011-09-01 NOTE — Progress Notes (Signed)
Patient ID: [name] [name], female   DOB: 1950-11-26, 61 y.o.   MRN: [id number]    [facility]    Date of Admission:  08/28/2011           Day 5 vancomycin        Day 5 ceftriaxone        Day 5 doxycycline Principal Problem:  *Fever Active Problems:  Headache  Knee pain, right  Back pain  Bacteremia due to Gram-positive bacteria  Hepatitis  Periorbital edema  Facial rash  Blurred vision, right eye  Tremor  Meningitis      . cefTRIAXone (ROCEPHIN)  IV  2 g Intravenous Q12H  . doxycycline (VIBRAMYCIN) IV  100 mg Intravenous Q12H  . enoxaparin (LOVENOX) injection  40 mg Subcutaneous Q24H  . feeding supplement  237 mL Oral TID WC  . vancomycin  1,250 mg Intravenous Q8H    Subjective: She is feeling better. She still has some very mild right-sided headache with some very mild blurred vision right eye. Her husband states that she had one brief episode of right hand shaking last night it was not as violent as the one on the night of admission. She has not had any more abdominal pain, nausea or vomiting. She is complaining of feeling constipated.  Objective: Temp:  [98.1 F (36.7 C)-98.8 F (37.1 C)] 98.6 F (37 C) (07/07 0540) Pulse Rate:  [81-86] 81  (07/07 0540) Resp:  [18] 18  (07/07 0540) BP: (114-131)/(75-79) 131/79 mmHg (07/07 0540) SpO2:  [97 %-98 %] 98 % (07/07 0540)  General: She is smiling and up walking in the hallway with her husband Skin: Her facial rash is resolved her periorbital edema is gone Lungs: Clear Cor: Regular S1 and S2 with no murmurs Abdomen: Soft and nontender Neuro: She has no hand tremor other focal abnormalities  Lab Results Lab Results  Component Value Date   WBC 4.8 08/31/2011   HGB 10.4* 08/31/2011   HCT 31.3* 08/31/2011   MCV 86.5 08/31/2011   PLT 186 08/31/2011    Lab Results  Component Value Date   CREATININE 0.61 08/31/2011   BUN 6 08/31/2011   NA 139 08/31/2011   K 3.7 08/31/2011   CL 106 08/31/2011   CO2 25  08/31/2011    Lab Results  Component Value Date   ALT 43* 08/31/2011   AST 31 08/31/2011   ALKPHOS 50 08/31/2011   BILITOT 0.2* 08/31/2011      Microbiology: Recent Results (from the past 240 hour(s))  CULTURE, BLOOD (ROUTINE X 2)     Status: Normal (Preliminary result)   Collection Time   08/29/11  1:00 AM      Component Value Range Status Comment   Specimen Description BLOOD LEFT ARM   Final    Special Requests BOTTLES DRAWN AEROBIC AND ANAEROBIC 10CC EACH   Final    Culture  Setup Time 08/29/2011 16:50   Final    Culture     Final    Value:        BLOOD CULTURE RECEIVED NO GROWTH TO DATE CULTURE WILL BE HELD FOR 5 DAYS BEFORE ISSUING A FINAL NEGATIVE REPORT   Report Status PENDING   Incomplete   CULTURE, BLOOD (ROUTINE X 2)     Status: Normal (Preliminary result)   Collection Time   08/29/11  1:10 AM      Component Value Range Status Comment   Specimen Description BLOOD RIGHT HAND   Final  Special Requests BOTTLES DRAWN AEROBIC AND ANAEROBIC 5CC EACH   Final    Culture  Setup Time 08/29/2011 16:50   Final    Culture     Final    Value:        BLOOD CULTURE RECEIVED NO GROWTH TO DATE CULTURE WILL BE HELD FOR 5 DAYS BEFORE ISSUING A FINAL NEGATIVE REPORT   Report Status PENDING   Incomplete   URINE CULTURE     Status: Normal   Collection Time   08/29/11  2:53 AM      Component Value Range Status Comment   Specimen Description URINE, CLEAN CATCH   Final    Special Requests NONE   Final    Culture  Setup Time 08/29/2011 19:29   Final    Colony Count NO GROWTH   Final    Culture NO GROWTH   Final    Report Status 08/30/2011 FINAL   Final   CSF CULTURE     Status: Normal (Preliminary result)   Collection Time   08/29/11 11:41 AM      Component Value Range Status Comment   Specimen Description CSF   Final    Special Requests TUBE 2 2.1CC   Final    Gram Stain     Final    Value: CYTOSPIN WBC PRESENT,BOTH PMN AND MONONUCLEAR     NO ORGANISMS SEEN     Performed at [facility]    Culture NO GROWTH 3 DAYS   Final    Report Status PENDING   Incomplete   GRAM STAIN     Status: Normal   Collection Time   08/29/11 11:41 AM      Component Value Range Status Comment   Specimen Description CSF   Final    Special Requests TUBE 2 2.1CC   Final    Gram Stain     Final    Value: CYTOSPIN PREP     WBC PRESENT,BOTH PMN AND MONONUCLEAR     NO ORGANISMS SEEN   Report Status 08/29/2011 FINAL   Final     Studies/Results: Dg Orthopantogram  08/30/2011  *RADIOLOGY REPORT*  Clinical Data: Left mandibular dental pain.  ORTHOPANTOGRAM/PANORAMIC  Comparison: None.  Findings: Only 2 teeth remain in the left side of the mandible, and there is no evidence of periapical lucency or caries involving these teeth.  No acute dental disease is identified elsewhere in the mandible or maxilla.  Temporomandibular joints intact.  No intrinsic osseous abnormalities.  IMPRESSION: No evidence of left mandibular dental disease or abnormalities elsewhere to explain left jaw pain.  Original Report Authenticated By: Arnell Sieving, M.D.   Mr Laqueta Jean Wo Contrast  08/30/2011  *RADIOLOGY REPORT*  Clinical Data: Persistent headache and fever  MRI HEAD WITHOUT AND WITH CONTRAST  Technique:  Multiplanar, multiecho pulse sequences of the brain and surrounding structures were obtained according to standard protocol without and with intravenous contrast  Contrast: 15mL MULTIHANCE GADOBENATE DIMEGLUMINE 529 MG/ML IV SOLN  Comparison: CT 08/29/2011  Findings: Diffusion imaging does not show any acute or subacute infarction.  The brainstem and cerebellum are normal.  The cerebral hemispheres are normal except for a few small foci of T2 and FLAIR signal within the hemispheric white matter of the frontal lobes. In a person of this age, these are probably not significant.  No mass lesion, hemorrhage, hydrocephalus or extra-axial collection. No abnormal contrast enhancement.  There is arachnoid herniation into the sella with  compression of  the pituitary tissue along the floor.  Usually, this is not clinically significant.  Sinuses, middle ears and mastoids are clear.  Skull and skull base appear unremarkable.  IMPRESSION: No acute or significant finding.  Normal study except for a few white matter foci in the frontal lobes, probably subclinical. Incidental arachnoid herniation into the sella.  Original Report Authenticated By: Thomasenia Sales, M.D.    Assessment: Are still no clear explanation for her recent febrile illness but she is improving. I suspect that the CSF pleocytosis was caused by a traumatic tap rather than meningitis and I will glad and stop IV vancomycin and ceftriaxone. I will change doxycycline to oral form and continue it 2 more days.  Plan: 1. Continue doxycycline and oral form 2. Discontinue vancomycin and ceftriaxone 3. Consider discharge home tomorrow  Cliffton Asters, MD [facility] [phone] pager   [phone] cell 09/01/2011, 1:04 PM

## 2011-09-02 LAB — B. BURGDORFI ANTIBODIES BY WB
B burgdorferi IgG Abs (IB): NEGATIVE
B burgdorferi IgM Abs (IB): NEGATIVE

## 2011-09-02 LAB — CMV IGM: CMV IgM: 0.3 (ref <0.90)

## 2011-09-02 MED ORDER — KETOROLAC TROMETHAMINE 30 MG/ML IJ SOLN
30.0000 mg | Freq: Once | INTRAMUSCULAR | Status: AC
  Administered 2011-09-02: 30 mg via INTRAMUSCULAR
  Filled 2011-09-02: qty 1

## 2011-09-02 MED ORDER — ACETAMINOPHEN 325 MG PO TABS: 650.0000 mg | ORAL_TABLET | Freq: Four times a day (QID) | ORAL | Status: AC | PRN

## 2011-09-02 MED ORDER — OXYCODONE HCL 5 MG PO TABS: 5.0000 mg | ORAL_TABLET | Freq: Four times a day (QID) | ORAL | Status: AC | PRN

## 2011-09-02 MED ORDER — KETOROLAC TROMETHAMINE 30 MG/ML IJ SOLN
30.0000 mg | Freq: Once | INTRAMUSCULAR | Status: DC
  Filled 2011-09-02: qty 1

## 2011-09-02 NOTE — Progress Notes (Signed)
Critical lab called by lab tech: RMSF IgM 1.18. Dr. Jerral Ralph notified.

## 2011-09-02 NOTE — Progress Notes (Signed)
Patient discharge home Patient stated she had no question from previus RN reviewing discharge instruction IV out and Prescription given to patient. Patient verbalizes the symtoms worsening to come back to ED

## 2011-09-02 NOTE — Discharge Summary (Addendum)
PATIENT DETAILS Name: [name] [name] Age: 61 y.o. Sex: female Date of Birth: [birth year] MRN: [id number]. Admit Date: 08/28/2011 Admitting Physician: Lorane Gell, MD WUJ:[id number], Caryn Bee, MD  PRIMARY DISCHARGE DIAGNOSIS:  Principal Problem:  *Fever-unknown etiology Active Problems:  Headache  Knee pain, right  Back pain  Hepatitis  Periorbital edema  Facial rash  Blurred vision, right eye  Tremor  Meningitis      PAST MEDICAL HISTORY: Past Medical History  Diagnosis Date  . No pertinent past medical history     DISCHARGE MEDICATIONS: Medication List  As of 09/02/2011  4:19 PM   TAKE these medications         acetaminophen 325 MG tablet   Commonly known as: TYLENOL   Take 2 tablets (650 mg total) by mouth every 6 (six) hours as needed for pain (or Fever >/= 101).      b complex vitamins capsule   Take 1 capsule by mouth daily.      folic acid 1 MG tablet   Commonly known as: FOLVITE   Take 1 mg by mouth daily.      ibuprofen 200 MG tablet   Commonly known as: ADVIL,MOTRIN   Take 200 mg by mouth every 6 (six) hours as needed. For fever/pain      oxyCODONE 5 MG immediate release tablet   Commonly known as: Oxy IR/ROXICODONE   Take 1 tablet (5 mg total) by mouth every 6 (six) hours as needed for pain.      VITAMIN B-12 PO   Take 1 tablet by mouth daily.      VITAMIN B-6 PO   Take 1 tablet by mouth daily.      VITAMIN C PO   Take 1 tablet by mouth daily.             BRIEF HPI:  See H&P, Labs, Consult and Test reports for all details in brief, patient was admitted as a transfer from [facility] for persistent fever and headache. She apparently had mild facial swelling along with peri-orbital puffiness.She was admitted for further evaluation and treatment.  CONSULTATIONS:   ID  PERTINENT RADIOLOGIC STUDIES: Dg Orthopantogram  08/30/2011  *RADIOLOGY REPORT*  Clinical Data: Left mandibular dental pain.  ORTHOPANTOGRAM/PANORAMIC  Comparison: None.   Findings: Only 2 teeth remain in the left side of the mandible, and there is no evidence of periapical lucency or caries involving these teeth.  No acute dental disease is identified elsewhere in the mandible or maxilla.  Temporomandibular joints intact.  No intrinsic osseous abnormalities.  IMPRESSION: No evidence of left mandibular dental disease or abnormalities elsewhere to explain left jaw pain.  Original Report Authenticated By: Arnell Sieving, M.D.   Ct Head Wo Contrast  08/29/2011  *RADIOLOGY REPORT*  Clinical Data: Right-sided headache  CT HEAD WITHOUT CONTRAST  Technique:  Contiguous axial images were obtained from the base of the skull through the vertex without contrast.  Comparison: CT brain of 08/27/2011  Findings: The ventricular system remains stable in size and the duration, and the septum is in a normal midline position.  The fourth ventricle and basilar cisterns are stable.  No hemorrhage, mass lesion, or acute infarction is seen.  On bone window images, there may be an empty sella present of questionable clinical significance.  No calvarial abnormality is seen.  IMPRESSION: Negative unenhanced CT of the brain.  Original Report Authenticated By: Juline Patch, M.D.   Ct Chest Wo Contrast  08/30/2011  *RADIOLOGY REPORT*  Clinical Data: Persistent fever.  Evaluate for lymphadenopathy.  CT CHEST WITHOUT CONTRAST  Technique:  Multidetector CT imaging of the chest was performed following the standard protocol without IV contrast.  Comparison: 07/29/2007  Findings: There is no axillary, mediastinal, or hilar lymphadenopathy.  The heart is enlarged.  Trace pericardial fluid or thickening is evident.  Lung windows show no evidence for pulmonary edema.  There is no parenchymal nodule or mass. No focal airspace consolidation. Dependent atelectasis is seen in both lower lobes.  Bone windows reveal no worrisome lytic or sclerotic osseous lesions.  IMPRESSION: No evidence for lymphadenopathy in the  chest.  Original Report Authenticated By: ERIC A. MANSELL, M.D.   Mr Laqueta Jean Wo Contrast  08/30/2011  *RADIOLOGY REPORT*  Clinical Data: Persistent headache and fever  MRI HEAD WITHOUT AND WITH CONTRAST  Technique:  Multiplanar, multiecho pulse sequences of the brain and surrounding structures were obtained according to standard protocol without and with intravenous contrast  Contrast: 15mL MULTIHANCE GADOBENATE DIMEGLUMINE 529 MG/ML IV SOLN  Comparison: CT 08/29/2011  Findings: Diffusion imaging does not show any acute or subacute infarction.  The brainstem and cerebellum are normal.  The cerebral hemispheres are normal except for a few small foci of T2 and FLAIR signal within the hemispheric white matter of the frontal lobes. In a person of this age, these are probably not significant.  No mass lesion, hemorrhage, hydrocephalus or extra-axial collection. No abnormal contrast enhancement.  There is arachnoid herniation into the sella with compression of the pituitary tissue along the floor.  Usually, this is not clinically significant.  Sinuses, middle ears and mastoids are clear.  Skull and skull base appear unremarkable.  IMPRESSION: No acute or significant finding.  Normal study except for a few white matter foci in the frontal lobes, probably subclinical. Incidental arachnoid herniation into the sella.  Original Report Authenticated By: Thomasenia Sales, M.D.   Dg Chest Port 1 View  08/29/2011  *RADIOLOGY REPORT*  Clinical Data: Chest and back pain  PORTABLE CHEST - 1 VIEW  Comparison: Chest x-ray of 08/27/2011  Findings: The lungs are clear.  The heart is borderline enlarged. There are degenerative changes throughout the thoracic spine.  IMPRESSION: No active lung disease.  Borderline cardiomegaly.  Original Report Authenticated By: Juline Patch, M.D.   Dg Fluoro Guide Lumbar Puncture  08/29/2011  *RADIOLOGY REPORT*  Clinical Data:  Headache, fever  DIAGNOSTIC LUMBAR PUNCTURE UNDER FLUOROSCOPIC GUIDANCE   Fluoroscopy time:  0.35 minutes.  Technique:  Informed consent was obtained from the patient prior to the procedure, including potential complications of headache, allergy, and pain.   With the patient prone, the lower back was prepped with Betadine.  1% Lidocaine was used for local anesthesia. Lumbar puncture was performed at the L5 S1 level using a 20 gauge needle with return of blood tinged CSF with an opening pressure of 23 cm water.   10 ml of CSF were obtained for laboratory studies. The patient tolerated the procedure well and there were no apparent complications.  IMPRESSION: Successful fluoroscopic lumbar puncture  Original Report Authenticated By: Judie Petit. Ruel Favors, M.D.     PERTINENT LAB RESULTS: CBC:  Basename 08/31/11 0640  WBC 4.8  HGB 10.4*  HCT 31.3*  PLT 186   CMET CMP     Component Value Date/Time   NA 139 08/31/2011 0640   K 3.7 08/31/2011 0640   CL 106 08/31/2011 0640   CO2 25 08/31/2011 0640   GLUCOSE 92 08/31/2011  0640   BUN 6 08/31/2011 0640   CREATININE 0.61 08/31/2011 0640   CALCIUM 8.0* 08/31/2011 0640   PROT 6.1 08/31/2011 0640   ALBUMIN 2.7* 08/31/2011 0640   AST 31 08/31/2011 0640   ALT 43* 08/31/2011 0640   ALKPHOS 50 08/31/2011 0640   BILITOT 0.2* 08/31/2011 0640   GFRNONAA >90 08/31/2011 0640   GFRAA >90 08/31/2011 0640    GFR Estimated Creatinine Clearance: 73.6 ml/min (by C-G formula based on Cr of 0.61). No results found for this basename: LIPASE:2,AMYLASE:2 in the last 72 hours No results found for this basename: CKTOTAL:3,CKMB:3,CKMBINDEX:3,TROPONINI:3 in the last 72 hours No components found with this basename: POCBNP:3 No results found for this basename: DDIMER:2 in the last 72 hours No results found for this basename: HGBA1C:2 in the last 72 hours No results found for this basename: CHOL:2,HDL:2,LDLCALC:2,TRIG:2,CHOLHDL:2,LDLDIRECT:2 in the last 72 hours No results found for this basename: TSH,T4TOTAL,FREET3,T3FREE,THYROIDAB in the last 72 hours No results found for  this basename: VITAMINB12:2,FOLATE:2,FERRITIN:2,TIBC:2,IRON:2,RETICCTPCT:2 in the last 72 hours Coags: No results found for this basename: PT:2,INR:2 in the last 72 hours Microbiology: Recent Results (from the past 240 hour(s))  CULTURE, BLOOD (ROUTINE X 2)     Status: Normal (Preliminary result)   Collection Time   08/29/11  1:00 AM      Component Value Range Status Comment   Specimen Description BLOOD LEFT ARM   Final    Special Requests BOTTLES DRAWN AEROBIC AND ANAEROBIC 10CC EACH   Final    Culture  Setup Time 08/29/2011 16:50   Final    Culture     Final    Value:        BLOOD CULTURE RECEIVED NO GROWTH TO DATE CULTURE WILL BE HELD FOR 5 DAYS BEFORE ISSUING A FINAL NEGATIVE REPORT   Report Status PENDING   Incomplete   CULTURE, BLOOD (ROUTINE X 2)     Status: Normal (Preliminary result)   Collection Time   08/29/11  1:10 AM      Component Value Range Status Comment   Specimen Description BLOOD RIGHT HAND   Final    Special Requests BOTTLES DRAWN AEROBIC AND ANAEROBIC 5CC EACH   Final    Culture  Setup Time 08/29/2011 16:50   Final    Culture     Final    Value:        BLOOD CULTURE RECEIVED NO GROWTH TO DATE CULTURE WILL BE HELD FOR 5 DAYS BEFORE ISSUING A FINAL NEGATIVE REPORT   Report Status PENDING   Incomplete   URINE CULTURE     Status: Normal   Collection Time   08/29/11  2:53 AM      Component Value Range Status Comment   Specimen Description URINE, CLEAN CATCH   Final    Special Requests NONE   Final    Culture  Setup Time 08/29/2011 19:29   Final    Colony Count NO GROWTH   Final    Culture NO GROWTH   Final    Report Status 08/30/2011 FINAL   Final   CSF CULTURE     Status: Normal   Collection Time   08/29/11 11:41 AM      Component Value Range Status Comment   Specimen Description CSF   Final    Special Requests TUBE 2 2.1CC   Final    Gram Stain     Final    Value: CYTOSPIN WBC PRESENT,BOTH PMN AND MONONUCLEAR     NO ORGANISMS  SEEN     Performed at [facility]   Culture NO GROWTH 3 DAYS   Final    Report Status 09/02/2011 FINAL   Final   GRAM STAIN     Status: Normal   Collection Time   08/29/11 11:41 AM      Component Value Range Status Comment   Specimen Description CSF   Final    Special Requests TUBE 2 2.1CC   Final    Gram Stain     Final    Value: CYTOSPIN PREP     WBC PRESENT,BOTH PMN AND MONONUCLEAR     NO ORGANISMS SEEN   Report Status 08/29/2011 FINAL   Final      BRIEF HOSPITAL COURSE:   Principal Problem:  *Fever with Headache -given her clincal features-a viral meningitis was suspected. A LP was done on 08/29/11-which showed normal Protein and Glucose values. She did have mild pleocytosis-however this was likely 2/2 to bloody tap -On admission she was empirically started on IV Vanco, Rocephin and Doxycycline, ID was consulted as well. -She had a negative CT head, a negative Brain MRI and a negative CT Chest and abdomen. -During her hospital course, she slowly made clinical improvement, she now has been afebrile for almost 3 days, her headache is now minimal and easily controlled with just Tylenol. She has been seen by ID today, and cleared to be discharged today without any antibiotics.  -Cause of fever and headache is still unknown, likely viral syndrome -Of note  -CSF and Blood cultures are negative to date  -HIV neg   -ANA neg   -Mono screen-neg   -HSV-neg  Transaminitis  -almost resolved-likely viral -Hepatitis serology negative  Headache  -?2/2 Meningitis-?viral-significantly better at time of discharge -MRI brain-negatve   Mild Facial swelling with periorbital puffiness -resolving at time of discharge-unknown etiology -UA negative for proteinuria -CT Chest negative for Lymphadenopathy and SVC syndrome -Echo-normal EF with grade 2 diastolic dysfunction  Elevated RMSF titre -discussed with Dr Orvan Falconer, no further treatment needed, as patient completed 3 days of doxycycline after she  defervesced.  TODAY-DAY OF DISCHARGE:  Subjective:   Shalie Anderegg today has no headache,no chest abdominal pain,no new weakness tingling or numbness, feels much better wants to go home today.   Objective:   Blood pressure 138/88, pulse 81, temperature 98.5 F (36.9 C), temperature source Oral, resp. rate 20, height 5\' 7"  (1.702 m), weight 69.9 kg (154 lb 1.6 oz), SpO2 99.00%.  Intake/Output Summary (Last 24 hours) at 09/02/11 1619 Last data filed at 09/01/11 2111  Gross per 24 hour  Intake    120 ml  Output      0 ml  Net    120 ml    Exam Awake Alert, Oriented *3, No new F.N deficits, Normal affect [place].AT,PERRAL Supple Neck,No JVD, No cervical lymphadenopathy appriciated.  Symmetrical Chest wall movement, Good air movement bilaterally, CTAB RRR,No Gallops,Rubs or new Murmurs, No Parasternal Heave +ve B.Sounds, Abd Soft, Non tender, No organomegaly appriciated, No rebound -guarding or rigidity. No Cyanosis, Clubbing or edema, No new Rash or bruise  DISPOSITION: Home  DISCHARGE INSTRUCTIONS:    Follow-up Information    Follow up with Selinda Flavin, MD. Schedule an appointment as soon as possible for a visit in 1 week.   Contact information:   [address] [place] [address] [phone]       Follow up with Cliffton Asters, MD. (As needed)    Contact information:  [address] [place] [address] [phone]           Total Time spent on discharge equals 45 minutes.  SignedJeoffrey Massed 09/02/2011 4:19 PM

## 2011-09-02 NOTE — Progress Notes (Signed)
Patient ID: [name] [name], female   DOB: 12-04-1950, 61 y.o.   MRN: [id number]    [facility]    Date of Admission:  08/28/2011           Day 6 doxycycline Principal Problem:  *Fever Active Problems:  Headache  Knee pain, right  Back pain  Bacteremia due to Gram-positive bacteria  Hepatitis  Periorbital edema  Facial rash  Blurred vision, right eye  Tremor  Meningitis      . doxycycline  100 mg Oral Q12H  . enoxaparin (LOVENOX) injection  40 mg Subcutaneous Q24H  . feeding supplement  237 mL Oral TID WC  . DISCONTD: cefTRIAXone (ROCEPHIN)  IV  2 g Intravenous Q12H  . DISCONTD: doxycycline (VIBRAMYCIN) IV  100 mg Intravenous Q12H  . DISCONTD: vancomycin  1,250 mg Intravenous Q8H    Subjective: She still has some headache and mild blurred vision in the right eye but states that she is feeling better. She is eager to go home.  Objective: Temp:  [98.4 F (36.9 C)-98.7 F (37.1 C)] 98.6 F (37 C) (07/08 0453) Pulse Rate:  [71-80] 80  (07/08 0453) Resp:  [17-18] 17  (07/08 0453) BP: (123-132)/(77-80) 132/80 mmHg (07/08 0453) SpO2:  [97 %-100 %] 98 % (07/08 0453)  General: No further periorbital edema or facial rash Skin: No rash Lungs: Clear Cor: Regular S1 and S2 no murmur Abdomen: Nontender No focal neurologic findings  Lab Results Lab Results  Component Value Date   WBC 4.8 08/31/2011   HGB 10.4* 08/31/2011   HCT 31.3* 08/31/2011   MCV 86.5 08/31/2011   PLT 186 08/31/2011    Lab Results  Component Value Date   CREATININE 0.61 08/31/2011   BUN 6 08/31/2011   NA 139 08/31/2011   K 3.7 08/31/2011   CL 106 08/31/2011   CO2 25 08/31/2011    Lab Results  Component Value Date   ALT 43* 08/31/2011   AST 31 08/31/2011   ALKPHOS 50 08/31/2011   BILITOT 0.2* 08/31/2011      Microbiology: Recent Results (from the past 240 hour(s))  CULTURE, BLOOD (ROUTINE X 2)     Status: Normal (Preliminary result)   Collection Time   08/29/11  1:00 AM      Component Value  Range Status Comment   Specimen Description BLOOD LEFT ARM   Final    Special Requests BOTTLES DRAWN AEROBIC AND ANAEROBIC 10CC EACH   Final    Culture  Setup Time 08/29/2011 16:50   Final    Culture     Final    Value:        BLOOD CULTURE RECEIVED NO GROWTH TO DATE CULTURE WILL BE HELD FOR 5 DAYS BEFORE ISSUING A FINAL NEGATIVE REPORT   Report Status PENDING   Incomplete   CULTURE, BLOOD (ROUTINE X 2)     Status: Normal (Preliminary result)   Collection Time   08/29/11  1:10 AM      Component Value Range Status Comment   Specimen Description BLOOD RIGHT HAND   Final    Special Requests BOTTLES DRAWN AEROBIC AND ANAEROBIC 5CC EACH   Final    Culture  Setup Time 08/29/2011 16:50   Final    Culture     Final    Value:        BLOOD CULTURE RECEIVED NO GROWTH TO DATE CULTURE WILL BE HELD FOR 5 DAYS BEFORE ISSUING A FINAL NEGATIVE REPORT  Report Status PENDING   Incomplete   URINE CULTURE     Status: Normal   Collection Time   08/29/11  2:53 AM      Component Value Range Status Comment   Specimen Description URINE, CLEAN CATCH   Final    Special Requests NONE   Final    Culture  Setup Time 08/29/2011 19:29   Final    Colony Count NO GROWTH   Final    Culture NO GROWTH   Final    Report Status 08/30/2011 FINAL   Final   CSF CULTURE     Status: Normal (Preliminary result)   Collection Time   08/29/11 11:41 AM      Component Value Range Status Comment   Specimen Description CSF   Final    Special Requests TUBE 2 2.1CC   Final    Gram Stain     Final    Value: CYTOSPIN WBC PRESENT,BOTH PMN AND MONONUCLEAR     NO ORGANISMS SEEN     Performed at [facility]   Culture NO GROWTH 3 DAYS   Final    Report Status PENDING   Incomplete   GRAM STAIN     Status: Normal   Collection Time   08/29/11 11:41 AM      Component Value Range Status Comment   Specimen Description CSF   Final    Special Requests TUBE 2 2.1CC   Final    Gram Stain     Final    Value: CYTOSPIN PREP     WBC  PRESENT,BOTH PMN AND MONONUCLEAR     NO ORGANISMS SEEN   Report Status 08/29/2011 FINAL   Final    Assessment: Her multisystem febrile illness has resolved. No definite cause for her illness was found and probably never will be. I think it is reasonable to send her home today.  Plan: 1. Recommend discharge home today off of antibiotics 2. Followup with ID as needed  Cliffton Asters, MD [facility] [phone] pager   [phone] cell 09/02/2011, 10:24 AM      1

## 2011-09-02 NOTE — Care Management Note (Signed)
    Page 1 of 1   09/02/2011     5:32:47 PM   CARE MANAGEMENT NOTE 09/02/2011  Patient:  [name] [name], [name] [name]   Account Number:  [id number]  Date Initiated:  09/02/2011  Documentation initiated by:  Letha Cape  Subjective/Objective Assessment:   dx fever, headache  admit- lives with spouse, pta independent.     Action/Plan:   Anticipated DC Date:  09/04/2011   Anticipated DC Plan:  HOME/SELF CARE      DC Planning Services  CM consult      Choice offered to / List presented to:             Status of service:  Completed, signed off Medicare Important Message given?   (If response is "NO", the following Medicare IM given date fields will be blank) Date Medicare IM given:   Date Additional Medicare IM given:    Discharge Disposition:  HOME/SELF CARE  Per UR Regulation:  Reviewed for med. necessity/level of care/duration of stay  If discussed at Long Length of Stay Meetings, dates discussed:    Comments:  09/02/11 15:06 Letha Cape RN, BSN [phone] patient lives with spouse, pta independent.  No needs anticipated.  Patient for dc today.

## 2011-09-04 LAB — CULTURE, BLOOD (ROUTINE X 2): Culture: NO GROWTH

## 2014-08-21 ENCOUNTER — Emergency Department (HOSPITAL_COMMUNITY)
Admission: EM | Admit: 2014-08-21 | Discharge: 2014-08-21 | Disposition: A | Discharge status: Home / Self Care | Payer: BC Managed Care – PPO | Attending: Emergency Medicine | Admitting: Emergency Medicine

## 2014-08-21 ENCOUNTER — Encounter (HOSPITAL_COMMUNITY): Payer: Self-pay | Admitting: *Deleted

## 2014-08-21 DIAGNOSIS — Y9389 Activity, other specified: Secondary | ICD-10-CM | POA: Insufficient documentation

## 2014-08-21 DIAGNOSIS — Y998 Other external cause status: Secondary | ICD-10-CM | POA: Insufficient documentation

## 2014-08-21 DIAGNOSIS — Z79899 Other long term (current) drug therapy: Secondary | ICD-10-CM | POA: Insufficient documentation

## 2014-08-21 DIAGNOSIS — Y9289 Other specified places as the place of occurrence of the external cause: Secondary | ICD-10-CM | POA: Diagnosis not present

## 2014-08-21 DIAGNOSIS — S70361A Insect bite (nonvenomous), right thigh, initial encounter: Secondary | ICD-10-CM | POA: Insufficient documentation

## 2014-08-21 DIAGNOSIS — W57XXXA Bitten or stung by nonvenomous insect and other nonvenomous arthropods, initial encounter: Secondary | ICD-10-CM | POA: Insufficient documentation

## 2014-08-21 MED ORDER — DOXYCYCLINE HYCLATE 100 MG PO TABS
200.0000 mg | ORAL_TABLET | Freq: Once | ORAL | Status: AC
  Administered 2014-08-21: 200 mg via ORAL
  Filled 2014-08-21: qty 2

## 2014-08-21 NOTE — ED Provider Notes (Signed)
CSN: [id number]     Arrival date & time 08/21/14  1034 History  This chart was scribed for non-physician practitioner, Rhea Bleacher, PA-C, working with Eber Hong, MD, by Ronney Lion, ED Scribe. This patient was seen in room TR11C/TR11C and the patient's care was started at 10:57 AM.   CC: Tick Bite   The history is provided by the patient. No language interpreter was used.    HPI Comments: [name] [name] is a 63 y.o. female who presents to the Emergency Department complaining of a tick bite that she first noticed this morning. She denies any symptoms besides mild redness around the tick bite area. Patient is concerned because she states she was in the hospital in 2013 due to being "really sick" and doctors at [facility] thought her symptoms might be due to RMSF. Patient removed the tick, which didn't seem to be engorged, and brought it with her in a plastic bag. Patient has known allergies to Sulfa and Penicillins, as well as other drugs listed in the allergies section.   Past Medical History  Diagnosis Date  . No pertinent past medical history    Past Surgical History  Procedure Laterality Date  . Tubal ligation     Family History  Problem Relation Age of Onset  . Diabetes type II Sister    History  Substance Use Topics  . Smoking status: Never Smoker   . Smokeless tobacco: Not on file  . Alcohol Use: No   OB History    No data available     Review of Systems  Constitutional: Negative for fever.  Gastrointestinal: Negative for nausea and vomiting.  Skin: Positive for color change (mild redness).  Hematological: Negative for adenopathy.   Allergies  Iodine; Iohexol; Penicillins; Shellfish allergy; and Sulfa drugs cross reactors  Home Medications   Prior to Admission medications   Medication Sig Start Date End Date Taking? Authorizing Provider  Ascorbic Acid (VITAMIN C PO) Take 1 tablet by mouth daily.    Historical Provider, MD  b complex vitamins capsule Take 1  capsule by mouth daily.    Historical Provider, MD  Cyanocobalamin (VITAMIN B-12 PO) Take 1 tablet by mouth daily.    Historical Provider, MD  folic acid (FOLVITE) 1 MG tablet Take 1 mg by mouth daily.    Historical Provider, MD  ibuprofen (ADVIL,MOTRIN) 200 MG tablet Take 200 mg by mouth every 6 (six) hours as needed. For fever/pain    Historical Provider, MD  Pyridoxine HCl (VITAMIN B-6 PO) Take 1 tablet by mouth daily.    Historical Provider, MD   There were no vitals taken for this visit. Physical Exam  Constitutional: She appears well-developed and well-nourished.  HENT:  Head: Normocephalic and atraumatic.  Eyes: Conjunctivae are normal.  Neck: Normal range of motion. Neck supple.  Pulmonary/Chest: No respiratory distress.  Neurological: She is alert.  Skin: Skin is warm and dry.  Small punctate wound noted on right lateral thigh at site of tick bite. No retained parts visualized.  Psychiatric: She has a normal mood and affect.  Nursing note and vitals reviewed.   ED Course  Procedures (including critical care time)  COORDINATION OF CARE: 10:59 AM - Discussed the option of prophylactic treatment for Lyme disease (doxycycline). Advised pt to monitor new symptoms, including a rash in the area or fever, and to return for treatment with a longer course of doxycycline. if these develop. Pt verbalized understanding and states she would like to have prophylactic  treatment, citing her previous experience in the hospital as reasons to feel cautious.  Vital signs reviewed and are as follows: Filed Vitals:   08/21/14 1117  BP: 146/87  Pulse: 79  Temp: 98.1 F (36.7 C)  Resp: 16     MDM   Final diagnoses:  Tick bite   Patient with tick bite. Patient wishes to have prophylaxis for Lyme disease. No febrile illness or significant rash at this time. Patient counseled to watch for these and return if she develops any symptoms.  I personally performed the services described in this  documentation, which was scribed in my presence. The recorded information has been reviewed and is accurate.     Renne Crigler, PA-C 08/21/14 1124  Eber Hong, MD 09/01/14 1345

## 2014-08-21 NOTE — ED Notes (Signed)
Pt reports tick first seen this AM.

## 2014-08-21 NOTE — ED Notes (Signed)
Declined W/C at D/C and was escorted to lobby by RN. 

## 2014-08-21 NOTE — Discharge Instructions (Signed)
Please read and follow all provided instructions.  Your diagnoses today include:  1. Tick bite     Tests performed today include:  Vital signs. See below for your results today.   Medications prescribed:   None  Home care instructions:  Follow any educational materials contained in this packet.  Follow-up instructions: Please follow-up with your primary care provider as needed for further evaluation of your symptoms.  Return instructions:   Please return to the Emergency Department if you experience worsening symptoms.   Please return if you have any other emergent concerns.  ---------------

## 2014-10-10 ENCOUNTER — Ambulatory Visit: Payer: BC Managed Care – PPO | Admitting: Neurology

## 2014-10-12 ENCOUNTER — Ambulatory Visit (INDEPENDENT_AMBULATORY_CARE_PROVIDER_SITE_OTHER): Payer: BC Managed Care – PPO | Admitting: Neurology

## 2014-10-12 ENCOUNTER — Encounter: Payer: Self-pay | Admitting: Neurology

## 2014-10-12 VITALS — BP 153/90 | HR 70 | Ht 66.0 in | Wt 167.4 lb

## 2014-10-12 DIAGNOSIS — R251 Tremor, unspecified: Secondary | ICD-10-CM

## 2014-10-12 NOTE — Progress Notes (Signed)
Reason for visit: Tremor  Referring physician: Dr. [name] is a 64 y.o. female  History of present illness:  [name] [name] is a 64 year old right-handed black female with a history of tremor that she says began in March 2016. The patient indicates that the tremor affects the left hand and left leg, and may be present at rest or with activity. She indicates that her balance is been effective, she will have a tendency to veer to the left. She denies a true numbness or weakness of the extremities, however. The patient denies any headaches or vision changes. She has undergone MRI evaluation of the brain in July 2016, this shows minimal white matter changes, no change from a prior study done in July 2013. The patient indicates that she has a older sister with tremors well. No other family members have been noted to have tremor. She returns to this office for an evaluation. She denies any change in speech or swallowing.  Past Medical History  Diagnosis Date  . Bursitis   . Cellulitis     arm  . Headache   . Insomnia   . Hyperlipidemia   . Onychomycosis   . Osteoarthritis   . OA (osteoarthritis)   . Tenosynovitis     Past Surgical History  Procedure Laterality Date  . Tubal ligation      Family History  Problem Relation Age of Onset  . Diabetes type II Sister   . Asthma Sister   . Heart failure Mother   . Stroke Mother   . Prostate cancer Father   . Diabetes type II Brother   . Tremor Sister   . Diabetes type II Brother   . Cancer Brother     prostate  . Cancer Brother     prostate  . Cancer Brother     prostate    Social history:  reports that she has never smoked. She has never used smokeless tobacco. She reports that she does not drink alcohol or use illicit drugs.  Medications:  Prior to Admission medications   Medication Sig Start Date End Date Taking? Authorizing Provider  Ascorbic Acid (VITAMIN C PO) Take 1 tablet by mouth daily.   Yes Historical  Provider, MD  Calcium Carbonate-Vitamin D (CALCIUM-VITAMIN D) 500-200 MG-UNIT per tablet Take 1 tablet by mouth daily.   Yes Historical Provider, MD  ibuprofen (ADVIL,MOTRIN) 800 MG tablet Take 800 mg by mouth every 8 (eight) hours as needed.   Yes Historical Provider, MD  Magnesium 250 MG TABS Take 1 tablet by mouth daily.   Yes Historical Provider, MD  Omega-3 Fatty Acids (FISH OIL) 1200 MG CAPS Take 1 capsule by mouth daily.   Yes Historical Provider, MD      Allergies  Allergen Reactions  . Iodine Swelling  . Iohexol      Desc: FACIAL SWELLING   . Penicillins Itching and Swelling  . Shellfish Allergy Swelling  . Sulfa Drugs Cross Reactors Nausea And Vomiting    ROS:  Out of a complete 14 system review of symptoms, the patient complains only of the following symptoms, and all other reviewed systems are negative.  Fatigue Shortness of breath Joint pain Weakness, tremor Decreased energy Insomnia  Blood pressure 153/90, pulse 70, height  (1.676 m), weight 167 lb 6.4 oz (75.932 kg).  Physical Exam  General: The patient is alert and cooperative at the time of the examination.  Eyes: Pupils are equal, round, and reactive to  light. Discs are flat bilaterally.  Neck: The neck is supple, no carotid bruits are noted.  Respiratory: The respiratory examination is clear.  Cardiovascular: The cardiovascular examination reveals a regular rate and rhythm, no obvious murmurs or rubs are noted.  Skin: Extremities are without significant edema.  Neurologic Exam  Mental status: The patient is alert and oriented x 3 at the time of the examination. The patient has apparent normal recent and remote memory, with an apparently normal attention span and concentration ability.  Cranial nerves: Facial symmetry is present. There is good sensation of the face to pinprick and soft touch bilaterally. The strength of the facial muscles and the muscles to head turning and shoulder shrug are  normal bilaterally. Speech is well enunciated, no aphasia or dysarthria is noted. Extraocular movements are full. Visual fields are full. The tongue is midline, and the patient has symmetric elevation of the soft palate. No obvious hearing deficits are noted.  Motor: The motor testing reveals 5 over 5 strength of all 4 extremities. Good symmetric motor tone is noted throughout.  Sensory: Sensory testing is intact to pinprick, soft touch, vibration sensation, and position sense on all 4 extremities. No evidence of extinction is noted.  Coordination: Cerebellar testing reveals good finger-nose-finger and heel-to-shin bilaterally. Occasional resting tremors are noted with the left hand.  Gait and station: Gait is normal. Tandem gait is normal. Romberg is negative. No drift is seen.  Reflexes: Deep tendon reflexes are symmetric and normal bilaterally. Toes are downgoing bilaterally.   Assessment/Plan:  1. Tremor  The patient appears to have intermittent tremor affecting the left hand at rest. I see no evidence of an intention tremor. This tremor will need to be followed over time for developing features of parkinsonism. I do not see enough on the clinical examination at this time to diagnose Parkinson's disease. The patient will follow-up in 4 months, sooner if needed. MRI of the brain shows minimal white matter changes, no etiology of the tremor is noted.  Marlan Palau MD 10/12/2014 8:50 PM  [facility] [address] [place], [place] [address]  Phone [phone] Fax [phone]

## 2014-10-12 NOTE — Patient Instructions (Signed)

## 2015-02-15 ENCOUNTER — Ambulatory Visit: Payer: BC Managed Care – PPO | Admitting: Neurology

## 2015-03-23 ENCOUNTER — Ambulatory Visit (INDEPENDENT_AMBULATORY_CARE_PROVIDER_SITE_OTHER): Payer: BC Managed Care – PPO | Admitting: Neurology

## 2015-03-23 ENCOUNTER — Encounter: Payer: Self-pay | Admitting: Neurology

## 2015-03-23 VITALS — BP 137/85 | HR 79 | Ht 67.0 in | Wt 169.5 lb

## 2015-03-23 DIAGNOSIS — G2581 Restless legs syndrome: Secondary | ICD-10-CM

## 2015-03-23 DIAGNOSIS — G20A1 Parkinson's disease without dyskinesia, without mention of fluctuations: Secondary | ICD-10-CM

## 2015-03-23 DIAGNOSIS — R251 Tremor, unspecified: Secondary | ICD-10-CM

## 2015-03-23 DIAGNOSIS — G20 Parkinson's disease: Secondary | ICD-10-CM | POA: Diagnosis not present

## 2015-03-23 HISTORY — DX: Restless legs syndrome: G25.81

## 2015-03-23 HISTORY — DX: Parkinson's disease without dyskinesia, without mention of fluctuations: G20.A1

## 2015-03-23 HISTORY — DX: Parkinson's disease: G20

## 2015-03-23 NOTE — Patient Instructions (Signed)
Parkinson Disease Parkinson disease is a disorder of the central nervous system, which includes the brain and spinal cord. A person with this disease slowly loses the ability to completely control body movements. Within the brain, there is a group of nerve cells (basal ganglia) that help control movement. The basal ganglia are damaged and do not work properly in a person with Parkinson disease. In addition, the basal ganglia produce and use a brain chemical called dopamine. The dopamine chemical sends messages to other parts of the body to control and coordinate body movements. Dopamine levels are low in a person with Parkinson disease. If the dopamine levels are low, then the body does not receive the correct messages it needs to move normally.  CAUSES  The exact reason why the basal ganglia get damaged is not known. Some medical researchers have thought that infection, genes, environment, and certain medicines may contribute to the cause.  SYMPTOMS   An early symptom of Parkinson disease is often an uncontrolled shaking (tremor) of the hands. The tremor will often disappear when the affected hand is consciously used.  As the disease progresses, walking, talking, getting out of a chair, and new movements become more difficult.  Muscles get stiff and movements become slower.  Balance and coordination become harder.  Depression, trouble swallowing, urinary problems, constipation, and sleep problems can occur.  Later in the disease, memory and thought processes may deteriorate. DIAGNOSIS  There are no specific tests to diagnose Parkinson disease. You may be referred to a neurologist for evaluation. Your caregiver will ask about your medical history, symptoms, and perform a physical exam. Blood tests and imaging tests of your brain may be performed to rule out other diseases. The imaging tests may include an MRI or a CT scan. TREATMENT  The goal of treatment is to relieve symptoms. Medicines may be  prescribed once the symptoms become troublesome. Medicine will not stop the progression of the disease, but medicine can make movement and balance better and help control tremors. Speech and occupational therapy may also be prescribed. Sometimes, surgical treatment of the brain can be done in young people. HOME CARE INSTRUCTIONS  Get regular exercise and rest periods during the day to help prevent exhaustion and depression.  If getting dressed becomes difficult, replace buttons and zippers with Velcro and elastic on your clothing.  Take all medicine as directed by your caregiver.  Install grab bars or railings in your home to prevent falls.  Go to speech or occupational therapy as directed.  Keep all follow-up visits as directed by your caregiver. SEEK MEDICAL CARE IF:  Your symptoms are not controlled with your medicine.  You fall.  You have trouble swallowing or choke on your food. MAKE SURE YOU:  Understand these instructions.  Will watch your condition.  Will get help right away if you are not doing well or get worse.   This information is not intended to replace advice given to you by your health care provider. Make sure you discuss any questions you have with your health care provider.   Document Released: 02/09/2000 Document Revised: 06/08/2012 Document Reviewed: 03/13/2011 Elsevier Interactive Patient Education 2016 [employer]  

## 2015-03-23 NOTE — Progress Notes (Signed)
Reason for visit: Parkinson's disease  [name] [name] is an 65 y.o. female  History of present illness:  [name] [name] is a 65 year old right-handed black female with a history of a progressive tremor affecting mainly the left side. The patient reports that she has had some alteration in her balance is well. She has not had any falls. When going downstairs occasionally she will feel as if her body is getting ahead of her legs. The patient has also developed a tremor that has involved the left lower extremity as well as the left arm. She has no problems on the right side. She denies any significant issues with memory at this time. She denies any issues controlling the bowels or the bladder. The patient does report some constipation, however. She also has some issues with restless leg syndrome at night, but this is not a consistent issue every evening. The patient may note tremor in the left arm with holding something in the hand. She usually has a resting type tremor involving arm. The patient returns to this office for an evaluation.  Past Medical History  Diagnosis Date  . Bursitis   . Cellulitis     arm  . Headache   . Insomnia   . Hyperlipidemia   . Onychomycosis   . Osteoarthritis   . OA (osteoarthritis)   . Tenosynovitis   . RLS (restless legs syndrome) 03/23/2015  . Parkinson disease (HCC) 03/23/2015    Past Surgical History  Procedure Laterality Date  . Tubal ligation      Family History  Problem Relation Age of Onset  . Diabetes type II Sister   . Asthma Sister   . Heart failure Mother   . Stroke Mother   . Prostate cancer Father   . Diabetes type II Brother   . Tremor Sister   . Diabetes type II Brother   . Cancer Brother     prostate  . Cancer Brother     prostate  . Cancer Brother     prostate    Social history:  reports that she has never smoked. She has never used smokeless tobacco. She reports that she does not drink alcohol or use illicit drugs.      Allergies  Allergen Reactions  . Caffeine     Palpitations and pain   . Iodine Swelling  . Iohexol      Desc: FACIAL SWELLING   . Penicillins Itching and Swelling  . Shellfish Allergy Swelling  . Sulfa Drugs Cross Reactors Nausea And Vomiting    Medications:  Prior to Admission medications   Medication Sig Start Date End Date Taking? Authorizing Provider  acetaminophen (TYLENOL) 500 MG tablet Take 1,000 mg by mouth every 6 (six) hours as needed.   Yes Historical Provider, MD  Calcium Carbonate-Vitamin D (CALCIUM-VITAMIN D) 500-200 MG-UNIT per tablet Take 1 tablet by mouth daily.   Yes Historical Provider, MD  cetirizine (ZYRTEC) 10 MG tablet Take 10 mg by mouth daily.   Yes Historical Provider, MD  doxycycline (VIBRA-TABS) 100 MG tablet Take 100 mg by mouth 2 (two) times daily.   Yes Historical Provider, MD  guaifenesin (ROBITUSSIN) 100 MG/5ML syrup Take 200 mg by mouth every 6 (six) hours as needed for cough.   Yes Historical Provider, MD  Magnesium 250 MG TABS Take 1 tablet by mouth daily.   Yes Historical Provider, MD  Omega-3 Fatty Acids (FISH OIL) 1200 MG CAPS Take 1 capsule by mouth 2 (two) times daily.  Yes Historical Provider, MD  Potassium 99 MG TABS Take 1 tablet by mouth daily.   Yes Historical Provider, MD    ROS:  Out of a complete 14 system review of symptoms, the patient complains only of the following symptoms, and all other reviewed systems are negative.  Fatigue Runny nose Cough Constipation Walking difficulty, neck pain Tremors  Blood pressure 137/85, pulse 79, height  (1.702 m), weight 169 lb 8 oz (76.885 kg).  Physical Exam  General: The patient is alert and cooperative at the time of the examination.  Skin: No significant peripheral edema is noted.   Neurologic Exam  Mental status: The patient is alert and oriented x 3 at the time of the examination. The patient has apparent normal recent and remote memory, with an apparently normal  attention span and concentration ability.   Cranial nerves: Facial symmetry is present. Speech is normal, no aphasia or dysarthria is noted. Extraocular movements are full. Visual fields are full. Very minimal masking of the face is seen.  Motor: The patient has good strength in all 4 extremities.  Sensory examination: Soft touch sensation is symmetric on the face, arms, and legs.  Coordination: The patient has good finger-nose-finger and heel-to-shin bilaterally. The patient has a true resting tremor in the left hand that may involve flexion at the MP joints of the fingers, and a pill-rolling tremor. The tremor is also present while walking. The patient may have occasional tremors involving the left lower extremity.  Gait and station: The patient has a normal gait, but with walking there is decreased arm swing on the left, tremors seen in the left hand. Tandem gait is normal. Romberg is negative. No drift is seen.  Reflexes: Deep tendon reflexes are symmetric.   Assessment/Plan:  1. Parkinson's disease with left-sided features  The patient is developing symptoms of Parkinson's disease. She now has tremors of the left arm and left leg, tremor in the left arm while walking, decreased arm swing, mild masking of the face, constipation and restless leg syndrome. The patient will be followed currently, we may initiate medication therapy in the future. She will follow-up in 5 months.  Greater than 50% of this office visit was spent in counseling, discussing the diagnosis of Parkinson's disease and the various features of Parkinson's disease. The patient was encouraged to remain active as part of the treatment.  Marlan Palau MD 03/23/2015 7:44 PM  [facility] [address] [place], [place] [address]  Phone [phone] Fax [phone]

## 2015-05-03 ENCOUNTER — Ambulatory Visit (INDEPENDENT_AMBULATORY_CARE_PROVIDER_SITE_OTHER)
Admission: RE | Admit: 2015-05-03 | Discharge: 2015-05-03 | Disposition: A | Discharge status: Home / Self Care | Payer: BC Managed Care – PPO | Source: Ambulatory Visit | Attending: Internal Medicine | Admitting: Internal Medicine

## 2015-05-03 ENCOUNTER — Encounter: Payer: Self-pay | Admitting: Internal Medicine

## 2015-05-03 ENCOUNTER — Ambulatory Visit (INDEPENDENT_AMBULATORY_CARE_PROVIDER_SITE_OTHER): Payer: BC Managed Care – PPO | Admitting: Internal Medicine

## 2015-05-03 VITALS — BP 118/66 | HR 90 | Ht 66.0 in | Wt 162.0 lb

## 2015-05-03 DIAGNOSIS — R05 Cough: Secondary | ICD-10-CM | POA: Diagnosis not present

## 2015-05-03 DIAGNOSIS — R058 Other specified cough: Secondary | ICD-10-CM

## 2015-05-03 MED ORDER — PREDNISONE 10 MG PO TABS: ORAL_TABLET | ORAL | Status: DC

## 2015-05-03 MED ORDER — PANTOPRAZOLE SODIUM 40 MG PO TBEC: 40.0000 mg | DELAYED_RELEASE_TABLET | Freq: Every day | ORAL | Status: DC

## 2015-05-03 MED ORDER — FAMOTIDINE 20 MG PO TABS: ORAL_TABLET | ORAL | Status: DC

## 2015-05-03 MED ORDER — TRAMADOL HCL 50 MG PO TABS: ORAL_TABLET | ORAL | Status: DC

## 2015-05-03 NOTE — Patient Instructions (Addendum)
Pantoprazole (protonix) 40 mg   Take  30-60 min before first meal of the day and Pepcid (famotidine)  20 mg one @  bedtime until cough gone for two full weeks  GERD (REFLUX)  is an extremely common cause of respiratory symptoms just like yours , many times with no obvious heartburn at all.    It can be treated with medication, but also with lifestyle changes including elevation of the head of your bed (ideally with 6 inch  bed blocks),  Smoking cessation, avoidance of late meals, excessive alcohol, and avoid fatty foods, chocolate, peppermint, colas, red wine, and acidic juices such as orange juice.  NO MINT OR MENTHOL PRODUCTS SO NO COUGH DROPS  USE SUGARLESS CANDY INSTEAD (Jolley ranchers or Stover's or Life Savers) or even ice chips will also do - the key is to swallow to prevent all throat clearing. NO OIL BASED VITAMINS - use powdered substitutes - STOP FISH OIL WHENEVER ACTIVE COUGHING  Prednisone 10 mg take  4 each am x 2 days,   2 each am x 2 days,  1 each am x 2 days and stop   Take delsym two tsp every 12 hours and supplement if needed with  tramadol 50 mg up to 2 every 4 hours to suppress the urge to cough. Swallowing water or using ice chips/non mint and menthol containing candies (such as lifesavers or sugarless jolly ranchers) are also effective.  You should rest your voice and avoid activities that you know make you cough.  Once you have eliminated the cough for 3 straight days try reducing the tramadol first,  then the delsym as tolerated.     Please remember to go to the  x-ray department downstairs for your tests - we will call you with the results when they are available.

## 2015-05-03 NOTE — Progress Notes (Signed)
Subjective:     Patient ID: [name] [name], female   DOB: Sep 02, 1950,    MRN: [id number]  HPI  [age] yobf never smoker previously seen 2012 for cough that did not resolve p CAP responsive to GERD Rx referred back to pulmonary clinic 05/03/2015 by DR Selinda FlavinKevin Howard for recurrent cough since Jan 2017.   05/03/2015 1st [facility] office visit/ Nasiya Pascual   Chief Complaint  Patient presents with  . Pulmonary Consult    Pt seen here last in 2009. She was referred back by Dr Selinda FlavinKevin Howard. She states that after last visit here her cough had resolved and then started back Jan 2017. She is coughing until choking/gagging with no specific trigger. She has also had some hemoptysis at night.   was on ppi then 2014 changed diet and fine for sev years off of all gerd rx except for diet then Jan 17th 2017 aburpt onset bad dru cough/ aching all over rx with doxy then levaquin and cough meds but no better at all.  Cough 24/7 no obvious trigger but worse with voice use  No obvious day to day or daytime variability or assoc excess/ purulent sputum or mucus plugs or sob  or cp or chest tightness, subjective wheeze or overt sinus or hb symptoms. No unusual exp hx or h/o childhood pna/ asthma or knowledge of premature birth.  Also denies any obvious fluctuation of symptoms with weather or environmental changes or other aggravating or alleviating factors except as outlined above   Current Medications, Allergies, Complete Past Medical History, Past Surgical History, Family History, and Social History were reviewed in [facility] electronic medical record.  ROS  The following are not active complaints unless bolded sore throat, dysphagia, dental problems, itching, sneezing,  nasal congestion or excess/ purulent secretions, lack of smell x 2005, ear ache,   fever, chills, sweats, unintended wt loss, classically pleuritic or exertional cp,  orthopnea pnd or leg swelling, presyncope, palpitations, abdominal pain, anorexia,  nausea, vomiting, diarrhea  or change in bowel or bladder habits, change in stools or urine, dysuria,hematuria,  rash, arthralgias, visual complaints, headache, numbness, weakness or tremor or ataxia or problems with walking or coordination,  change in mood/affect or memory.                  Review of Systems     Objective:   Physical Exam  amb pleasant bf   Wt Readings from Last 3 Encounters:  05/03/15 162 lb (73.483 kg)  03/23/15 169 lb 8 oz (76.885 kg)  10/12/14 167 lb 6.4 oz (75.932 kg)    Vital signs reviewed   HEENT: nl dentition, turbinates, and oropharynx. Nl external ear canals without cough reflex   NECK :  without JVD/Nodes/TM/ nl carotid upstrokes bilaterally   LUNGS: no acc muscle use,  Nl contour chest which is clear to A and P bilaterally without cough on insp or exp maneuvers   CV:  RRR  no s3 or murmur or increase in P2, no edema   ABD:  soft and nontender with nl inspiratory excursion in the supine position. No bruits or organomegaly, bowel sounds nl  MS:  Nl gait/ ext warm without deformities, calf tenderness, cyanosis or clubbing No obvious joint restrictions   SKIN: warm and dry without lesions    NEURO:  alert, approp, nl sensorium with  no motor deficits - classic resting tremor both hands     CXR PA and Lateral:   05/03/2015 :  I personally reviewed images and agree with radiology impression as follows:    Mild hyperinflation may reflect acute and/or chronic bronchitic change. There is no evidence of pneumonia or CHF.   Marland Kitchen    Assessment:

## 2015-05-03 NOTE — Assessment & Plan Note (Addendum)
The most common causes of chronic cough in immunocompetent adults include the following: upper airway cough syndrome (UACS), previously referred to as postnasal drip syndrome (PNDS), which is caused by variety of rhinosinus conditions; (2) asthma; (3) GERD; (4) chronic bronchitis from cigarette smoking or other inhaled environmental irritants; (5) nonasthmatic eosinophilic bronchitis; and (6) bronchiectasis.   These conditions, singly or in combination, have accounted for up to 94% of the causes of chronic cough in prospective studies.   Other conditions have constituted no >6% of the causes in prospective studies These have included bronchogenic carcinoma, chronic interstitial pneumonia, sarcoidosis, left ventricular failure, ACEI-induced cough, and aspiration from a condition associated with pharyngeal dysfunction.    Chronic cough is often simultaneously caused by more than one condition. A single cause has been found from 38 to 82% of the time, multiple causes from 18 to 62%. Multiply caused cough has been the result of three diseases up to 42% of the time.       Based on hx and exam, this is most likely:  Classic Upper airway cough syndrome, so named because it's frequently impossible to sort out how much is  CR/sinusitis with freq throat clearing (which can be related to primary GERD)   vs  causing  secondary (" extra esophageal")  GERD from wide swings in gastric pressure that occur with throat clearing, often  promoting self use of mint and menthol lozenges that reduce the lower esophageal sphincter tone and exacerbate the problem further in a cyclical fashion.   These are the same pts (now being labeled as having "irritable larynx syndrome" by some cough centers) who not infrequently have a history of having failed to tolerate ace inhibitors,  dry powder inhalers or biphosphonates or report having atypical reflux symptoms that don't respond to standard doses of PPI , and are easily confused as  having aecopd or asthma flares by even experienced allergists/ pulmonologists.   The first step is to maximize GERD rx  and eliminate cyclical coughing then regroup if the cough persists with sinus ct/ allergy/asthma eval next if not better  I had an extended discussion with the patient reviewing all relevant studies completed to date and  lasting 35/60  Min ov  1) Explained the natural history of uri and why it's necessary in patients at risk to treat GERD aggressively - at least  short term -   to reduce risk of evolving cyclical cough initially  triggered by epithelial injury and a heightened sensitivty to the effects of any upper airway irritants,  most importantly acid - related - then perpetuated by epithelial injury related to the cough itself as the upper airway collapses on itself.  That is, the more sensitive the epithelium becomes once it is damaged by the virus, the more the ensuing irritability> the more the cough, the more the secondary reflux (especially in those prone to reflux) the more the irritation of the sensitive mucosa and so on in a  Classic cyclical pattern.    2)  Each maintenance medication was reviewed in detail including most importantly the difference between maintenance and prns and under what circumstances the prns are to be triggered using an action plan format that is not reflected in the computer generated alphabetically organized AVS.    Please see instructions for details which were reviewed in writing and the patient given a copy highlighting the part that I personally wrote and discussed at today's ov.   See instructions for specific recommendations which were  reviewed directly with the patient who was given a copy with highlighter outlining the key components.

## 2015-06-12 ENCOUNTER — Telehealth: Payer: Self-pay | Admitting: Neurology

## 2015-06-12 DIAGNOSIS — G20 Parkinson's disease: Secondary | ICD-10-CM

## 2015-06-12 NOTE — Telephone Encounter (Signed)
Patient called to advise, Dr. Anne HahnWillis diagnosed her with Parkinson's and recommended physical therapy, exercise class, weight training that would help with Parkinson's. Would like to know if this is something Dr. Anne HahnWillis could write a prescription for, so that insurance would cover it? Please call to advise.

## 2015-06-12 NOTE — Telephone Encounter (Signed)
I called the patient. The patient is stating getting involved with an exercise program. I will get her referred to the ACT program spots about physical therapy.

## 2015-06-28 ENCOUNTER — Ambulatory Visit (HOSPITAL_COMMUNITY): Payer: BC Managed Care – PPO | Attending: Neurology | Admitting: Physical Therapy

## 2015-06-28 DIAGNOSIS — R2681 Unsteadiness on feet: Secondary | ICD-10-CM | POA: Diagnosis present

## 2015-06-28 DIAGNOSIS — M6281 Muscle weakness (generalized): Secondary | ICD-10-CM | POA: Diagnosis present

## 2015-06-28 DIAGNOSIS — R262 Difficulty in walking, not elsewhere classified: Secondary | ICD-10-CM | POA: Diagnosis not present

## 2015-06-28 DIAGNOSIS — R279 Unspecified lack of coordination: Secondary | ICD-10-CM | POA: Diagnosis present

## 2015-06-28 NOTE — Therapy (Signed)
[facility] [address] [place], [place], [address] Phone: [phone]   Fax:  [phone]  Physical Therapy Evaluation  Patient Details  Name: [name] [name] MRN: [id number] Date of Birth: 02-22-1951 Referring Provider: York Spaniel   Encounter Date: 06/28/2015      PT End of Session - 06/28/15 1222    Visit Number 1   Number of Visits 12   Date for PT Re-Evaluation 07/19/15   Authorization Type [employer] PPO Blue    Authorization Time Period 06/28/15 to 08/09/15   PT Start Time 0815   PT Stop Time 0900   PT Time Calculation (min) 45 min   Activity Tolerance Patient tolerated treatment well   Behavior During Therapy [facility] for tasks assessed/performed      Past Medical History  Diagnosis Date  . Bursitis   . Cellulitis     arm  . Headache   . Insomnia   . Hyperlipidemia   . Onychomycosis   . Osteoarthritis   . OA (osteoarthritis)   . Tenosynovitis   . RLS (restless legs syndrome) 03/23/2015  . Parkinson disease (HCC) 03/23/2015    Past Surgical History  Procedure Laterality Date  . Tubal ligation      There were no vitals filed for this visit.       Subjective Assessment - 06/28/15 0818    Subjective Patient reports that her symptoms started with her index finger just twitching and involuntary movements, progressed to whole hand. At this point she went to MD, who initially had her rest; 3-6 months later, MD then diagnosed her with Parkinsons as her arm, hand, and L leg are all now tremoring. She reports that tasks like going into another room will trigger a small freezing episodes, can take her a moment to break out of this. She has been stumbling, feels more off balance more to the left; she states that she feels like she is floating sometimes, has had some episodes of dizziness as well but it is not constant. Does report some episodes of festination going backwards. She has also had some close calls with falls, has been able  to catch herself on walls so far. Her husband does hold her hand or arm while she walks currently.    Pertinent History hx meningitis, hx of hepatitis, RLS, Parkinsons    Patient Stated Goals improve balance   Currently in Pain? No/denies            [facility] PT Assessment - 06/28/15 0001    Assessment   Medical Diagnosis Parkinsons    Referring Provider York Spaniel    Onset Date/Surgical Date --  October of 2016; officially diagnosed in January 2017   Next MD Visit Dr. Anne Hahn June the 26th    Precautions   Precautions Fall   Balance Screen   Has the patient fallen in the past 6 months No   Has the patient had a decrease in activity level because of a fear of falling?  Yes   Is the patient reluctant to leave their home because of a fear of falling?  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    Leisure walking    Observation/Other Assessments   Observations RAM intact; mild rigidity L UE; unable to provoke freezing walking over different surfaces and  through door ways; some mild difficulty with backwards gait, reduced clearance L LE; difficulty maintaining balance with sudden stops/starts    Strength   Right Hip Flexion 3/5   Right Hip Extension 3-/5   Right Hip ABduction 2+/5   Left Hip Flexion 3/5   Left Hip Extension 2/5   Left Hip ABduction 2/5   Right Knee Flexion 4/5   Right Knee Extension 4-/5   Left Knee Flexion 3+/5   Left Knee Extension 4-/5   Right Ankle Dorsiflexion 4-/5   Left Ankle Dorsiflexion 3/5   Ambulation/Gait   Gait Comments proximal muscle weakness, mild unsteadiness, some mild difficulty with toe clearnce, difficiulty with foot clearance backwards    6 minute walk test results    Aerobic Endurance Distance Walked 791   Endurance additional comments  , no device, no breaks    Berg Balance Test   Sit to Stand Able to stand without using hands and stabilize independently   Standing Unsupported Able to stand safely 2 minutes   Sitting with Back Unsupported but Feet Supported on Floor or Stool Able to sit safely and securely 2 minutes   Stand to Sit Sits safely with minimal use of hands   Transfers Able to transfer safely, minor use of hands   Standing Unsupported with Eyes Closed Able to stand 10 seconds safely   Standing Ubsupported with Feet Together Able to place feet together independently and stand 1 minute safely   From Standing, Reach Forward with Outstretched Arm Can reach confidently >25 cm (10")   From Standing Position, Pick up Object from Floor Able to pick up shoe safely and easily   From Standing Position, Turn to Look Behind Over each Shoulder Looks behind from both sides and weight shifts well   Turn 360 Degrees Able to turn 360 degrees safely one side only in 4 seconds or less   Standing Unsupported, Alternately Place Feet on Step/Stool Able to stand independently and safely and complete 8 steps in 20 seconds   Standing Unsupported, One Foot in [employer] balance while stepping or standing   Standing on One Leg Unable to try or needs assist to prevent fall   Total Score 47                           PT Education - 06/28/15 1222    Education provided Yes   Education Details general parkinsons information, prognosis, plan of care, HEP, PWR! training and cardio    Person(s) Educated Patient;Spouse   Methods Explanation;Demonstration;Handout   Comprehension Verbalized understanding;Returned demonstration;Need further instruction          PT Short Term Goals - 06/28/15 1236    PT SHORT TERM GOAL #1   Title Patient to be able to perform backwards and forwards gait with good foot clearance, minimal shuffling, upright posture, and minimal unsteadiness in order to demonstrate improvement in overall  mobility    Time 3   Period Weeks   Status New   PT SHORT TERM GOAL #2   Title Patient to score at least a 50 on the BERG in order to demosntrate reduced fall risk    Time 3   Period Weeks   Status New   PT SHORT TERM GOAL #3   Title Patient to be independent in techniques to manage freezing episodes, such as backward stepping, in order to improve overall ease of mobility and to increase self-efficacy    Time  3   Period Weeks   Status New   PT SHORT TERM GOAL #4   Title Patient to report no falls or close calls within the past 3 weeks in order to demonstrate general improvement in balance and safety    Time 3   Period Weeks   Status New   PT SHORT TERM GOAL #5   Title Patient to be independent in performing appropriate HEP, to be updated PRN    Time 3   Period Weeks   Status New           PT Long Term Goals - 06/28/15 1239    PT LONG TERM GOAL #1   Title Patient to demonstrate strength 4+/5 in order to improve overall mechanics and reduce fall risk    Time 6   Period Weeks   Status New   PT LONG TERM GOAL #2   Title Patient to score at least a 53 on the BERG in order to demonstrate improved safety and reduced fall risk    Time 6   Period Weeks   Status New   PT LONG TERM GOAL #3   Title Patient to be performing at least 30-45 minutes of moderate-high intensity aerobic activity, at least 4 times per week, in order to improve overall health and assist in mitigating parkinsonian symtpmos    Time 6   Period Weeks   Status New   PT LONG TERM GOAL #4   Title Patient to be educated about and participatory in local and regional Parkinsons support and activity groups in order to improve overall mood and self-efficacy in managing condition    Time 6   Period Weeks   Status New   PT LONG TERM GOAL #5   Title Patient to be able to perform dual tasking activities with minimal unsteadiness in order to assist in function in community, home, and at work    Time 6   Period Weeks    Status New               Plan - 06/28/15 1223    Clinical Impression Statement Patient arrives with recent diagnosis of Parkinsons Disease; she reports she is not on any medication for it at the moment, and is generally most concerned about her balance as she has many close calls with almost falling. She sstates high level of interest in potentially working with speech therapy and PWR! OT as well. Upon examination, patient does reveal classic parkinsonian symptoms including unsteadiness with tendnecy for posterior LOB, gait impairment, muscle weakness, and occasional freezing episodes. Due to her relatively young age of diagnosis, and early phase in the spectrum of parkinsons, she will strongly benefit from skilled PT services with aggressive approach twoards PWR! training, aerobic activity, balance training, and overall reduction of fall risk/optimization of function.   Rehab Potential Excellent   PT Frequency 2x / week   PT Duration 6 weeks   PT Treatment/Interventions ADLs/Self Care Home Management;Biofeedback;Gait training;Stair training;Functional mobility training;Therapeutic activities;Therapeutic exercise;Balance training;Neuromuscular re-education;Patient/family education;Manual techniques;Energy conservation;Taping   PT Next Visit Plan review HEP and goals; treadmill training, quarduped/seated/standing PWR!, functioanl strength and balance    PT Home Exercise Plan seated PWR! and pedometer with instructions for beginning regular walking program    Recommended Other Services speech therapy and PWR! OT    Consulted and Agree with Plan of Care Patient      Patient will benefit from skilled therapeutic intervention in order to improve the following deficits and  impairments:  Abnormal gait, Decreased strength, Decreased balance, Decreased mobility, Difficulty walking, Decreased coordination, Postural dysfunction, Decreased activity tolerance  Visit Diagnosis: Difficulty in walking,  not elsewhere classified - Plan: PT plan of care cert/re-cert  Unsteadiness on feet - Plan: PT plan of care cert/re-cert  Muscle weakness (generalized) - Plan: PT plan of care cert/re-cert  Unspecified lack of coordination - Plan: PT plan of care cert/re-cert     Problem List Patient Active Problem List   Diagnosis Date Noted  . Upper airway cough syndrome 05/03/2015  . RLS (restless legs syndrome) 03/23/2015  . Parkinson disease (HCC) 03/23/2015  . Fever 08/29/2011  . Headache(784.0) 08/29/2011  . Knee pain, right 08/29/2011  . Back pain 08/29/2011  . Bacteremia due to Gram-positive bacteria 08/29/2011  . Hepatitis 08/29/2011  . Periorbital edema 08/29/2011  . Facial rash 08/29/2011  . Blurred vision, right eye 08/29/2011  . Tremor 08/29/2011  . Meningitis 08/29/2011  . CHEST PAIN UNSPECIFIED 03/04/2007  . PNEUMONIA, LEFT LOWER LOBE 03/03/2007  . COUGH, CHRONIC 03/03/2007    Nedra HaiKristen Scottie Stanish PT, DPT [phone]  [facility] [address] [place], [place], [address] Phone: [phone]   Fax:  [phone]  Name: Areatha KeasStormy P Thoreson MRN: [id number] Date of Birth: 1950-10-26

## 2015-07-04 ENCOUNTER — Ambulatory Visit (HOSPITAL_COMMUNITY): Payer: BC Managed Care – PPO | Admitting: Physical Therapy

## 2015-07-04 DIAGNOSIS — R2681 Unsteadiness on feet: Secondary | ICD-10-CM

## 2015-07-04 DIAGNOSIS — R262 Difficulty in walking, not elsewhere classified: Secondary | ICD-10-CM | POA: Diagnosis not present

## 2015-07-04 DIAGNOSIS — M6281 Muscle weakness (generalized): Secondary | ICD-10-CM

## 2015-07-04 DIAGNOSIS — R279 Unspecified lack of coordination: Secondary | ICD-10-CM

## 2015-07-04 NOTE — Therapy (Signed)
[facility] [address] [place], [place], [address] Phone: [phone]   Fax:  [phone]  Physical Therapy Treatment  Patient Details  Name: [name] [name] MRN: [id number] Date of Birth: [birth year] Referring Provider: York Spanielharles K Willis   Encounter Date: 07/04/2015      PT End of Session - 07/04/15 1744    Visit Number 2   Number of Visits 12   Date for PT Re-Evaluation 07/19/15   Authorization Type [employer] PPO Blue    Authorization Time Period 06/28/15 to 08/09/15   PT Start Time 1651  patient a few minutes late    PT Stop Time 1734   PT Time Calculation (min) 43 min   Activity Tolerance Patient tolerated treatment well   Behavior [facility] Therapy [facility] for tasks assessed/performed      Past Medical History  Diagnosis Date  . Bursitis   . Cellulitis     arm  . Headache   . Insomnia   . Hyperlipidemia   . Onychomycosis   . Osteoarthritis   . OA (osteoarthritis)   . Tenosynovitis   . RLS (restless legs syndrome) 03/23/2015  . Parkinson disease (HCC) 03/23/2015    Past Surgical History  Procedure Laterality Date  . Tubal ligation      There were no vitals filed for this visit.      Subjective Assessment - 07/04/15 1738    Subjective Patient arrives reporting she is doing very well, actually did some treadmill work before she came today; she also reports that she has noticed more tremor when she is moving her L arm actively. Has some questions about HEP.    Pertinent History hx meningitis, hx of hepatitis, RLS, Parkinsons    Patient Stated Goals improve balance   Currently in Pain? No/denies                         [facility] Adult PT Treatment/Exercise - 07/04/15 0001    Ambulation/Gait   Gait Comments gait x25100ft dual tasking with juggling scarf, min guard    Knee/Hip Exercises: Aerobic   Tread Mill x4 minutes on treadmill at pace apprximately 3.4-3.7MPH, occasional PT cues for form and heel-toe             PWR [facility] - 07/04/15 1741    PWR! Up 1x10 standing    PWR! Rock 1x20 standing    PWR! Twist 1x20 standing    PWR Step 1x20 standing    Comments min cues for form    PWR! Up 1x20 seated    PWR! Rock 1x20 seated   PWR! Twist 1x20 seated   PWR! Step 1x20 seated   Comments min cues for form              PT Education - 07/04/15 1743    Education provided Yes   Education Details reviewed HEP and initial eval/goals; instructed to included treadmill in HEP; parkinsons and dual tasking    Person(s) Educated Patient   Methods Explanation;Handout   Comprehension Verbalized understanding          PT Short Term Goals - 06/28/15 1236    PT SHORT TERM GOAL #1   Title Patient to be able to perform backwards and forwards gait with good foot clearance, minimal shuffling, upright posture, and minimal unsteadiness in order to demonstrate improvement in overall mobility    Time 3   Period Weeks   Status New   PT  SHORT TERM GOAL #2   Title Patient to score at least a 50 on the BERG in order to demosntrate reduced fall risk    Time 3   Period Weeks   Status New   PT SHORT TERM GOAL #3   Title Patient to be independent in techniques to manage freezing episodes, such as backward stepping, in order to improve overall ease of mobility and to increase self-efficacy    Time 3   Period Weeks   Status New   PT SHORT TERM GOAL #4   Title Patient to report no falls or close calls within the past 3 weeks in order to demonstrate general improvement in balance and safety    Time 3   Period Weeks   Status New   PT SHORT TERM GOAL #5   Title Patient to be independent in performing appropriate HEP, to be updated PRN    Time 3   Period Weeks   Status New           PT Long Term Goals - 06/28/15 1239    PT LONG TERM GOAL #1   Title Patient to demonstrate strength 4+/5 in order to improve overall mechanics and reduce fall risk    Time 6   Period Weeks   Status New   PT LONG TERM GOAL  #2   Title Patient to score at least a 53 on the BERG in order to demonstrate improved safety and reduced fall risk    Time 6   Period Weeks   Status New   PT LONG TERM GOAL #3   Title Patient to be performing at least 30-45 minutes of moderate-high intensity aerobic activity, at least 4 times per week, in order to improve overall health and assist in mitigating parkinsonian symtpmos    Time 6   Period Weeks   Status New   PT LONG TERM GOAL #4   Title Patient to be educated about and participatory in local and regional Parkinsons support and activity groups in order to improve overall mood and self-efficacy in managing condition    Time 6   Period Weeks   Status New   PT LONG TERM GOAL #5   Title Patient to be able to perform dual tasking activities with minimal unsteadiness in order to assist in function in community, home, and at work    Time 6   Period Weeks   Status New               Plan - 07/04/15 1744    Clinical Impression Statement Began session with gait at self-selected pace on treadmill today, with only occasional cues for gait mechanics. Followed this immediately by forwards and backwards gait; did notice increased difficulty with foot/LE clearance and stride length more so on L than R today with backwards gait pattern. Continued with seated PWR moves today, and introduced standing PWR moves as well with close supervision and min cues for correct form throughout all PWR activities. Finished sesion with dual tasking activity involving gait with juggling scarf with min guard and mild-moderate difficulty from patient. Reviewed initial eval and goals at end of session; also recommended that patient include treadmill work independently as part of her HEP.    Rehab Potential Excellent   PT Frequency 2x / week   PT Duration 6 weeks   PT Treatment/Interventions ADLs/Self Care Home Management;Biofeedback;Gait training;Stair training;Functional mobility training;Therapeutic  activities;Therapeutic exercise;Balance training;Neuromuscular re-education;Patient/family education;Manual techniques;Energy conservation;Taping   PT Next Visit Plan  quarduped/seated/standing PWR!, functioanl strength and balance. Check in on speech/OT referrals.    PT Home Exercise Plan added treadmill work    Consulted and Agree with Plan of Care Patient      Patient will benefit from skilled therapeutic intervention in order to improve the following deficits and impairments:  Abnormal gait, Decreased strength, Decreased balance, Decreased mobility, Difficulty walking, Decreased coordination, Postural dysfunction, Decreased activity tolerance  Visit Diagnosis: Difficulty in walking, not elsewhere classified  Unsteadiness on feet  Muscle weakness (generalized)  Unspecified lack of coordination     Problem List Patient Active Problem List   Diagnosis Date Noted  . Upper airway cough syndrome 05/03/2015  . RLS (restless legs syndrome) 03/23/2015  . Parkinson disease (HCC) 03/23/2015  . Fever 08/29/2011  . Headache(784.0) 08/29/2011  . Knee pain, right 08/29/2011  . Back pain 08/29/2011  . Bacteremia due to Gram-positive bacteria 08/29/2011  . Hepatitis 08/29/2011  . Periorbital edema 08/29/2011  . Facial rash 08/29/2011  . Blurred vision, right eye 08/29/2011  . Tremor 08/29/2011  . Meningitis 08/29/2011  . CHEST PAIN UNSPECIFIED 03/04/2007  . PNEUMONIA, LEFT LOWER LOBE 03/03/2007  . COUGH, CHRONIC 03/03/2007    Nedra Hai PT, DPT [id number]  [facility] [address] [place], [place], [address] Phone: [phone]   Fax:  [phone]  Name: [name] [name] MRN: [id number] Date of Birth: April 02, 1950

## 2015-07-12 ENCOUNTER — Encounter (HOSPITAL_COMMUNITY): Payer: BC Managed Care – PPO | Admitting: Physical Therapy

## 2015-07-13 ENCOUNTER — Ambulatory Visit (HOSPITAL_COMMUNITY): Payer: BC Managed Care – PPO | Admitting: Physical Therapy

## 2015-07-13 DIAGNOSIS — R262 Difficulty in walking, not elsewhere classified: Secondary | ICD-10-CM

## 2015-07-13 DIAGNOSIS — M6281 Muscle weakness (generalized): Secondary | ICD-10-CM

## 2015-07-13 DIAGNOSIS — R2681 Unsteadiness on feet: Secondary | ICD-10-CM

## 2015-07-13 DIAGNOSIS — R279 Unspecified lack of coordination: Secondary | ICD-10-CM

## 2015-07-13 NOTE — Therapy (Signed)
[facility] [address] [place], [place], [address] Phone: [phone]   Fax:  [phone]  Physical Therapy Treatment  Patient Details  Name: [name] [name] MRN: [id number] Date of Birth: 1951-02-04 Referring Provider: York Spanielharles K Willis   Encounter Date: 07/13/2015      PT End of Session - 07/13/15 1821    Visit Number 3   Number of Visits 12   Date for PT Re-Evaluation 07/19/15   Authorization Type [employer] PPO Blue    Authorization Time Period 06/28/15 to 08/09/15   PT Start Time 1601   PT Stop Time 1643   PT Time Calculation (min) 42 min   Activity Tolerance Patient tolerated treatment well   Behavior [facility] assessed/performed      Past Medical History  Diagnosis Date  . Bursitis   . Cellulitis     arm  . Headache   . Insomnia   . Hyperlipidemia   . Onychomycosis   . Osteoarthritis   . OA (osteoarthritis)   . Tenosynovitis   . RLS (restless legs syndrome) 03/23/2015  . Parkinson disease (HCC) 03/23/2015    Past Surgical History  Procedure Laterality Date  . Tubal ligation      There were no vitals filed for this visit.      Subjective Assessment - 07/13/15 1815    Subjective Patient arrives today stating she felt fatigued after last session but did feel good; no pain today   Patient Stated Goals improve balance   Currently in Pain? No/denies                         [facility] - 07/13/15 0001    Ambulation/Gait   Gait Comments backwards gait  3x1330ft, cues for form; gait 1x45652ft with single juggling scarf; gait 1x41352ft with double juggling scarves; reciprocal hiking pole walking normal stride 1x45552ft;reciprocal hiking pole walking with high arms/knees; backwards gait with single juggling scarf toss 3x7430ft    Knee/Hip Exercises: Aerobic   Tread Mill x6 minutes at 3.4MPH, 7% incline   supervised to ensure safety with incline (new setting)            PWR [facility]) - 07/13/15 1819    PWR! Up 1x6 standing    PWR! Rock 1x6 standing    PWR! Twist 1x6 standing    PWR Step 1x6 standing    Comments min cues, supervision    PWR! Up 1x6 seated    PWR! Rock 1x6 seated    PWR! Twist 1x6 seated    PWR! Step 1x6 seated    Comments min cues for form              PT Education - 07/13/15 1820    Education provided Yes   Education Details use of reciprocal hiking poles/canes for gait based exercise at home; reviewed PWR form; importance of HIIT for [employer]    Person(s) Educated Patient   Methods Explanation   Comprehension Verbalized understanding          PT Short Term Goals - 06/28/15 1236    PT SHORT TERM GOAL #1   Title Patient to be able to perform backwards and forwards gait with good foot clearance, minimal shuffling, upright posture, and minimal unsteadiness in order to demonstrate improvement in overall mobility    Time 3   Period Weeks   Status New   PT SHORT TERM GOAL #2  Title Patient to score at least a 50 on the BERG in order to demosntrate reduced fall risk    Time 3   Period Weeks   Status New   PT SHORT TERM GOAL #3   Title Patient to be independent in techniques to manage freezing episodes, such as backward stepping, in order to improve overall ease of mobility and to increase self-efficacy    Time 3   Period Weeks   Status New   PT SHORT TERM GOAL #4   Title Patient to report no falls or close calls within the past 3 weeks in order to demonstrate general improvement in balance and safety    Time 3   Period Weeks   Status New   PT SHORT TERM GOAL #5   Title Patient to be independent in performing appropriate HEP, to be updated PRN    Time 3   Period Weeks   Status New           PT Long Term Goals - 06/28/15 1239    PT LONG TERM GOAL #1   Title Patient to demonstrate strength 4+/5 in order to improve overall mechanics and reduce fall risk    Time 6   Period Weeks   Status New   PT LONG TERM  GOAL #2   Title Patient to score at least a 53 on the BERG in order to demonstrate improved safety and reduced fall risk    Time 6   Period Weeks   Status New   PT LONG TERM GOAL #3   Title Patient to be performing at least 30-45 minutes of moderate-high intensity aerobic activity, at least 4 times per week, in order to improve overall health and assist in mitigating parkinsonian symtpmos    Time 6   Period Weeks   Status New   PT LONG TERM GOAL #4   Title Patient to be educated about and participatory in local and regional Parkinsons support and activity groups in order to improve overall mood and self-efficacy in managing condition    Time 6   Period Weeks   Status New   PT LONG TERM GOAL #5   Title Patient to be able to perform dual tasking activities with minimal unsteadiness in order to assist in function in community, home, and at work    Time 6   Period Weeks   Status New               Plan - 07/13/15 1821    Clinical Impression Statement Began session with advanced settings on inclined treadmill today; also performed aggressive gait training involving dual tasking with juggling scarves and bilatteral hiking poles today, with min guard and cues for correct form throughout. Some difficulty noted with more advanced dual tasking activities, espeically reciporcal hiking pole tasks. Also performed seated and stanidng PWR exercises today at end of session as well, with only occasional-min cues for form. Educated patientn on improtance of dual tasking training and HIIT acitivtty for [employer]. Patient reported RPE 10/10 today at end of session.    Rehab Potential Excellent   PT Frequency 2x / week   PT Duration 6 weeks   PT Treatment/Interventions ADLs/Self Care Home Management;Biofeedback;Gait training;Stair training;Functional mobility training;Therapeutic activities;Therapeutic exercise;Balance training;Neuromuscular re-education;Patient/family education;Manual  techniques;Energy conservation;Taping   PT Next Visit Plan  quarduped/seated/standing PWR!, functioanl strength and balance. Check in on speech/OT referrals. Progress towards skipping/hopping/HIIT baseed activiteis.    Consulted and Agree with Plan of Care Patient  Patient will benefit from skilled therapeutic intervention in order to improve the following deficits and impairments:  Abnormal gait, Decreased strength, Decreased balance, Decreased mobility, Difficulty walking, Decreased coordination, Postural dysfunction, Decreased activity tolerance  Visit Diagnosis: Difficulty in walking, not elsewhere classified  Unsteadiness on feet  Muscle weakness (generalized)  Unspecified lack of coordination     Problem List Patient Active Problem List   Diagnosis Date Noted  . Upper airway cough syndrome 05/03/2015  . RLS (restless legs syndrome) 03/23/2015  . Parkinson disease (HCC) 03/23/2015  . Fever 08/29/2011  . Headache(784.0) 08/29/2011  . Knee pain, right 08/29/2011  . Back pain 08/29/2011  . Bacteremia due to Gram-positive bacteria 08/29/2011  . Hepatitis 08/29/2011  . Periorbital edema 08/29/2011  . Facial rash 08/29/2011  . Blurred vision, right eye 08/29/2011  . Tremor 08/29/2011  . Meningitis 08/29/2011  . CHEST PAIN UNSPECIFIED 03/04/2007  . PNEUMONIA, LEFT LOWER LOBE 03/03/2007  . COUGH, CHRONIC 03/03/2007   Nedra Hai PT, DPT [phone]  [facility] [address] [place], [place], [address] Phone: [phone]   Fax:  [phone]  Name: [name] [name] MRN: [id number] Date of Birth: 12-18-1950

## 2015-07-19 ENCOUNTER — Encounter (HOSPITAL_COMMUNITY): Payer: BC Managed Care – PPO | Admitting: Occupational Therapy

## 2015-07-19 ENCOUNTER — Ambulatory Visit (HOSPITAL_COMMUNITY): Payer: BC Managed Care – PPO | Admitting: Physical Therapy

## 2015-07-19 DIAGNOSIS — R2681 Unsteadiness on feet: Secondary | ICD-10-CM

## 2015-07-19 DIAGNOSIS — R279 Unspecified lack of coordination: Secondary | ICD-10-CM

## 2015-07-19 DIAGNOSIS — R262 Difficulty in walking, not elsewhere classified: Secondary | ICD-10-CM

## 2015-07-19 DIAGNOSIS — M6281 Muscle weakness (generalized): Secondary | ICD-10-CM

## 2015-07-19 NOTE — Therapy (Signed)
[facility] [address] [place], [place], [address] Phone: [phone]   Fax:  [phone]  Physical Therapy Treatment  Patient Details  Name: [name] [name] MRN: [id number] Date of Birth: 1950-12-03 Referring Provider: York Spanielharles K Willis   Encounter Date: 07/19/2015      PT End of Session - 07/19/15 1659    Visit Number 4   Number of Visits 12   Date for PT Re-Evaluation 07/19/15   Authorization Type [employer] PPO Blue    Authorization Time Period 06/28/15 to 08/09/15   PT Start Time 1602   PT Stop Time 1642   PT Time Calculation (min) 40 min   Activity Tolerance Patient tolerated treatment well   Behavior During Therapy [facility] tasks assessed/performed      Past Medical History  Diagnosis Date  . Bursitis   . Cellulitis     arm  . Headache   . Insomnia   . Hyperlipidemia   . Onychomycosis   . Osteoarthritis   . OA (osteoarthritis)   . Tenosynovitis   . RLS (restless legs syndrome) 03/23/2015  . Parkinson disease (HCC) 03/23/2015    Past Surgical History  Procedure Laterality Date  . Tubal ligation      There were no vitals filed for this visit.      Subjective Assessment - 07/19/15 1653    Subjective Patient arrives today stating she felt very energized after last session, no pain today at all and states she is going re-schedule OT appointment after school is over    Currently in Pain? No/denies                         [facility]Exercise - 07/19/15 0001    Ambulation/Gait   Gait Comments backwards gait 3x4730ft; gait 1x46952ft with 2 juggling scarves; gait backwards 3x8130ft with single juggling scarf; high knee/high hip gait with hiking poles 1x23226ft    Knee/Hip Exercises: Aerobic   Tread Mill x6 minutes at 2.4-3.3 MPH, incline 8%   supervision for safety            [facility]) - 07/19/15 1657    PWR! Up 1x10 qudruped, cues for form    PWR! Rock 1x20 quadruped, cues for form    PWR!  Twist 1x10 cues for form   quadruped    PWR! Step 1x10 cues for form   quadruped    Comments fatigue noted in core              PT Education - 07/19/15 1659    Education provided No          PT Short Term Goals - 06/28/15 1236    PT SHORT TERM GOAL #1   Title Patient to be able to perform backwards and forwards gait with good foot clearance, minimal shuffling, upright posture, and minimal unsteadiness in order to demonstrate improvement in overall mobility    Time 3   Period Weeks   Status New   PT SHORT TERM GOAL #2   Title Patient to score at least a 50 on the BERG in order to demosntrate reduced fall risk    Time 3   Period Weeks   Status New   PT SHORT TERM GOAL #3   Title Patient to be independent in techniques to manage freezing episodes, such as backward stepping, in order to improve overall ease of mobility and to increase self-efficacy  Time 3   Period Weeks   Status New   PT SHORT TERM GOAL #4   Title Patient to report no falls or close calls within the past 3 weeks in order to demonstrate general improvement in balance and safety    Time 3   Period Weeks   Status New   PT SHORT TERM GOAL #5   Title Patient to be independent in performing appropriate HEP, to be updated PRN    Time 3   Period Weeks   Status New           PT Long Term Goals - 06/28/15 1239    PT LONG TERM GOAL #1   Title Patient to demonstrate strength 4+/5 in order to improve overall mechanics and reduce fall risk    Time 6   Period Weeks   Status New   PT LONG TERM GOAL #2   Title Patient to score at least a 53 on the BERG in order to demonstrate improved safety and reduced fall risk    Time 6   Period Weeks   Status New   PT LONG TERM GOAL #3   Title Patient to be performing at least 30-45 minutes of moderate-high intensity aerobic activity, at least 4 times per week, in order to improve overall health and assist in mitigating parkinsonian symtpmos    Time 6   Period  Weeks   Status New   PT LONG TERM GOAL #4   Title Patient to be educated about and participatory in local and regional Parkinsons support and activity groups in order to improve overall mood and self-efficacy in managing condition    Time 6   Period Weeks   Status New   PT LONG TERM GOAL #5   Title Patient to be able to perform dual tasking activities with minimal unsteadiness in order to assist in function in community, home, and at work    Time 6   Period Weeks   Status New               Plan - 07/19/15 1700    Clinical Impression Statement Began session with advanced settings on inclined treadmill today, also continued aggressive dual tasking training with juggling scarves forwards and backwards, also with bilateral hiking poles. Introduced quadruped [employer] moves which patient was fatiuged by, demonstrating some possible impaired core endurance. Finished session with high knees/high hips with hiking poles and cues for as large of movements as possible. Patient reported RPE 10/10 at end of session.    Rehab Potential Excellent   PT Frequency 2x / week   PT Duration 6 weeks   PT Treatment/Interventions ADLs/Self Care Home Management;Biofeedback;Gait training;Stair training;Functional mobility training;Therapeutic activities;Therapeutic exercise;Balance training;Neuromuscular re-education;Patient/family education;Manual techniques;Energy conservation;Taping   PT Next Visit Plan  Re-assess next session. quarduped/seated/standing PWR!, functioanl strength and balance.  Progress towards skipping/hopping/HIIT baseed activiteis.    Consulted and Agree with Plan of Care Patient      Patient will benefit from skilled therapeutic intervention in order to improve the following deficits and impairments:  Abnormal gait, Decreased strength, Decreased balance, Decreased mobility, Difficulty walking, Decreased coordination, Postural dysfunction, Decreased activity tolerance  Visit  Diagnosis: Difficulty in walking, not elsewhere classified  Unsteadiness on feet  Muscle weakness (generalized)  Unspecified lack of coordination     Problem List Patient Active Problem List   Diagnosis Date Noted  . Upper airway cough syndrome 05/03/2015  . RLS (restless legs syndrome) 03/23/2015  . Parkinson disease (HCC)  03/23/2015  . Fever 08/29/2011  . Headache(784.0) 08/29/2011  . Knee pain, right 08/29/2011  . Back pain 08/29/2011  . Bacteremia due to Gram-positive bacteria 08/29/2011  . Hepatitis 08/29/2011  . Periorbital edema 08/29/2011  . Facial rash 08/29/2011  . Blurred vision, right eye 08/29/2011  . Tremor 08/29/2011  . Meningitis 08/29/2011  . CHEST PAIN UNSPECIFIED 03/04/2007  . PNEUMONIA, LEFT LOWER LOBE 03/03/2007  . COUGH, CHRONIC 03/03/2007    Nedra Hai PT, DPT [phone]  [facility] [address] [place], [place], [address] Phone: [phone]   Fax:  [phone]  Name: [name] [name] MRN: [id number] Date of Birth: 1950-11-03

## 2015-07-20 ENCOUNTER — Ambulatory Visit (HOSPITAL_COMMUNITY): Payer: BC Managed Care – PPO | Admitting: Physical Therapy

## 2015-07-20 DIAGNOSIS — R279 Unspecified lack of coordination: Secondary | ICD-10-CM

## 2015-07-20 DIAGNOSIS — R262 Difficulty in walking, not elsewhere classified: Secondary | ICD-10-CM | POA: Diagnosis not present

## 2015-07-20 DIAGNOSIS — M6281 Muscle weakness (generalized): Secondary | ICD-10-CM

## 2015-07-20 DIAGNOSIS — R2681 Unsteadiness on feet: Secondary | ICD-10-CM

## 2015-07-20 NOTE — Therapy (Signed)
[facility] [address] [place], [place], [address] Phone: [phone]   Fax:  [phone]  Physical Therapy Treatment (Re-Assessment)  Patient Details  Name: [name] [name] MRN: [id number] Date of Birth: 05-18-1950 Referring Provider: [name]   Encounter Date: 07/20/2015      PT End of Session - 07/20/15 1654    Visit Number 5   Number of Visits 12   Date for PT Re-Evaluation 08/15/15   Authorization Type BC[employer] PPO Blue    Authorization Time Period 06/28/15 to 08/09/15   PT Start Time 1600   PT Stop Time 1650   PT Time Calculation (min) 50 min   Activity Tolerance Patient tolerated treatment well   Behavior During Therapy [facility] for tasks assessed/performed      Past Medical History  Diagnosis Date  . Bursitis   . Cellulitis     arm  . Headache   . Insomnia   . Hyperlipidemia   . Onychomycosis   . Osteoarthritis   . OA (osteoarthritis)   . Tenosynovitis   . RLS (restless legs syndrome) 03/23/2015  . Parkinson disease ([place]) 03/23/2015    Past Surgical History  Procedure Laterality Date  . Tubal ligation      There were no vitals filed for this visit.      Subjective Assessment - 07/20/15 1601    Subjective Patient arrives today reporting she is fatiuged from yesterday's session. SHe feels like she is improving, and has not had any stumbling or off balance episodes. She still has some trouble with her left leg as she has to lift it to get into car, not so much out of car. Doing very well otherwise.    Pertinent History hx meningitis, hx of hepatitis, RLS, Parkinsons    Patient Stated Goals improve balance   Currently in Pain? No/denies            [facility] PT Assessment - 07/20/15 0001    Strength   Right Hip Flexion 3+/5   Right Hip Extension 3/5   Right Hip ABduction 3-/5   Left Hip Flexion 3+/5   Left Hip Extension 3-/5   Left Hip ABduction 3-/5   Right Knee Flexion 4/5   Right Knee Extension 4/5    Left Knee Flexion 4/5   Left Knee Extension 4-/5   Right Ankle Dorsiflexion 4+/5   Left Ankle Dorsiflexion 4-/5   6 minute walk test results    Aerobic Endurance Distance Walked 1431   Endurance additional comments 6MWT    Berg Balance Test   Sit to Stand Able to stand without using hands and stabilize independently   Standing Unsupported Able to stand safely 2 minutes   Sitting with Back Unsupported but Feet Supported on Floor or Stool Able to sit safely and securely 2 minutes   Stand to Sit Sits safely with minimal use of hands   Transfers Able to transfer safely, minor use of hands   Standing Unsupported with Eyes Closed Able to stand 10 seconds safely   Standing Ubsupported with Feet Together Able to place feet together independently and stand 1 minute safely   From Standing, Reach Forward with Outstretched Arm Can reach confidently >25 cm (10")   From Standing Position, Pick up Object from Floor Able to pick up shoe safely and easily   From Standing Position, Turn to Look Behind Over each Shoulder Looks behind from both sides and weight shifts well   Turn 360 Degrees  Able to turn 360 degrees safely in 4 seconds or less   Standing Unsupported, Alternately Place Feet on Step/Stool Able to stand independently and safely and complete 8 steps in 20 seconds   Standing Unsupported, One Foot in [place] to place foot tandem independently and hold 30 seconds   Standing on One Leg Able to lift leg independently and hold equal to or more than 3 seconds   Total Score 54   Berg comment: 96%                     [facility] Adult PT Treatment/Exercise - 07/20/15 0001    Knee/Hip Exercises: Supine   Bridges Limitations 2x10   Straight Leg Raises Both;2 sets;10 reps   Other Supine Knee/Hip Exercises supine hip abd with red TB 2x10   mod  assist L LE,cues for form    Knee/Hip Exercises: Prone   Hip Extension Both;2 sets;10 reps               PT Education - 07/20/15 1654     Education provided Yes   Education Details progress with skilled PT services, POC, PWR group class and [employer] in [place], importance of regular high intensity exercise in managing parkinsons, added standing and quadruped PWR to [employer]) Educated Patient   Methods Explanation   Comprehension Verbalized understanding          PT Short Term Goals - 07/20/15 1622    PT SHORT TERM GOAL #1   Title Patient to be able to perform backwards and forwards gait with good foot clearance, minimal shuffling, upright posture, and minimal unsteadiness in order to demonstrate improvement in overall mobility    Baseline 5/25- improving but continutes to have some difficulty with L LE backwards clearnance    Time 3   Period Weeks   Status Partially Met   PT SHORT TERM GOAL #2   Title Patient to score at least a 50 on the BERG in order to demosntrate reduced fall risk    Baseline 5/25- 54   Time 3   Period Weeks   Status Achieved   PT SHORT TERM GOAL #3   Title Patient to be independent in techniques to manage freezing episodes, such as backward stepping, in order to improve overall ease of mobility and to increase self-efficacy    Baseline 5/25-introduced today    Time 3   Period Weeks   Status On-going   PT SHORT TERM GOAL #4   Title Patient to report no falls or close calls within the past 3 weeks in order to demonstrate general improvement in balance and safety    Baseline 5/25- doing very well    Period Weeks   Status Achieved   PT SHORT TERM GOAL #5   Title Patient to be independent in performing appropriate HEP, to be updated PRN    Baseline 5/25- does not do it every day, only does it 5/7 days however does not get a chance to get on treadmill    Time 3   Period Weeks   Status Achieved           PT Long Term Goals - 07/20/15 1625    PT LONG TERM GOAL #1   Title Patient to demonstrate strength 4+/5 in order to improve overall mechanics and reduce fall risk    Time 6    Period Weeks   Status On-going   PT LONG TERM GOAL #2  Title Patient to score at least a 53 on the BERG in order to demonstrate improved safety and reduced fall risk    Baseline 5/25- 54   Time 6   Period Weeks   Status Achieved   PT LONG TERM GOAL #3   Title Patient to be performing at least 30-45 minutes of moderate-high intensity aerobic activity, at least 4 times per week, in order to improve overall health and assist in mitigating parkinsonian symtpmos    Baseline 5/25- has not been doing outside of PT    Time 6   Period Weeks   Status Achieved   PT LONG TERM GOAL #4   Title Patient to be educated about and participatory in local and regional Parkinsons support and activity groups in order to improve overall mood and self-efficacy in managing condition    Baseline 5/25- PWR and [employer] boxing in [place]    Time 6   Period Weeks   Status On-going   PT LONG TERM GOAL #5   Title Patient to be able to perform dual tasking activities with minimal unsteadiness in order to assist in function in community, home, and at work    Baseline 5/25- improving but continues to need some work    Time 6   Period Weeks   Status On-going               Plan - 07/20/15 1633    Clinical Impression Statement Re-assessment performed today. Patient is making excellent progress with skilled PT services and has really made the most progress in the area of dynamic balance; her walking, dual tasking ability, dynamic problem solving, and functional activity tolerance are also improving as well. However, patient continues to demonstrate signficant strength deficits as well as soreness in her L hip espeically which could be related to strength deficits. Patient has not yet started participation in any support groups or regional level Parkinsonian focused group activities such as Ecologist or PWR group class at this point, nor has she initiated steps towards becoming independent in HIIT program.  AT this point recommend ongiong skilled PT services in order to address remaining functionial  impairments, assist in reaching optimal level of function, and prepare patient for independent exercise/activity program  as well as for full participation in community Parkinsons resources.    Rehab Potential Excellent   PT Frequency 2x / week   PT Duration 3 weeks   PT Treatment/Interventions ADLs/Self Care Home Management;Biofeedback;Gait training;Stair training;Functional mobility training;Therapeutic activities;Therapeutic exercise;Balance training;Neuromuscular re-education;Patient/family education;Manual techniques;Energy conservation;Taping   PT Next Visit Plan Increase focus on functional strengthening; continue advanced PWR based activities. RIver stones. Progress to skipping/hopping/HIIT based activtiies.    Consulted and Agree with Plan of Care Patient      Patient will benefit from skilled therapeutic intervention in order to improve the following deficits and impairments:  Abnormal gait, Decreased strength, Decreased balance, Decreased mobility, Difficulty walking, Decreased coordination, Postural dysfunction, Decreased activity tolerance  Visit Diagnosis: Difficulty in walking, not elsewhere classified  Unsteadiness on feet  Muscle weakness (generalized)  Unspecified lack of coordination     Problem List Patient Active Problem List   Diagnosis Date Noted  . Upper airway cough syndrome 05/03/2015  . RLS (restless legs syndrome) 03/23/2015  . Parkinson disease ([place]) 03/23/2015  . Fever 08/29/2011  . Headache(784.0) 08/29/2011  . Knee pain, right 08/29/2011  . Back pain 08/29/2011  . Bacteremia due to Gram-positive bacteria 08/29/2011  . Hepatitis 08/29/2011  . Periorbital  edema 08/29/2011  . Facial rash 08/29/2011  . Blurred vision, right eye 08/29/2011  . Tremor 08/29/2011  . Meningitis 08/29/2011  . CHEST PAIN UNSPECIFIED 03/04/2007  . PNEUMONIA, LEFT LOWER LOBE  03/03/2007  . COUGH, CHRONIC 03/03/2007    Deniece Ree PT, DPT [facility] [address] [place], [place], [address] Phone: [phone]   Fax:  [phone]  Name: [name] [name] MRN: [id number] Date of Birth: 1950-03-04

## 2015-07-26 ENCOUNTER — Ambulatory Visit (HOSPITAL_COMMUNITY): Payer: BC Managed Care – PPO

## 2015-07-26 DIAGNOSIS — M6281 Muscle weakness (generalized): Secondary | ICD-10-CM

## 2015-07-26 DIAGNOSIS — R279 Unspecified lack of coordination: Secondary | ICD-10-CM

## 2015-07-26 DIAGNOSIS — R2681 Unsteadiness on feet: Secondary | ICD-10-CM

## 2015-07-26 DIAGNOSIS — R262 Difficulty in walking, not elsewhere classified: Secondary | ICD-10-CM

## 2015-07-26 NOTE — Therapy (Signed)
[facility] [address] [place], [place], [address] Phone: [phone]   Fax:  [phone]  Physical Therapy Treatment  Patient Details  Name: [name] [name] MRN: [id number] Date of Birth: 1950/04/29 Referring Provider: [name]   Encounter Date: 07/26/2015      PT End of Session - 07/26/15 1659    Visit Number 6   Number of Visits 12   Date for PT Re-Evaluation 08/15/15   Authorization Type [employer] PPO Blue    Authorization Time Period 06/28/15 to 08/09/15   PT Start Time 1657  Pt late for apt today   PT Stop Time 1735   PT Time Calculation (min) 38 min   Equipment Utilized During Treatment Gait belt   Activity Tolerance Patient tolerated treatment well   Behavior During Therapy [facility] for tasks assessed/performed      Past Medical History  Diagnosis Date  . Bursitis   . Cellulitis     arm  . Headache   . Insomnia   . Hyperlipidemia   . Onychomycosis   . Osteoarthritis   . OA (osteoarthritis)   . Tenosynovitis   . RLS (restless legs syndrome) 03/23/2015  . Parkinson disease ([place]) 03/23/2015    Past Surgical History  Procedure Laterality Date  . Tubal ligation      There were no vitals filed for this visit.      Subjective Assessment - 07/26/15 1657    Subjective Pt stated she is exhausted today, had EOG testing with work and really tired.  No reports of pain today.   Reports compliance wiht HEP daily without question.  Pt stated main difficulty with walking, feels her Lt LE is really heavy.   Pertinent History hx meningitis, hx of hepatitis, RLS, Parkinsons    Patient Stated Goals improve balance   Currently in Pain? No/denies              [facility] Adult PT Treatment/Exercise - 07/26/15 0001    Ambulation/Gait   Gait Comments 452 feet with hiking poles, cueing for sequence and to increase hip flexion with opposite UE   Knee/Hip Exercises: Aerobic   Tread Mill x6 min 2.5 ->3.4 mph incline 8%  supervised  for safety   Knee/Hip Exercises: Supine   Bridges Limitations 15   Straight Leg Raises Both;2 sets;10 reps   Other Supine Knee/Hip Exercises supine hip abd with red TB 2x10   Knee/Hip Exercises: Sidelying   Hip ABduction AAROM;Both;10 reps   Hip ABduction Limitations cueing for form and AAROM Lt LE    Knee/Hip Exercises: Prone   Hip Extension Both;2 sets;10 reps              PT Short Term Goals - 07/20/15 1622    PT SHORT TERM GOAL #1   Title Patient to be able to perform backwards and forwards gait with good foot clearance, minimal shuffling, upright posture, and minimal unsteadiness in order to demonstrate improvement in overall mobility    Baseline 5/25- improving but continutes to have some difficulty with L LE backwards clearnance    Time 3   Period Weeks   Status Partially Met   PT SHORT TERM GOAL #2   Title Patient to score at least a 50 on the BERG in order to demosntrate reduced fall risk    Baseline 5/25- 54   Time 3   Period Weeks   Status Achieved   PT SHORT TERM GOAL #3   Title Patient  to be independent in techniques to manage freezing episodes, such as backward stepping, in order to improve overall ease of mobility and to increase self-efficacy    Baseline 5/25-introduced today    Time 3   Period Weeks   Status On-going   PT SHORT TERM GOAL #4   Title Patient to report no falls or close calls within the past 3 weeks in order to demonstrate general improvement in balance and safety    Baseline 5/25- doing very well    Period Weeks   Status Achieved   PT SHORT TERM GOAL #5   Title Patient to be independent in performing appropriate HEP, to be updated PRN    Baseline 5/25- does not do it every day, only does it 5/7 days however does not get a chance to get on treadmill    Time 3   Period Weeks   Status Achieved           PT Long Term Goals - 07/20/15 1625    PT LONG TERM GOAL #1   Title Patient to demonstrate strength 4+/5 in order to improve  overall mechanics and reduce fall risk    Time 6   Period Weeks   Status On-going   PT LONG TERM GOAL #2   Title Patient to score at least a 53 on the BERG in order to demonstrate improved safety and reduced fall risk    Baseline 5/25- 54   Time 6   Period Weeks   Status Achieved   PT LONG TERM GOAL #3   Title Patient to be performing at least 30-45 minutes of moderate-high intensity aerobic activity, at least 4 times per week, in order to improve overall health and assist in mitigating parkinsonian symtpmos    Baseline 5/25- has not been doing outside of PT    Time 6   Period Weeks   Status Achieved   PT LONG TERM GOAL #4   Title Patient to be educated about and participatory in local and regional Parkinsons support and activity groups in order to improve overall mood and self-efficacy in managing condition    Baseline 5/25- PWR and [employer] boxing in [place]    Time 6   Period Weeks   Status On-going   PT LONG TERM GOAL #5   Title Patient to be able to perform dual tasking activities with minimal unsteadiness in order to assist in function in community, home, and at work    Baseline 5/25- improving but continues to need some work    Time 6   Period Weeks   Status On-going               Plan - 07/26/15 1707    Clinical Impression Statement Pt late for apt today, unable to complete full POC for today.  Session focus on functional strenghtening primarly focus on hip musculature.  Pt able to demonstrated appropriate form with all exercises following initial cueing for form and technqiue with therex.  Noted increased tremers with Lt UE with increased physical activity, reduced with rest breaks.  No reports of increased pain through session.  Therapist faciilitation for safety with gait training today and cueing for proper sequence with hiking sticks.     Rehab Potential Excellent   PT Frequency 2x / week   PT Duration 3 weeks   PT Treatment/Interventions ADLs/Self Care Home  Management;Biofeedback;Gait training;Stair training;Functional mobility training;Therapeutic activities;Therapeutic exercise;Balance training;Neuromuscular re-education;Patient/family education;Manual techniques;Energy conservation;Taping   PT Next Visit Plan Increase  focus on functional strengthening; continue advanced PWR based activities. RIver stones. Progress to skipping/hopping/HIIT based activtiies.       Patient will benefit from skilled therapeutic intervention in order to improve the following deficits and impairments:  Abnormal gait, Decreased strength, Decreased balance, Decreased mobility, Difficulty walking, Decreased coordination, Postural dysfunction, Decreased activity tolerance  Visit Diagnosis: Difficulty in walking, not elsewhere classified  Unsteadiness on feet  Muscle weakness (generalized)  Unspecified lack of coordination     Problem List Patient Active Problem List   Diagnosis Date Noted  . Upper airway cough syndrome 05/03/2015  . RLS (restless legs syndrome) 03/23/2015  . Parkinson disease ([place]) 03/23/2015  . Fever 08/29/2011  . Headache(784.0) 08/29/2011  . Knee pain, right 08/29/2011  . Back pain 08/29/2011  . Bacteremia due to Gram-positive bacteria 08/29/2011  . Hepatitis 08/29/2011  . Periorbital edema 08/29/2011  . Facial rash 08/29/2011  . Blurred vision, right eye 08/29/2011  . Tremor 08/29/2011  . Meningitis 08/29/2011  . CHEST PAIN UNSPECIFIED 03/04/2007  . PNEUMONIA, LEFT LOWER LOBE 03/03/2007  . COUGH, CHRONIC 03/03/2007   Ihor [place], [place]; [place]  Aldona Lento 07/26/2015, 6:48 PM  [facility] [address] [place], [place], [address] Phone: [phone]   Fax:  [phone]  Name: [name] [name] MRN: [id number] Date of Birth: April 19, 1950

## 2015-07-27 ENCOUNTER — Ambulatory Visit (HOSPITAL_COMMUNITY): Payer: BC Managed Care – PPO | Attending: Neurology | Admitting: Physical Therapy

## 2015-07-27 DIAGNOSIS — R278 Other lack of coordination: Secondary | ICD-10-CM | POA: Insufficient documentation

## 2015-07-27 DIAGNOSIS — R279 Unspecified lack of coordination: Secondary | ICD-10-CM

## 2015-07-27 DIAGNOSIS — R262 Difficulty in walking, not elsewhere classified: Secondary | ICD-10-CM | POA: Diagnosis not present

## 2015-07-27 DIAGNOSIS — R2681 Unsteadiness on feet: Secondary | ICD-10-CM

## 2015-07-27 DIAGNOSIS — M6281 Muscle weakness (generalized): Secondary | ICD-10-CM | POA: Diagnosis present

## 2015-07-27 DIAGNOSIS — R29898 Other symptoms and signs involving the musculoskeletal system: Secondary | ICD-10-CM | POA: Diagnosis present

## 2015-07-27 NOTE — Therapy (Signed)
[facility] [address] [place], [place], [address] Phone: [phone]   Fax:  [phone]  Physical Therapy Treatment  Patient Details  Name: [name] [name] MRN: [id number] Date of Birth: 08/28/1950 Referring Provider: [name]   Encounter Date: 07/27/2015      PT End of Session - 07/27/15 1758    Visit Number 7   Number of Visits 12   Date for PT Re-Evaluation 08/15/15   Authorization Type BCBS PPO Blue    Authorization Time Period 06/28/15 to 08/09/15   PT Start Time 34  pateint arrived a few minutes late, did not bill for time on treadmill    PT Stop Time 1643   PT Time Calculation (min) 27 min   Activity Tolerance Patient tolerated treatment well   Behavior During Therapy [facility] for tasks assessed/performed      Past Medical History  Diagnosis Date  . Bursitis   . Cellulitis     arm  . Headache   . Insomnia   . Hyperlipidemia   . Onychomycosis   . Osteoarthritis   . OA (osteoarthritis)   . Tenosynovitis   . RLS (restless legs syndrome) 03/23/2015  . Parkinson disease ([place]) 03/23/2015    Past Surgical History  Procedure Laterality Date  . Tubal ligation      There were no vitals filed for this visit.      Subjective Assessment - 07/27/15 1611    Subjective Patient arrives reporting that she is having qutie a bit of muscle soreness but feeling good   Pertinent History hx meningitis, hx of hepatitis, RLS, Parkinsons    Patient Stated Goals improve balance   Currently in Pain? No/denies                         [facility] Adult PT Treatment/Exercise - 07/27/15 0001    Knee/Hip Exercises: Aerobic   Tread Mill x6 minutes on TM at 3.1MPH, 8% incline   not included in billing    Knee/Hip Exercises: Supine   Bridges Limitations 2x15   Straight Leg Raises Both;10 reps;2 sets   Straight Leg Raises Limitations 2#    Other Supine Knee/Hip Exercises supine hip abd with red TB 1x10           PWR  [facility]) - 07/27/15 1756    PWR! Up 1x10 quadruped    PWR! Rock 1x10 quadruped    PWR! Twist 1x10 quadruped    PWR! Step 1x10 quadruped    Comments cues for form              PT Education - 07/27/15 1758    Education provided Yes   Education Details updated HEP    Person(s) Educated Patient   Methods Explanation;Handout;Demonstration   Comprehension Verbalized understanding;Returned demonstration          PT Short Term Goals - 07/20/15 1622    PT SHORT TERM GOAL #1   Title Patient to be able to perform backwards and forwards gait with good foot clearance, minimal shuffling, upright posture, and minimal unsteadiness in order to demonstrate improvement in overall mobility    Baseline 5/25- improving but continutes to have some difficulty with L LE backwards clearnance    Time 3   Period Weeks   Status Partially Met   PT SHORT TERM GOAL #2   Title Patient to score at least a 50 on the BERG in order to demosntrate reduced fall  risk    Baseline 5/25- 54   Time 3   Period Weeks   Status Achieved   PT SHORT TERM GOAL #3   Title Patient to be independent in techniques to manage freezing episodes, such as backward stepping, in order to improve overall ease of mobility and to increase self-efficacy    Baseline 5/25-introduced today    Time 3   Period Weeks   Status On-going   PT SHORT TERM GOAL #4   Title Patient to report no falls or close calls within the past 3 weeks in order to demonstrate general improvement in balance and safety    Baseline 5/25- doing very well    Period Weeks   Status Achieved   PT SHORT TERM GOAL #5   Title Patient to be independent in performing appropriate HEP, to be updated PRN    Baseline 5/25- does not do it every day, only does it 5/7 days however does not get a chance to get on treadmill    Time 3   Period Weeks   Status Achieved           PT Long Term Goals - 07/20/15 1625    PT LONG TERM GOAL #1   Title Patient to demonstrate  strength 4+/5 in order to improve overall mechanics and reduce fall risk    Time 6   Period Weeks   Status On-going   PT LONG TERM GOAL #2   Title Patient to score at least a 53 on the BERG in order to demonstrate improved safety and reduced fall risk    Baseline 5/25- 54   Time 6   Period Weeks   Status Achieved   PT LONG TERM GOAL #3   Title Patient to be performing at least 30-45 minutes of moderate-high intensity aerobic activity, at least 4 times per week, in order to improve overall health and assist in mitigating parkinsonian symtpmos    Baseline 5/25- has not been doing outside of PT    Time 6   Period Weeks   Status Achieved   PT LONG TERM GOAL #4   Title Patient to be educated about and participatory in local and regional Parkinsons support and activity groups in order to improve overall mood and self-efficacy in managing condition    Baseline 5/25- PWR and [employer] boxing in [place]    Time 6   Period Weeks   Status On-going   PT LONG TERM GOAL #5   Title Patient to be able to perform dual tasking activities with minimal unsteadiness in order to assist in function in community, home, and at work    Baseline 5/25- improving but continues to need some work    Time 6   Period Weeks   Status On-going               Plan - 07/27/15 1800    Clinical Impression Statement Patient late for apt today; began session on treadmill which was not included in billing today due ot patient getting independent with safety for this. Otherwise focused on functional strengthening as well as PWR training today, with cues for form and appropriate  technique today. Continue to note muscle fatigue with proximal strengthening especially hip abductors, and added proximal strength to HEP so taht strength can be progressed here in clinic.    Rehab Potential Excellent   PT Frequency 2x / week   PT Duration 3 weeks   PT Treatment/Interventions ADLs/Self Care Home Management;Biofeedback;Gait  training;Stair training;Functional mobility training;Therapeutic activities;Therapeutic exercise;Balance training;Neuromuscular re-education;Patient/family education;Manual techniques;Energy conservation;Taping   PT Next Visit Plan Increase focus on CKC functional strengthening; continue advanced PWR based activities. RIver stones. Progress to skipping/hopping/HIIT based activtiies.    PT Home Exercise Plan added treadmill work    Consulted and Agree with Plan of Care Patient      Patient will benefit from skilled therapeutic intervention in order to improve the following deficits and impairments:  Abnormal gait, Decreased strength, Decreased balance, Decreased mobility, Difficulty walking, Decreased coordination, Postural dysfunction, Decreased activity tolerance  Visit Diagnosis: Difficulty in walking, not elsewhere classified  Unsteadiness on feet  Muscle weakness (generalized)  Unspecified lack of coordination     Problem List Patient Active Problem List   Diagnosis Date Noted  . Upper airway cough syndrome 05/03/2015  . RLS (restless legs syndrome) 03/23/2015  . Parkinson disease ([place]) 03/23/2015  . Fever 08/29/2011  . Headache(784.0) 08/29/2011  . Knee pain, right 08/29/2011  . Back pain 08/29/2011  . Bacteremia due to Gram-positive bacteria 08/29/2011  . Hepatitis 08/29/2011  . Periorbital edema 08/29/2011  . Facial rash 08/29/2011  . Blurred vision, right eye 08/29/2011  . Tremor 08/29/2011  . Meningitis 08/29/2011  . CHEST PAIN UNSPECIFIED 03/04/2007  . PNEUMONIA, LEFT LOWER LOBE 03/03/2007  . COUGH, CHRONIC 03/03/2007    Deniece Ree PT, DPT [facility] [address] [place], [place], [address] Phone: [phone]   Fax:  [phone]  Name: NORETA KUE MRN: [id number] Date of Birth: 11/08/1950

## 2015-07-27 NOTE — Patient Instructions (Signed)
   ELASTIC BAND - SUPINE HIP ABDUCTION  While lying on your back and the band around your ankles, slowly bring your leg out to the side. Keep  your knee straight the entire time.  Repeat 10 times each side, twice a day.      BRIDGING  While lying on your back, tighten your lower abdominals, squeeze your buttocks and then raise your buttocks off the floor/bed as creating a "Bridge" with your body. Hold and then lower yourself and repeat.  Repeat 15 times, twice a day.

## 2015-07-31 ENCOUNTER — Ambulatory Visit (HOSPITAL_COMMUNITY): Payer: BC Managed Care – PPO | Admitting: Physical Therapy

## 2015-07-31 DIAGNOSIS — R279 Unspecified lack of coordination: Secondary | ICD-10-CM

## 2015-07-31 DIAGNOSIS — R2681 Unsteadiness on feet: Secondary | ICD-10-CM

## 2015-07-31 DIAGNOSIS — R262 Difficulty in walking, not elsewhere classified: Secondary | ICD-10-CM

## 2015-07-31 DIAGNOSIS — M6281 Muscle weakness (generalized): Secondary | ICD-10-CM

## 2015-07-31 NOTE — Therapy (Signed)
[facility] [address] [place], [place], [address] Phone: [phone]   Fax:  [phone]  Physical Therapy Treatment  Patient Details  Name: [name] [name] MRN: [id number] Date of Birth: 1950/11/23 Referring Provider: [name]   Encounter Date: 07/31/2015      PT End of Session - 07/31/15 1729    Visit Number 8   Number of Visits 12   Date for PT Re-Evaluation 08/15/15   Authorization Type BCBS PPO Blue    Authorization Time Period 06/28/15 to 08/09/15   PT Start Time 1655  started on treadmill, not included in billing    PT Stop Time 1728   PT Time Calculation (min) 33 min   Activity Tolerance Patient tolerated treatment well   Behavior During Therapy [facility] for tasks assessed/performed      Past Medical History  Diagnosis Date  . Bursitis   . Cellulitis     arm  . Headache   . Insomnia   . Hyperlipidemia   . Onychomycosis   . Osteoarthritis   . OA (osteoarthritis)   . Tenosynovitis   . RLS (restless legs syndrome) 03/23/2015  . Parkinson disease ([place]) 03/23/2015    Past Surgical History  Procedure Laterality Date  . Tubal ligation      There were no vitals filed for this visit.      Subjective Assessment - 07/31/15 1706    Subjective Patient arrives reporting that she is doing well today, no pain    Pertinent History hx meningitis, hx of hepatitis, RLS, Parkinsons    Patient Stated Goals improve balance   Currently in Pain? No/denies                         [facility] Adult PT Treatment/Exercise - 07/31/15 0001    Knee/Hip Exercises: Aerobic   Tread Mill x7 minutes on TM at 2.8-3.2MPH, 8% incline   not included in billing    Knee/Hip Exercises: Standing   Lateral Step Up 2 sets;10 reps   Lateral Step Up Limitations 6 inch box    Forward Step Up 2 sets;10 reps   Forward Step Up Limitations 6 inch box    Other Standing Knee Exercises sit to stand staggered 2x10    Other Standing Knee  Exercises standing marches with 2# 2x276f            PWR ([facility] - 07/31/15 1722    PWR! Up 1x10 standing yellow ball    PWR! Rock 1x10 standing 3# weights    PWR! Twist 1x10 standing green TB    Comments min-mod cues for form   stepping with weights for step form of PWR              PT Education - 07/31/15 1729    Education provided No          PT Short Term Goals - 07/20/15 1622    PT SHORT TERM GOAL #1   Title Patient to be able to perform backwards and forwards gait with good foot clearance, minimal shuffling, upright posture, and minimal unsteadiness in order to demonstrate improvement in overall mobility    Baseline 5/25- improving but continutes to have some difficulty with L LE backwards clearnance    Time 3   Period Weeks   Status Partially Met   PT SHORT TERM GOAL #2   Title Patient to score at least a 50 on the BERG in order  to demosntrate reduced fall risk    Baseline 5/25- 54   Time 3   Period Weeks   Status Achieved   PT SHORT TERM GOAL #3   Title Patient to be independent in techniques to manage freezing episodes, such as backward stepping, in order to improve overall ease of mobility and to increase self-efficacy    Baseline 5/25-introduced today    Time 3   Period Weeks   Status On-going   PT SHORT TERM GOAL #4   Title Patient to report no falls or close calls within the past 3 weeks in order to demonstrate general improvement in balance and safety    Baseline 5/25- doing very well    Period Weeks   Status Achieved   PT SHORT TERM GOAL #5   Title Patient to be independent in performing appropriate HEP, to be updated PRN    Baseline 5/25- does not do it every day, only does it 5/7 days however does not get a chance to get on treadmill    Time 3   Period Weeks   Status Achieved           PT Long Term Goals - 07/20/15 1625    PT LONG TERM GOAL #1   Title Patient to demonstrate strength 4+/5 in order to improve overall mechanics and  reduce fall risk    Time 6   Period Weeks   Status On-going   PT LONG TERM GOAL #2   Title Patient to score at least a 53 on the BERG in order to demonstrate improved safety and reduced fall risk    Baseline 5/25- 54   Time 6   Period Weeks   Status Achieved   PT LONG TERM GOAL #3   Title Patient to be performing at least 30-45 minutes of moderate-high intensity aerobic activity, at least 4 times per week, in order to improve overall health and assist in mitigating parkinsonian symtpmos    Baseline 5/25- has not been doing outside of PT    Time 6   Period Weeks   Status Achieved   PT LONG TERM GOAL #4   Title Patient to be educated about and participatory in local and regional Parkinsons support and activity groups in order to improve overall mood and self-efficacy in managing condition    Baseline 5/25- PWR and [employer] boxing in [place]    Time 6   Period Weeks   Status On-going   PT LONG TERM GOAL #5   Title Patient to be able to perform dual tasking activities with minimal unsteadiness in order to assist in function in community, home, and at work    Baseline 5/25- improving but continues to need some work    Time 6   Period Weeks   Status On-going               Plan - 07/31/15 1729    Clinical Impression Statement Focused primarily on strength today, hwoever did begin session on tradmill which was not included in billing. Introduced functional standing activities focused primarily on functional strengthening, also incorporated functional stnading PWR training that was modified to incorporate more strength with weighted balls, handweights, and bands today. Patient fatigued and did require multiple rest breaks through sesion however does report feeling more functionally strong during tasks throughout her day.    Rehab Potential Excellent   PT Frequency 2x / week   PT Duration 3 weeks   PT Treatment/Interventions ADLs/Self Care Home Management;Biofeedback;Gait  training;Stair training;Functional mobility training;Therapeutic activities;Therapeutic exercise;Balance training;Neuromuscular re-education;Patient/family education;Manual techniques;Energy conservation;Taping   PT Next Visit Plan Increase focus on CKC functional strengthening; continue advanced PWR based activities. RIver stones. Progress to skipping/hopping/HIIT based activtiies.    Consulted and Agree with Plan of Care Patient      Patient will benefit from skilled therapeutic intervention in order to improve the following deficits and impairments:  Abnormal gait, Decreased strength, Decreased balance, Decreased mobility, Difficulty walking, Decreased coordination, Postural dysfunction, Decreased activity tolerance  Visit Diagnosis: Difficulty in walking, not elsewhere classified  Unsteadiness on feet  Muscle weakness (generalized)  Unspecified lack of coordination     Problem List Patient Active Problem List   Diagnosis Date Noted  . Upper airway cough syndrome 05/03/2015  . RLS (restless legs syndrome) 03/23/2015  . Parkinson disease ([place]) 03/23/2015  . Fever 08/29/2011  . Headache(784.0) 08/29/2011  . Knee pain, right 08/29/2011  . Back pain 08/29/2011  . Bacteremia due to Gram-positive bacteria 08/29/2011  . Hepatitis 08/29/2011  . Periorbital edema 08/29/2011  . Facial rash 08/29/2011  . Blurred vision, right eye 08/29/2011  . Tremor 08/29/2011  . Meningitis 08/29/2011  . CHEST PAIN UNSPECIFIED 03/04/2007  . PNEUMONIA, LEFT LOWER LOBE 03/03/2007  . COUGH, CHRONIC 03/03/2007    Deniece Ree PT, DPT [facility] [address] [place], [place], [address] Phone: [phone]   Fax:  [phone]  Name: [name] [name] MRN: [id number] Date of Birth: 1950/09/28

## 2015-08-02 ENCOUNTER — Telehealth (HOSPITAL_COMMUNITY): Payer: Self-pay | Admitting: Physical Therapy

## 2015-08-02 ENCOUNTER — Ambulatory Visit (HOSPITAL_COMMUNITY): Payer: BC Managed Care – PPO | Admitting: Physical Therapy

## 2015-08-02 DIAGNOSIS — R262 Difficulty in walking, not elsewhere classified: Secondary | ICD-10-CM

## 2015-08-02 DIAGNOSIS — M6281 Muscle weakness (generalized): Secondary | ICD-10-CM

## 2015-08-02 DIAGNOSIS — R2681 Unsteadiness on feet: Secondary | ICD-10-CM

## 2015-08-02 DIAGNOSIS — R279 Unspecified lack of coordination: Secondary | ICD-10-CM

## 2015-08-02 NOTE — Therapy (Signed)
[facility] [address] [place], [place], [address] Phone: [phone]   Fax:  [phone]  Physical Therapy Treatment  Patient Details  Name: [name] [name] MRN: [id number] Date of Birth: 01-29-1951 Referring Provider: [name]   Encounter Date: 08/02/2015      PT End of Session - 08/02/15 1609    Visit Number 9   Number of Visits 12   Date for PT Re-Evaluation 08/15/15   Authorization Type [employer]BS PPO Blue    Authorization Time Period 06/28/15 to 08/09/15   PT Start Time 1529  patient arrived a few minutes late; started with unbilled time on treadmill    PT Stop Time 1600   PT Time Calculation (min) 31 min   Activity Tolerance Patient tolerated treatment well   Behavior During Therapy [facility] for tasks assessed/performed      Past Medical History  Diagnosis Date  . Bursitis   . Cellulitis     arm  . Headache   . Insomnia   . Hyperlipidemia   . Onychomycosis   . Osteoarthritis   . OA (osteoarthritis)   . Tenosynovitis   . RLS (restless legs syndrome) 03/23/2015  . Parkinson disease ([place]) 03/23/2015    Past Surgical History  Procedure Laterality Date  . Tubal ligation      There were no vitals filed for this visit.      Subjective Assessment - 08/02/15 1533    Subjective Patient arrives feeling well but reporting that she was very sore after last session but felt good, happy that she was able to come in earlier for session today    Pertinent History hx meningitis, hx of hepatitis, RLS, Parkinsons    Patient Stated Goals improve balance   Currently in Pain? No/denies                         [facility] Adult PT Treatment/Exercise - 08/02/15 0001    Knee/Hip Exercises: Standing   Lateral Step Up 2 sets;10 reps   Lateral Step Up Limitations 6 inch box    Forward Step Up 2 sets;10 reps   Forward Step Up Limitations 6 inch box    Other Standing Knee Exercises side skipping 3x14f with cues for form and  safety   Other Standing Knee Exercises suicide drills: 5 walks, speedwalk 575f 5 high knees (4 rounds total)           PWR ([facility]) - 08/02/15 1607    PWR! Up 1x10 standing with boom whackers, min cues    PWR! Rock 1x30 boom whackers 3 person circle to challenge dual tasking and sequencing   standing   PWR! Twist 1x30 boom whackers 3 person circle to challenge sequencing and dual tasking   standing    Comments high knees [id number] dynamic PWR stepping   min cues              PT Education - 08/02/15 1609    Education provided Yes   Education Details may have muscle soreness after today's session; stay well hydrated and make sure she is moving regularly to address    Person(s) Educated Patient   Methods Explanation   Comprehension Verbalized understanding          PT Short Term Goals - 07/20/15 1622    PT SHORT TERM GOAL #1   Title Patient to be able to perform backwards and forwards gait with good foot clearance, minimal  shuffling, upright posture, and minimal unsteadiness in order to demonstrate improvement in overall mobility    Baseline 5/25- improving but continutes to have some difficulty with L LE backwards clearnance    Time 3   Period Weeks   Status Partially Met   PT SHORT TERM GOAL #2   Title Patient to score at least a 50 on the BERG in order to demosntrate reduced fall risk    Baseline 5/25- 54   Time 3   Period Weeks   Status Achieved   PT SHORT TERM GOAL #3   Title Patient to be independent in techniques to manage freezing episodes, such as backward stepping, in order to improve overall ease of mobility and to increase self-efficacy    Baseline 5/25-introduced today    Time 3   Period Weeks   Status On-going   PT SHORT TERM GOAL #4   Title Patient to report no falls or close calls within the past 3 weeks in order to demonstrate general improvement in balance and safety    Baseline 5/25- doing very well    Period Weeks   Status Achieved   PT  SHORT TERM GOAL #5   Title Patient to be independent in performing appropriate HEP, to be updated PRN    Baseline 5/25- does not do it every day, only does it 5/7 days however does not get a chance to get on treadmill    Time 3   Period Weeks   Status Achieved           PT Long Term Goals - 07/20/15 1625    PT LONG TERM GOAL #1   Title Patient to demonstrate strength 4+/5 in order to improve overall mechanics and reduce fall risk    Time 6   Period Weeks   Status On-going   PT LONG TERM GOAL #2   Title Patient to score at least a 53 on the BERG in order to demonstrate improved safety and reduced fall risk    Baseline 5/25- 54   Time 6   Period Weeks   Status Achieved   PT LONG TERM GOAL #3   Title Patient to be performing at least 30-45 minutes of moderate-high intensity aerobic activity, at least 4 times per week, in order to improve overall health and assist in mitigating parkinsonian symtpmos    Baseline 5/25- has not been doing outside of PT    Time 6   Period Weeks   Status Achieved   PT LONG TERM GOAL #4   Title Patient to be educated about and participatory in local and regional Parkinsons support and activity groups in order to improve overall mood and self-efficacy in managing condition    Baseline 5/25- PWR and [employer] boxing in [place]    Time 6   Period Weeks   Status On-going   PT LONG TERM GOAL #5   Title Patient to be able to perform dual tasking activities with minimal unsteadiness in order to assist in function in community, home, and at work    Baseline 5/25- improving but continues to need some work    Time 6   Period Weeks   Status On-going               Plan - 08/02/15 1610    Clinical Impression Statement Began session with high intensity treadmill training, not included in billing. Otherwise continued with dynamic sets of functional strengthening mixed with aerobic activity; introduced dynamic drills such as  side skipping and suicide drills  today to further address cardio and strength. Progressed standign PWR activities to a 3 person circle with boom whackers to further challenge dual tasking and sequencing as well as dynamic balance today with min cues needed throughout.  Ended session with 2 rounds of high knees to challenge PWR stepping in standing. Rest breaks provided through session and patient reported fatigue at end of session. Educated that she may experience muscle soreness today, best way to manage is good hydration and frequent motion throughout the day.    Rehab Potential Excellent   PT Frequency 2x / week   PT Duration 3 weeks   PT Treatment/Interventions ADLs/Self Care Home Management;Biofeedback;Gait training;Stair training;Functional mobility training;Therapeutic activities;Therapeutic exercise;Balance training;Neuromuscular re-education;Patient/family education;Manual techniques;Energy conservation;Taping   PT Next Visit Plan Increase focus on CKC functional strengthening; continue advanced PWR based activities. RIver stones. Progress to skipping/hopping/HIIT based activtiies.    PT Home Exercise Plan added treadmill work    Consulted and Agree with Plan of Care Patient      Patient will benefit from skilled therapeutic intervention in order to improve the following deficits and impairments:  Abnormal gait, Decreased strength, Decreased balance, Decreased mobility, Difficulty walking, Decreased coordination, Postural dysfunction, Decreased activity tolerance  Visit Diagnosis: Difficulty in walking, not elsewhere classified  Unsteadiness on feet  Muscle weakness (generalized)  Unspecified lack of coordination     Problem List Patient Active Problem List   Diagnosis Date Noted  . Upper airway cough syndrome 05/03/2015  . RLS (restless legs syndrome) 03/23/2015  . Parkinson disease ([place]) 03/23/2015  . Fever 08/29/2011  . Headache(784.0) 08/29/2011  . Knee pain, right 08/29/2011  . Back pain 08/29/2011   . Bacteremia due to Gram-positive bacteria 08/29/2011  . Hepatitis 08/29/2011  . Periorbital edema 08/29/2011  . Facial rash 08/29/2011  . Blurred vision, right eye 08/29/2011  . Tremor 08/29/2011  . Meningitis 08/29/2011  . CHEST PAIN UNSPECIFIED 03/04/2007  . PNEUMONIA, LEFT LOWER LOBE 03/03/2007  . COUGH, CHRONIC 03/03/2007    Deniece Ree PT, DPT [facility] [address] [place], [place], [address] Phone: [phone]   Fax:  [phone]  Name: DANIESHA DRIVER MRN: [id number] Date of Birth: [birth year]

## 2015-08-02 NOTE — Telephone Encounter (Signed)
Called patient and asked if she would like to move her appointment up  earlier this afternoon; patient agreeable to coming in at 3:15pm today.  Nedra HaiKristen Unger PT, DPT [phone]

## 2015-08-07 ENCOUNTER — Ambulatory Visit (HOSPITAL_COMMUNITY): Payer: BC Managed Care – PPO | Admitting: Physical Therapy

## 2015-08-07 DIAGNOSIS — M6281 Muscle weakness (generalized): Secondary | ICD-10-CM

## 2015-08-07 DIAGNOSIS — R279 Unspecified lack of coordination: Secondary | ICD-10-CM

## 2015-08-07 DIAGNOSIS — R262 Difficulty in walking, not elsewhere classified: Secondary | ICD-10-CM

## 2015-08-07 DIAGNOSIS — R2681 Unsteadiness on feet: Secondary | ICD-10-CM

## 2015-08-07 NOTE — Therapy (Signed)
[facility] [address] [place], [place], [address] Phone: [phone]   Fax:  [phone]  Physical Therapy Treatment (Re-Assessment)  Patient Details  Name: [name] [name] MRN: [id number] Date of Birth: 17-Sep-1950 Referring Provider: [name]   Encounter Date: 08/07/2015      PT End of Session - 08/07/15 1731    Visit Number 10   Number of Visits 11   Date for PT Re-Evaluation 09/04/15   Authorization Type BC[employer] PPO Blue    Authorization Time Period 06/28/15 to 08/09/15   PT Start Time 1652  patient arrived a few minutes late    PT Stop Time 1728   PT Time Calculation (min) 36 min   Activity Tolerance Patient tolerated treatment well   Behavior During Therapy [facility] for tasks assessed/performed      Past Medical History  Diagnosis Date  . Bursitis   . Cellulitis     arm  . Headache   . Insomnia   . Hyperlipidemia   . Onychomycosis   . Osteoarthritis   . OA (osteoarthritis)   . Tenosynovitis   . RLS (restless legs syndrome) 03/23/2015  . Parkinson disease ([place]) 03/23/2015    Past Surgical History  Procedure Laterality Date  . Tubal ligation      There were no vitals filed for this visit.      Subjective Assessment - 08/07/15 1654    Subjective Patient reports that she has been feeling good; it has gotten easier for her to keep her balance walking, she is trying even harder with lifting her leg during task and she is feeling much stronger. She has had a hard time keeping her balance, however as she still feels herself tumbling over. No falls or close calls recently. She is trembling more when she is exercising.    Pertinent History hx meningitis, hx of hepatitis, RLS, Parkinsons    Patient Stated Goals improve balance   Currently in Pain? No/denies            [facility] PT Assessment - 08/07/15 0001    Assessment   Medical Diagnosis Parkinsons    Referring Provider [name]    Next MD Visit Dr. Selina Cooley eh 26th    Precautions   Precautions Fall   Balance Screen   Has the patient fallen in the past 6 months No   Has the patient had a decrease in activity level because of a fear of falling?  No   Is the patient reluctant to leave their home because of a fear of falling?  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    Leisure walking    Observation/Other Assessments   Observations scour negative, FABER positive L hip   Strength   Right Hip Flexion 4/5   Right Hip Extension 4-/5   Right Hip ABduction 3/5   Left Hip Flexion 4/5   Left Hip Extension 3/5   Left Hip ABduction 3-/5   Right Knee Flexion 4+/5   Right Knee Extension 4+/5   Left Knee Flexion 4/5   Left Knee Extension 4/5   Right Ankle Dorsiflexion 4+/5   Left Ankle Dorsiflexion 4/5   6 minute walk test results    Aerobic Endurance Distance Walked [phone]   Endurance additional comments 6MWT  Berg Balance Test   Sit to Stand Able to stand without using hands and stabilize independently   Standing Unsupported Able to stand safely 2 minutes   Sitting with Back Unsupported but Feet Supported on Floor or Stool Able to sit safely and securely 2 minutes   Stand to Sit Sits safely with minimal use of hands   Transfers Able to transfer safely, minor use of hands   Standing Unsupported with Eyes Closed Able to stand 10 seconds safely   Standing Ubsupported with Feet Together Able to place feet together independently and stand 1 minute safely   From Standing, Reach Forward with Outstretched Arm Can reach confidently >25 cm (10")   From Standing Position, Pick up Object from Floor Able to pick up shoe safely and easily   From Standing Position, Turn to Look Behind Over each Shoulder Looks behind from both sides and weight shifts well    Turn 360 Degrees Able to turn 360 degrees safely in 4 seconds or less   Standing Unsupported, Alternately Place Feet on Step/Stool Able to stand independently and safely and complete 8 steps in 20 seconds   Standing Unsupported, One Foot in Front Able to place foot tandem independently and hold 30 seconds   Standing on One Leg Able to lift leg independently and hold equal to or more than 3 seconds   Total Score 54                     [facility] Adult PT Treatment/Exercise - 08/07/15 0001    Knee/Hip Exercises: Standing   Other Standing Knee Exercises juggling scarves 1x244f forwards, 1x679fbackwards                 PT Education - 08/07/15 1731    Education provided Yes   Education Details progress with skilled PT services, DC tomorrow    Person(s) Educated Patient   Methods Explanation   Comprehension Verbalized understanding          PT Short Term Goals - 08/07/15 1715    PT SHORT TERM GOAL #1   Title Patient to be able to perform backwards and forwards gait with good foot clearance, minimal shuffling, upright posture, and minimal unsteadiness in order to demonstrate improvement in overall mobility    Time 3   Period Weeks   Status Achieved   PT SHORT TERM GOAL #2   Title Patient to score at least a 50 on the BERG in order to demosntrate reduced fall risk    Time 3   Period Weeks   Status Achieved   PT SHORT TERM GOAL #3   Title Patient to be independent in techniques to manage freezing episodes, such as backward stepping, in order to improve overall ease of mobility and to increase self-efficacy    Baseline 6/12- has not really had any freezing episodes    Time 3   Period Weeks   Status Deferred   PT SHORT TERM GOAL #4   Title Patient to report no falls or close calls within the past 3 weeks in order to demonstrate general improvement in balance and safety    Time 3   Period Weeks   Status Achieved   PT SHORT TERM GOAL #5   Title Patient to be  independent in performing appropriate HEP, to be updated PRN    Time 3   Period Weeks   Status Achieved           PT Long  Term Goals - 08/07/15 1717    PT LONG TERM GOAL #1   Title Patient to demonstrate strength 4+/5 in order to improve overall mechanics and reduce fall risk    Baseline 6/12- improving    Time 6   Period Weeks   Status On-going   PT LONG TERM GOAL #2   Title Patient to score at least a 53 on the BERG in order to demonstrate improved safety and reduced fall risk    Time 6   Period Weeks   Status Achieved   PT LONG TERM GOAL #3   Title Patient to be performing at least 30-45 minutes of moderate-high intensity aerobic activity, at least 4 times per week, in order to improve overall health and assist in mitigating parkinsonian symtpmos    Baseline 6/12- she reoprts she has been walking on her own but is not doing aerobics    Time 6   Period Weeks   Status On-going   PT LONG TERM GOAL #4   Title Patient to be educated about and participatory in local and regional Parkinsons support and activity groups in order to improve overall mood and self-efficacy in managing condition    Baseline 6/12- educated    Time 6   Period Weeks   Status Partially Met   PT LONG TERM GOAL #5   Title Patient to be able to perform dual tasking activities with minimal unsteadiness in order to assist in function in community, home, and at work    Time 6   Period Weeks   Status Achieved               Plan - 08/07/15 1732    Clinical Impression Statement Re-assessment performed today. Patient has made excellent progress with skilled PT services, showing great improvement in gait speed/tolerance and also some improvement in functional strength however proxmial strength in hip musculature continues to remain weak. Did screen L hip today with scour test (negative), FABER (positive), and noted mild limitation in L hamstring and piriformis musculature; adivsed pattient to speak to MD  about hip pain as this could possibly be coming from the joint rather than being an effect of pure muscle weakness. Patient has met the majority of her goals at this time. At this point recommend 1 last session for advanced balance and dual tasking activiteis as well as to assign appropriate HEP.    Rehab Potential Excellent   PT Frequency Other (comment)  1 more session    PT Duration Other (comment)  1 more session    PT Treatment/Interventions ADLs/Self Care Home Management;Biofeedback;Gait training;Stair training;Functional mobility training;Therapeutic activities;Therapeutic exercise;Balance training;Neuromuscular re-education;Patient/family education;Manual techniques;Energy conservation;Taping   PT Next Visit Plan advanced PWR activities for balance and dual tasking; get testimoney with Waynetta Sandy; DC    Recommended Other Services speech and PWR! OT    Consulted and Agree with Plan of Care Patient      Patient will benefit from skilled therapeutic intervention in order to improve the following deficits and impairments:  Abnormal gait, Decreased strength, Decreased balance, Decreased mobility, Difficulty walking, Decreased coordination, Postural dysfunction, Decreased activity tolerance  Visit Diagnosis: Difficulty in walking, not elsewhere classified  Unsteadiness on feet  Muscle weakness (generalized)  Unspecified lack of coordination     Problem List Patient Active Problem List   Diagnosis Date Noted  . Upper airway cough syndrome 05/03/2015  . RLS (restless legs syndrome) 03/23/2015  . Parkinson disease ([facility]) 03/23/2015  . Fever 08/29/2011  . Headache(784.0)  08/29/2011  . Knee pain, right 08/29/2011  . Back pain 08/29/2011  . Bacteremia due to Gram-positive bacteria 08/29/2011  . Hepatitis 08/29/2011  . Periorbital edema 08/29/2011  . Facial rash 08/29/2011  . Blurred vision, right eye 08/29/2011  . Tremor 08/29/2011  . Meningitis 08/29/2011  . CHEST PAIN UNSPECIFIED  03/04/2007  . PNEUMONIA, LEFT LOWER LOBE 03/03/2007  . COUGH, CHRONIC 03/03/2007    Deniece Ree PT, DPT [facility] [address] [place], [place], [address] Phone: [phone]   Fax:  [phone]  Name: DORIANNE PERRET MRN: [id number] Date of Birth: 1950-10-02

## 2015-08-08 ENCOUNTER — Ambulatory Visit (HOSPITAL_COMMUNITY): Payer: BC Managed Care – PPO | Admitting: Physical Therapy

## 2015-08-08 DIAGNOSIS — R2681 Unsteadiness on feet: Secondary | ICD-10-CM

## 2015-08-08 DIAGNOSIS — R279 Unspecified lack of coordination: Secondary | ICD-10-CM

## 2015-08-08 DIAGNOSIS — R262 Difficulty in walking, not elsewhere classified: Secondary | ICD-10-CM

## 2015-08-08 DIAGNOSIS — M6281 Muscle weakness (generalized): Secondary | ICD-10-CM

## 2015-08-08 NOTE — Therapy (Signed)
[facility] [address] [place], [place], [address] Phone: [phone]   Fax:  [phone]  Physical Therapy Treatment (Discharge)  Patient Details  Name: [name] [name] MRN: [id number] Date of Birth: Dec 01, 1950 Referring Provider: [name]   Encounter Date: 08/08/2015      PT End of Session - 08/08/15 0909    Visit Number 11   Number of Visits 11   Date for PT Re-Evaluation 09/04/15   Authorization Type [employer] PPO Blue    Authorization Time Period 06/28/15 to 08/09/15   PT Start Time 0826  patient arrived a few minutes late, started with unbilled time on treadmill    PT Stop Time 0858   PT Time Calculation (min) 32 min   Activity Tolerance Patient tolerated treatment well   Behavior During Therapy [facility] for tasks assessed/performed      Past Medical History  Diagnosis Date  . Bursitis   . Cellulitis     arm  . Headache   . Insomnia   . Hyperlipidemia   . Onychomycosis   . Osteoarthritis   . OA (osteoarthritis)   . Tenosynovitis   . RLS (restless legs syndrome) 03/23/2015  . Parkinson disease ([place]) 03/23/2015    Past Surgical History  Procedure Laterality Date  . Tubal ligation      There were no vitals filed for this visit.      Subjective Assessment - 08/08/15 0824    Subjective Patient feeling well today, no pain or major changes since yesterday    Pertinent History hx meningitis, hx of hepatitis, RLS, Parkinsons    Patient Stated Goals improve balance   Currently in Pain? No/denies                         [facility] Adult PT Treatment/Exercise - 08/08/15 0001    Knee/Hip Exercises: Aerobic   Tread Mill x7 minutes a  at 3.0MPH, 9% elevation on treadmill   not included in billing    Knee/Hip Exercises: Standing   Other Standing Knee Exercises monster steps forward and backwards 1x260f each, cues for form    Other Standing Knee Exercises suicide drills: 5 toe raises, 332fskipping, 5 squats (3  rounds); skiipping 2x22627fforwards and backwards gait with ball toss with pace of ball toss increasing incrementially            [facility] 08/08/15 0906    PWR! Up 2x10 standing (one standard PWR form, one with jump at end)   PWR! Rock 1x30 standing with boom whaAuto-Owners Insurancewist 1x30 standing with boom whackers              PT Education - 08/08/15 0908    Education provided Yes   Education Details DC today; asked to reschedule skilled OT appt if she is still interested in this; HEP involving regular aerobic activity at 60-80% of max HR for at least 30 minutes 3-4 days per week   Person(s) Educated Patient   Methods Explanation   Comprehension Verbalized understanding          PT Short Term Goals - 08/07/15 1715    PT SHORT TERM GOAL #1   Title Patient to be able to perform backwards and forwards gait with good foot clearance, minimal shuffling, upright posture, and minimal unsteadiness in order to demonstrate improvement in overall mobility    Time 3   Period Weeks   Status Achieved  PT SHORT TERM GOAL #2   Title Patient to score at least a 50 on the BERG in order to demosntrate reduced fall risk    Time 3   Period Weeks   Status Achieved   PT SHORT TERM GOAL #3   Title Patient to be independent in techniques to manage freezing episodes, such as backward stepping, in order to improve overall ease of mobility and to increase self-efficacy    Baseline 6/12- has not really had any freezing episodes    Time 3   Period Weeks   Status Deferred   PT SHORT TERM GOAL #4   Title Patient to report no falls or close calls within the past 3 weeks in order to demonstrate general improvement in balance and safety    Time 3   Period Weeks   Status Achieved   PT SHORT TERM GOAL #5   Title Patient to be independent in performing appropriate HEP, to be updated PRN    Time 3   Period Weeks   Status Achieved           PT Long Term Goals - 08/07/15 1717    PT LONG  TERM GOAL #1   Title Patient to demonstrate strength 4+/5 in order to improve overall mechanics and reduce fall risk    Baseline 6/12- improving    Time 6   Period Weeks   Status On-going   PT LONG TERM GOAL #2   Title Patient to score at least a 53 on the BERG in order to demonstrate improved safety and reduced fall risk    Time 6   Period Weeks   Status Achieved   PT LONG TERM GOAL #3   Title Patient to be performing at least 30-45 minutes of moderate-high intensity aerobic activity, at least 4 times per week, in order to improve overall health and assist in mitigating parkinsonian symtpmos    Baseline 6/12- she reoprts she has been walking on her own but is not doing aerobics    Time 6   Period Weeks   Status On-going   PT LONG TERM GOAL #4   Title Patient to be educated about and participatory in local and regional Parkinsons support and activity groups in order to improve overall mood and self-efficacy in managing condition    Baseline 6/12- educated    Time 6   Period Weeks   Status Partially Met   PT LONG TERM GOAL #5   Title Patient to be able to perform dual tasking activities with minimal unsteadiness in order to assist in function in community, home, and at work    Time 6   Period Weeks   Status Achieved               Plan - 08/08/15 0910    Clinical Impression Statement Began session with unbilled exercise on treadmill at 3.0MPH and 9% incline. Focused on high amplitute movements today in advanced dual tasking scenarios, including monster walks forward and backwards, advanced version of PWR moves, and also intorduced skipping today. Patient had difficulty with skiping likely due to coordination aspect of tasks today event with cues. Educated apteint regarding importance of regular physical activity at 60-80% of max HR at least 3-4 days per week in order to assist in maintaining functional gains and mitigating Parkinsonian symtoms. Provided information regarding  group PWR classes in [place], also regarding [employer] in [place] as well, also collected information for local walking club. Patient has  made excellent progress with skilled PT services, DC today.    Rehab Potential Excellent   PT Treatment/Interventions ADLs/Self Care Home Management;Biofeedback;Gait training;Stair training;Functional mobility training;Therapeutic activities;Therapeutic exercise;Balance training;Neuromuscular re-education;Patient/family education;Manual techniques;Energy conservation;Taping   PT Next Visit Plan DC today    Consulted and Agree with Plan of Care Patient      Patient will benefit from skilled therapeutic intervention in order to improve the following deficits and impairments:  Abnormal gait, Decreased strength, Decreased balance, Decreased mobility, Difficulty walking, Decreased coordination, Postural dysfunction, Decreased activity tolerance  Visit Diagnosis: Difficulty in walking, not elsewhere classified  Unsteadiness on feet  Muscle weakness (generalized)  Unspecified lack of coordination     Problem List Patient Active Problem List   Diagnosis Date Noted  . Upper airway cough syndrome 05/03/2015  . RLS (restless legs syndrome) 03/23/2015  . Parkinson disease ([place]) 03/23/2015  . Fever 08/29/2011  . Headache(784.0) 08/29/2011  . Knee pain, right 08/29/2011  . Back pain 08/29/2011  . Bacteremia due to Gram-positive bacteria 08/29/2011  . Hepatitis 08/29/2011  . Periorbital edema 08/29/2011  . Facial rash 08/29/2011  . Blurred vision, right eye 08/29/2011  . Tremor 08/29/2011  . Meningitis 08/29/2011  . CHEST PAIN UNSPECIFIED 03/04/2007  . PNEUMONIA, LEFT LOWER LOBE 03/03/2007  . COUGH, CHRONIC 03/03/2007    PHYSICAL THERAPY DISCHARGE SUMMARY  Visits from Start of Care: 11  Current functional level related to goals / functional outcomes: Patient has made excellent progress with skilled PT services, and demonstrates good  functional gains with last remaining impairments being mild unsteadiness, L hip pain, and very mild Parkinsonian symptoms at this point. She is appropriate for DC to aerobic exercise program and community resources.    Remaining deficits: Functional muscle weakness, mild unsteadiness, mild Parkinsonian symptoms such as difficulty with coordination. L hip pain    Education / Equipment: Aerobic exercise HEP; information regarding PWR and [employer] classes in [place]; local walking club starting later this summer  Plan: Patient agrees to discharge.  Patient goals were partially met. Patient is being discharged due to being pleased with the current functional level.  ?????        Deniece Ree PT, DPT [facility] [address] [place], [place], [address] Phone: [phone]   Fax:  [phone]  Name: [name] [name] MRN: [id number] Date of Birth: 29-Jan-1951

## 2015-08-08 NOTE — Patient Instructions (Signed)
Perform moderate-high intensity aerobic activity 3-4 times per week, for at least 30 minutes.   We base this off of your heart rate.   Your Max heart rate is 220-63= 157 beats per minute. So, your target heart rate range is 94 beats per minute to 125 beats per minute.   Another way to measure this is your Rate of Perceived Exertion (RPE). With any given workout, your RPE should be 7-8 out of 10 (10/10 is the hardest thing ever).

## 2015-08-09 ENCOUNTER — Ambulatory Visit (HOSPITAL_COMMUNITY): Payer: BC Managed Care – PPO | Admitting: Physical Therapy

## 2015-08-15 ENCOUNTER — Ambulatory Visit (HOSPITAL_COMMUNITY): Payer: BC Managed Care – PPO | Admitting: Speech Pathology

## 2015-08-15 ENCOUNTER — Ambulatory Visit (HOSPITAL_COMMUNITY): Payer: Self-pay | Admitting: Physical Therapy

## 2015-08-21 ENCOUNTER — Ambulatory Visit (INDEPENDENT_AMBULATORY_CARE_PROVIDER_SITE_OTHER): Payer: BC Managed Care – PPO | Admitting: Neurology

## 2015-08-21 ENCOUNTER — Encounter: Payer: Self-pay | Admitting: Neurology

## 2015-08-21 ENCOUNTER — Encounter (HOSPITAL_COMMUNITY): Payer: BC Managed Care – PPO | Admitting: Speech Pathology

## 2015-08-21 VITALS — BP 127/86 | HR 64 | Ht 66.0 in | Wt 159.0 lb

## 2015-08-21 DIAGNOSIS — G20 Parkinson's disease: Secondary | ICD-10-CM

## 2015-08-21 DIAGNOSIS — G2581 Restless legs syndrome: Secondary | ICD-10-CM

## 2015-08-21 NOTE — Progress Notes (Signed)
Reason for visit: Parkinson's disease  [name] [name] is an 65 y.o. female  History of present illness:  [name] is a 65 year old right-handed black female with a history of Parkinson's disease with left-sided features. The patient has been undergoing some physical therapy with good benefit, she believes that this has helped her ability to ambulate. The patient has joined a gym, she exercises on a regular basis. She is not on any medications for Parkinson's disease at this time. The patient mainly has manifestations of tremor involving the left arm, with decreased arm swing. She has reported some difficulty with left hip discomfort with weightbearing with pain radiating into the left groin. She also will have some stiffness of the neck and shoulders at times. She may have some sharp discomfort in the neck. She has had a problem with restless leg syndrome in the evening hours, this has been less of a problem recently. She returns to this office for an evaluation.  Past Medical History  Diagnosis Date  . Bursitis   . Cellulitis     arm  . Headache   . Insomnia   . Hyperlipidemia   . Onychomycosis   . Osteoarthritis   . OA (osteoarthritis)   . Tenosynovitis   . RLS (restless legs syndrome) 03/23/2015  . Parkinson disease ([facility]) 03/23/2015    Past Surgical History  Procedure Laterality Date  . Tubal ligation      Family History  Problem Relation Age of Onset  . Diabetes type II Sister   . Asthma Sister   . Heart failure Mother   . Stroke Mother   . Prostate cancer Father   . Diabetes type II Brother   . Tremor Sister   . Diabetes type II Brother   . Cancer Brother     prostate  . Cancer Brother     prostate  . Cancer Brother     prostate    Social history:  reports that she has never smoked. She has never used smokeless tobacco. She reports that she does not drink alcohol or use illicit drugs.    Allergies  Allergen Reactions  . Caffeine     Palpitations and  pain   . Iodine Swelling  . Iohexol      Desc: FACIAL SWELLING   . Penicillins Itching and Swelling  . Shellfish Allergy Swelling  . Sulfa Drugs Cross Reactors Nausea And Vomiting    Medications:  Prior to Admission medications   Medication Sig Start Date End Date Taking? Authorizing Provider  Calcium Carbonate-Vitamin D (CALCIUM-VITAMIN D) 500-200 MG-UNIT per tablet Take 1 tablet by mouth daily.   Yes Historical Provider, MD  Magnesium 250 MG TABS Take 1 tablet by mouth daily.   Yes Historical Provider, MD  Potassium 99 MG TABS Take 1 tablet by mouth daily.   Yes Historical Provider, MD  acetaminophen (TYLENOL) 500 MG tablet Take 1,000 mg by mouth every 6 (six) hours as needed. Reported on 08/21/2015    Historical Provider, MD    ROS:  Out of a complete 14 system review of symptoms, the patient complains only of the following symptoms, and all other reviewed systems are negative.  Difficulty walking Weakness, tremors  Blood pressure 127/86, pulse 64, height 5\' 6"  (1.676 m), weight 159 lb (72.122 kg).  Physical Exam  General: The patient is alert and cooperative at the time of the examination.  Neuromuscular: The patient has no significant discomfort with internal or external rotation of the  left hip. She has good range of movement of the cervical spine.  Skin: No significant peripheral edema is noted.   Neurologic Exam  Mental status: The patient is alert and oriented x 3 at the time of the examination. The patient has apparent normal recent and remote memory, with an apparently normal attention span and concentration ability.   Cranial nerves: Facial symmetry is present. Speech is normal, no aphasia or dysarthria is noted. Extraocular movements are full. Visual fields are full. The patient has good animation of the face.  Motor: The patient has good strength in all 4 extremities.  Sensory examination: Soft touch sensation is symmetric on the face, arms, and  legs.  Coordination: The patient has good finger-nose-finger and heel-to-shin bilaterally.  Gait and station: The patient has the ability to stand with arms crossed from a seated position. Once up, the patient ambulates with good stride, there is decreased arm swing with the left arm, the left arm is held in flexion, tremors noted with the left arm. Tandem gait is normal. Romberg is negative. No drift is seen.  Reflexes: Deep tendon reflexes are symmetric.   Assessment/Plan:  1. Parkinson's disease, left-sided features  The patient is doing better following physical therapy. The patient reports some neck discomfort and some left hip discomfort may be associated with arthritis. We are following the patient conservatively at this time, will come back in 6 months, at some point we will add a dopamine agonist medication to her drug regimen.  Marlan Palau. Keith Willis MD 08/21/2015 9:12 PM  [facility] [address] [place], [place] [id number]  Phone [phone] Fax [phone]

## 2015-08-22 ENCOUNTER — Ambulatory Visit (HOSPITAL_COMMUNITY): Payer: BC Managed Care – PPO | Admitting: Occupational Therapy

## 2015-08-22 ENCOUNTER — Encounter (HOSPITAL_COMMUNITY): Payer: Self-pay | Admitting: Occupational Therapy

## 2015-08-22 DIAGNOSIS — R278 Other lack of coordination: Secondary | ICD-10-CM

## 2015-08-22 DIAGNOSIS — M6281 Muscle weakness (generalized): Secondary | ICD-10-CM

## 2015-08-22 DIAGNOSIS — R262 Difficulty in walking, not elsewhere classified: Secondary | ICD-10-CM | POA: Diagnosis not present

## 2015-08-22 DIAGNOSIS — R29898 Other symptoms and signs involving the musculoskeletal system: Secondary | ICD-10-CM

## 2015-08-22 NOTE — Therapy (Signed)
[facility] [address] [place], [place], [address] Phone: [phone]   Fax:  [phone]  Occupational Therapy Evaluation  Patient Details  Name: [name] [name] MRN: [id number] Date of Birth: [birth year] Referring Provider: Dr. Stephanie Acre  Encounter Date: 08/22/2015      OT End of Session - 08/22/15 1605    Visit Number 1   Number of Visits 7   Date for OT Re-Evaluation 10/03/15  mini-reassessment 09/19/2015   Authorization Type BCBS    OT Start Time 1432   OT Stop Time 1513   OT Time Calculation (min) 41 min   Activity Tolerance Patient tolerated treatment well   Behavior During Therapy [facility] for tasks assessed/performed      Past Medical History  Diagnosis Date  . Bursitis   . Cellulitis     arm  . Headache   . Insomnia   . Hyperlipidemia   . Onychomycosis   . Osteoarthritis   . OA (osteoarthritis)   . Tenosynovitis   . RLS (restless legs syndrome) 03/23/2015  . Parkinson disease (HCC) 03/23/2015    Past Surgical History  Procedure Laterality Date  . Tubal ligation      There were no vitals filed for this visit.      Subjective Assessment - 08/22/15 1556    Subjective  S: I went to the doctor yesterday and he was very pleased.    Pertinent History Pt is a 65 y/o female with diagnosis of Parkinsons who completed [facility] PT services at the beginning of June 2017. Pt reports generalized weakness in her hands and arms, more severe in the LUE. Pt reports difficulty with coordination tasks as well. 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 Dr. Stephanie Acre referred pt to occupational therapy for evaluation and treatment.    Patient Stated Goals To have mor strength in my hands and arms.    Currently in Pain? No/denies           [facility] OT Assessment - 08/22/15 1432    Assessment   Diagnosis Parkinsons    Referring Provider Dr. Stephanie Acre   Onset Date --  symptoms present 11/2014,  diagnosed 02/2015   Prior Therapy [facility] PT May/June 2017   Precautions   Precautions Fall   Restrictions   Weight Bearing Restrictions No   Balance Screen   Has the patient fallen in the past 6 months No   Has the patient had a decrease in activity level because of a fear of falling?  No   Is the patient reluctant to leave their home because of a fear of falling?  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 Pt reports general fatigue at times during ADL tasks.    Written Expression   Dominant Hand Right   Cognition   Overall Cognitive Status Within Functional Limits for tasks assessed   Coordination   9 Hole Peg Test Right;Left   Right 9 Hole Peg Test 20.15"   Left 9 Hole Peg  Test 24.91"   ROM / Strength   AROM / PROM / Strength Strength   Strength   Overall Strength Comments Assessed seated, ER/IR adducted   Strength Assessment Site Shoulder;Elbow;Hand;Wrist   Right/Left Shoulder Right;Left   Right Shoulder Flexion 4+/5   Right Shoulder ABduction 4+/5   Right Shoulder Internal Rotation 4+/5   Right Shoulder External Rotation 4+/5   Left Shoulder Flexion 4/5   Left Shoulder ABduction 4/5   Left Shoulder Internal Rotation 4/5   Left Shoulder External Rotation 4-/5   Right/Left Elbow Right;Left   Right Elbow Flexion 4+/5   Right Elbow Extension 4+/5   Left Elbow Flexion 4/5   Left Elbow Extension 4/5   Right/Left Wrist Right;Left   Right Wrist Flexion 4+/5   Right Wrist  Extension 4+/5   Left Wrist Flexion 4/5   Left Wrist Extension 4/5   Right/Left hand Right;Left   Right Hand Gross Grasp Functional   Right Hand Grip (lbs) 55   Right Hand Lateral Pinch 13 lbs   Right Hand 3 Point Pinch 10 lbs   Left Hand Gross Grasp Functional   Left Hand Grip (lbs) 42   Left Hand Lateral Pinch 10 lbs   Left Hand 3 Point Pinch 11 lbs                  OT Treatments/Exercises (OP) - 08/22/15 1616    Splinting   Splinting Pt fitted with a oval ring splint, size 6, for swan neck deformity. Pt educated on splint wear and care.                OT Education - 08/22/15 1604    Education provided Yes   Education Details splint wear and care; instructed to add finger movements to [facility]! exercises.    Person(s) Educated Patient   Methods Explanation   Comprehension Verbalized understanding          OT Short Term Goals - 08/22/15 1612    OT SHORT TERM GOAL #1   Title Pt will be provided with and educated on HEP.    Time 6   Period Weeks   Status New   OT SHORT TERM GOAL #2   Title Pt will return to highest level of functioning and independence in daily and leisure tasks.    Time 6   Period Weeks   Status New   OT SHORT TERM GOAL #3   Title Pt will increase LUE strength to 4+/5 to improve ability to lift and carry weighted objects during daily tasks.    Time 6   Period Weeks   Status New   OT SHORT TERM GOAL #4   Title Pt will improve bilateral grip strength by 5# to improve ability to grasp and hold weighted objects.    Time 6   Period Weeks   Status New   OT SHORT TERM GOAL #5   Title Pt will increase bilateral fine motor coordination to improve ability to complete tasks such as buttoning, zipping, tying, etc, by improving 9 hole peg test score by 2" or more.    Time 6   Period Weeks   Status New                  Plan - 08/22/15 1606    Clinical Impression Statement A: Pt is a 65 y/o female with diagnosis of Parkinsons  presenting with decreased strength in LUE, decreased grip strength bilaterally, and decreased fine motor coordination. Pt  provided with oval splint for swan neck deformity this session and educated on wear and care.    Rehab Potential Good   OT Frequency 1x / week   OT Duration 6 weeks   OT Treatment/Interventions Self-care/ADL training;Energy conservation;DME and/or AE instruction;Passive range of motion;Patient/family education;Cryotherapy;Splinting;Moist Heat;Therapeutic exercise;Manual Therapy;Therapeutic activities   Plan P: Pt will benefit from skilled occupational therapy services to improve BUE strength, increase bilateral grip strength, and increase fine motor coordination to improve functioning during daily tasks. Treatment plan: BUE strengthening, grip strengthening, fine motor coordination activities, PWR! movements incorporating BUE, splint adjustment as needed.    Consulted and Agree with Plan of Care Patient      Patient will benefit from skilled therapeutic intervention in order to improve the following deficits and impairments:  Decreased strength, Decreased mobility, Impaired UE functional use, Decreased range of motion, Decreased coordination, Impaired flexibility  Visit Diagnosis: Muscle weakness (generalized)  Other lack of coordination  Other symptoms and signs involving the musculoskeletal system    Problem List Patient Active Problem List   Diagnosis Date Noted  . Upper airway cough syndrome 05/03/2015  . RLS (restless legs syndrome) 03/23/2015  . Parkinson disease (HCC) 03/23/2015  . Fever 08/29/2011  . Headache(784.0) 08/29/2011  . Knee pain, right 08/29/2011  . Back pain 08/29/2011  . Bacteremia due to Gram-positive bacteria 08/29/2011  . Hepatitis 08/29/2011  . Periorbital edema 08/29/2011  . Facial rash 08/29/2011  . Blurred vision, right eye 08/29/2011  . Tremor 08/29/2011  . Meningitis 08/29/2011  . CHEST PAIN UNSPECIFIED 03/04/2007  . PNEUMONIA,  LEFT LOWER LOBE 03/03/2007  . COUGH, CHRONIC 03/03/2007    Ezra SitesLeslie Gurjot Brisco, OTR/L  [phone]  08/22/2015, 4:17 PM  [facility] [address] [place], [place], [address] Phone: [phone]   Fax:  [phone]  Name: [name] [name] MRN: [id number] Date of Birth: [birth year]

## 2015-08-31 ENCOUNTER — Encounter (HOSPITAL_COMMUNITY): Payer: Self-pay | Admitting: Occupational Therapy

## 2015-08-31 ENCOUNTER — Ambulatory Visit (HOSPITAL_COMMUNITY): Payer: BC Managed Care – PPO | Attending: Neurology | Admitting: Occupational Therapy

## 2015-08-31 DIAGNOSIS — R278 Other lack of coordination: Secondary | ICD-10-CM

## 2015-08-31 DIAGNOSIS — M6281 Muscle weakness (generalized): Secondary | ICD-10-CM

## 2015-08-31 DIAGNOSIS — R29898 Other symptoms and signs involving the musculoskeletal system: Secondary | ICD-10-CM | POA: Diagnosis present

## 2015-08-31 NOTE — Therapy (Signed)
[facility] [address] [place], [place], [address] Phone: [phone]   Fax:  [phone]  Occupational Therapy Treatment  Patient Details  Name: [name] [name] MRN: [id number] Date of Birth: Jul 15, 1950 Referring Provider: Dr. Stephanie Acreharles Willis  Encounter Date: 08/31/2015      OT End of Session - 08/31/15 1159    Visit Number 2   Number of Visits 7   Date for OT Re-Evaluation 10/03/15  mini-reassessment 09/19/2015   Authorization Type BCBS    OT Start Time 1034   OT Stop Time 1114   OT Time Calculation (min) 40 min   Activity Tolerance Patient tolerated treatment well   Behavior [facility] tasks assessed/performed      Past Medical History  Diagnosis Date  . Bursitis   . Cellulitis     arm  . Headache   . Insomnia   . Hyperlipidemia   . Onychomycosis   . Osteoarthritis   . OA (osteoarthritis)   . Tenosynovitis   . RLS (restless legs syndrome) 03/23/2015  . Parkinson disease (HCC) 03/23/2015    Past Surgical History  Procedure Laterality Date  . Tubal ligation      There were no vitals filed for this visit.      Subjective Assessment - 08/31/15 1039    Subjective  S: I was amazed at my improved finger movement after I wore the splint for one day.    Currently in Pain? No/denies            [facility] - 08/31/15 1037    Assessment   Diagnosis Parkinsons    Precautions   Precautions Fall                  OT Treatments/Exercises (OP) - 08/31/15 1040    Exercises   Exercises Hand   Hand Exercises   Joint Blocking Exercises DIP flex/ext, PIP flex/ext 20X each   Hand Gripper with Large Beads L: 6/6 gripper set to 29# R: 37#   Hand Gripper with Medium Beads L: 13/13 gripper set to 29# R: 37#   Hand Gripper with Small Beads L: 17/17 gripper set to 29# R: 35#   Tendon Glides 10X   Other Hand Exercises towel crumple, 10X                OT Education - 08/31/15 1159    Education provided Yes   Education Details joint blocking, towel crumple, tendon gliding exercises   Person(s) Educated Patient   Methods Explanation;Demonstration;Handout   Comprehension Verbalized understanding;Returned demonstration          OT Short Term Goals - 08/31/15 1205    OT SHORT TERM GOAL #1   Title Pt will be provided with and educated on HEP.    Time 6   Period Weeks   Status On-going   OT SHORT TERM GOAL #2   Title Pt will return to highest level of functioning and independence in daily and leisure tasks.    Time 6   Period Weeks   Status On-going   OT SHORT TERM GOAL #3   Title Pt will increase LUE strength to 4+/5 to improve ability to lift and carry weighted objects during daily tasks.    Time 6   Period Weeks   Status On-going   OT SHORT TERM GOAL #4   Title Pt will improve bilateral grip strength by 5# to improve ability to grasp and hold weighted  objects.    Time 6   Period Weeks   Status On-going   OT SHORT TERM GOAL #5   Title Pt will increase bilateral fine motor coordination to improve ability to complete tasks such as buttoning, zipping, tying, etc, by improving 9 hole peg test score by 2" or more.    Time 6   Period Weeks   Status On-going                  Plan - 08/31/15 1200    Clinical Impression Statement A: Initiated joint blocking exercises, A/ROM of left small digit, and grip strengthening this session, pt with good form, intermittent rest breaks provided for fatigue during grip strengthening exercises. Pt reports significant improvement in ability to flex small finger at PIP joint since beginning to wear oval splint. Pt provided with updated HEP to continue exercises between sessions.    Rehab Potential Good   OT Frequency 1x / week   OT Duration 6 weeks   OT Treatment/Interventions Self-care/ADL training;Energy conservation;DME and/or AE instruction;Passive range of motion;Patient/family education;Cryotherapy;Splinting;Moist  Heat;Therapeutic exercise;Manual Therapy;Therapeutic activities   Plan P: Follow up on HEP, add BUE strengthening, continue grip strengthening   Consulted and Agree with Plan of Care Patient      Patient will benefit from skilled therapeutic intervention in order to improve the following deficits and impairments:  Decreased strength, Decreased mobility, Impaired UE functional use, Decreased range of motion, Decreased coordination, Impaired flexibility  Visit Diagnosis: Muscle weakness (generalized)  Other lack of coordination  Other symptoms and signs involving the musculoskeletal system    Problem List Patient Active Problem List   Diagnosis Date Noted  . Upper airway cough syndrome 05/03/2015  . RLS (restless legs syndrome) 03/23/2015  . Parkinson disease (HCC) 03/23/2015  . Fever 08/29/2011  . Headache(784.0) 08/29/2011  . Knee pain, right 08/29/2011  . Back pain 08/29/2011  . Bacteremia due to Gram-positive bacteria 08/29/2011  . Hepatitis 08/29/2011  . Periorbital edema 08/29/2011  . Facial rash 08/29/2011  . Blurred vision, right eye 08/29/2011  . Tremor 08/29/2011  . Meningitis 08/29/2011  . CHEST PAIN UNSPECIFIED 03/04/2007  . PNEUMONIA, LEFT LOWER LOBE 03/03/2007  . COUGH, CHRONIC 03/03/2007    [name] [name], OTR/L  [phone]  08/31/2015, 12:07 PM  [facility] [address] [place], [place], [address] Phone: [phone]   Fax:  [phone]  Name: [name] [name] MRN: [id number] Date of Birth: March 14, 1950

## 2015-08-31 NOTE — Patient Instructions (Signed)
AROM: DIP Flexion / Extension   Pinch middle knuckle of ____small____ finger of left hand to prevent bending. Bend end knuckle until stretch is felt.  Straighten finger as far as possible. Repeat _15-20___ times per set. Do ___1_ sets per session. Do _1-2___ sessions per day.  Copyright  VHI. All rights reserved.    AROM: PIP Flexion / Extension   Pinch bottom knuckle of __small______ finger of left hand to prevent bending. Actively bend middle knuckle until stretch is felt.  Straighten finger as far as possible. Repeat _15-20___ times per set. Do _1___ sets per session. Do _1-2___ sessions per day.  Copyright  VHI. All rights reserved.     Towel Crumpling Exercise   Begin with right palm down on a washcloth. Maintaining contact between surface and heel of hand, crumple towel into a ball. Repeat _10___ times per set. Do _1___ sets per session. Do __1-2__ sessions per day.  Copyright  VHI. All rights reserved.

## 2015-09-05 ENCOUNTER — Ambulatory Visit (HOSPITAL_COMMUNITY): Payer: BC Managed Care – PPO | Admitting: Occupational Therapy

## 2015-09-05 ENCOUNTER — Encounter (HOSPITAL_COMMUNITY): Payer: Self-pay | Admitting: Occupational Therapy

## 2015-09-05 DIAGNOSIS — M6281 Muscle weakness (generalized): Secondary | ICD-10-CM

## 2015-09-05 DIAGNOSIS — R278 Other lack of coordination: Secondary | ICD-10-CM

## 2015-09-05 DIAGNOSIS — R29898 Other symptoms and signs involving the musculoskeletal system: Secondary | ICD-10-CM

## 2015-09-05 NOTE — Patient Instructions (Signed)
Strengthening exercises: complete with 1-2 pound weight. Repeat all exercises 10-15 times, 1-2 times per day.    1) Shoulder Protraction    Begin with elbows by your side, slowly "punch" straight out in front of you keeping arms/elbows straight.      2) Shoulder Flexion  Supine:     Standing:         Begin with arms at your side with thumbs pointed up, slowly raise both arms up and forward towards overhead.               3) Horizontal abduction/adduction  Supine:   Standing:           Begin with arms straight out in front of you, bring out to the side in at "T" shape. Keep arms straight entire time.                 4) Internal & External Rotation    *No band* -Stand with elbows at the side and elbows bent 90 degrees. Move your forearms away from your body, then bring back inward toward the body.     5) Shoulder Abduction  Supine:     Standing:       Lying on your back begin with your arms flat on the table next to your side. Slowly move your arms out to the side so that they go overhead, in a jumping jack or snow angel movement.    6) X to V arms (cheerleader move):  Begin with arms straight down, crossed in front of body in an "X". Keeping arms crossed, lift arms straight up overhead. Then spread arms apart into a "V" shape.  Bring back together into x and lower down to starting position.    7) Wall push-ups, standing 1 foot from wall, complete 10X.

## 2015-09-05 NOTE — Therapy (Addendum)
[place], [place], [address] Phone: [phone]   Fax:  [phone]  Occupational Therapy Treatment  Patient Details  Name: [name] [name] MRN: [id number] Date of Birth: 04-27-1950 Referring Provider: Dr. Margette Fast  Encounter Date: 09/05/2015      OT End of Session - 09/05/15 1213    Visit Number 3   Number of Visits 7   Date for OT Re-Evaluation 10/03/15  mini-reassessment 09/19/2015   Authorization Type BCBS    OT Start Time 1120   OT Stop Time 1201   OT Time Calculation (min) 41 min   Activity Tolerance Patient tolerated treatment well   Behavior During Therapy [facility] for tasks assessed/performed      Past Medical History  Diagnosis Date  . Bursitis   . Cellulitis     arm  . Headache   . Insomnia   . Hyperlipidemia   . Onychomycosis   . Osteoarthritis   . OA (osteoarthritis)   . Tenosynovitis   . RLS (restless legs syndrome) 03/23/2015  . Parkinson disease ([place]) 03/23/2015    Past Surgical History  Procedure Laterality Date  . Tubal ligation      There were no vitals filed for this visit.      Subjective Assessment - 09/05/15 1122    Subjective  S: I've been doing my exercises, they're going well.    Currently in Pain? No/denies            [facility] OT Assessment - 09/05/15 1121    Assessment   Diagnosis Parkinsons    Precautions   Precautions Fall                  OT Treatments/Exercises (OP) - 09/05/15 1124    Exercises   Exercises Shoulder;Elbow;Hand   Shoulder Exercises: Supine   Protraction Strengthening;10 reps;Both   Protraction Weight (lbs) 1   Horizontal ABduction Strengthening;10 reps;Both   Horizontal ABduction Weight (lbs) 1   External Rotation Strengthening;10 reps;Both   External Rotation Weight (lbs) 1   Internal Rotation Strengthening;10 reps;Both   Internal Rotation Weight (lbs) 1   Flexion Strengthening;10 reps;Both   Shoulder Flexion Weight  (lbs) 1   ABduction Strengthening;10 reps;Both   Shoulder ABduction Weight (lbs) 1   Shoulder Exercises: ROM/Strengthening   X to V Arms 10X, 1# weight   Elbow Exercises   Elbow Extension Strengthening;10 reps;Both   Bar Weights/Barbell (Elbow Extension) 1 lb   Other elbow exercises Elbow flexion 10X, 1# weight   Additional Elbow Exercises   Hand Gripper with Large Beads L: 6/6 gripper set to 35# R: 42#   Hand Gripper with Medium Beads L: 13/13 gripper set to 35# R: 42#   Hand Gripper with Small Beads L: 17/17 gripper set to 35# R: 42#   Fine Motor Coordination   Fine Motor Coordination Grooved pegs   Grooved pegs Pt completed grooved pegboard, holding 5-6 pegs at a time and placing into pegboard with left hand. Pt completed in appropriate amount of time, min-mod difficulty with turning and placing pegs.                 OT Education - 09/05/15 1213    Education provided Yes   Education Details shoulder strengthening HEP   Person(s) Educated Patient   Methods Explanation;Demonstration;Handout   Comprehension Verbalized understanding;Returned demonstration          OT Short Term Goals - 08/31/15 1205    OT  SHORT TERM GOAL #1   Title Pt will be provided with and educated on HEP.    Time 6   Period Weeks   Status On-going   OT SHORT TERM GOAL #2   Title Pt will return to highest level of functioning and independence in daily and leisure tasks.    Time 6   Period Weeks   Status On-going   OT SHORT TERM GOAL #3   Title Pt will increase LUE strength to 4+/5 to improve ability to lift and carry weighted objects during daily tasks.    Time 6   Period Weeks   Status On-going   OT SHORT TERM GOAL #4   Title Pt will improve bilateral grip strength by 5# to improve ability to grasp and hold weighted objects.    Time 6   Period Weeks   Status On-going   OT SHORT TERM GOAL #5   Title Pt will increase bilateral fine motor coordination to improve ability to complete tasks  such as buttoning, zipping, tying, etc, by improving 9 hole peg test score by 2" or more.    Time 6   Period Weeks   Status On-going                  Plan - 09/05/15 1213    Clinical Impression Statement A: Added shoulder strengthening exercises with 1# weight this session, pt with mod fatigue at end of session. Pt required verbal and visual cuing for completion with proper form. Continued grip strengthening, increased resistance for both hands, min difficulty. Added BUE strengthening to HEP.    Rehab Potential Good   OT Frequency 1x / week   OT Duration 6 weeks   OT Treatment/Interventions Self-care/ADL training;Energy conservation;DME and/or AE instruction;Passive range of motion;Patient/family education;Cryotherapy;Splinting;Moist Heat;Therapeutic exercise;Manual Therapy;Therapeutic activities   Plan P: Pt is out of town next week, jury duty the next week. Reassess when returns to therapy.    Consulted and Agree with Plan of Care Patient      Patient will benefit from skilled therapeutic intervention in order to improve the following deficits and impairments:  Decreased strength, Decreased mobility, Impaired UE functional use, Decreased range of motion, Decreased coordination, Impaired flexibility  Visit Diagnosis: Muscle weakness (generalized)  Other lack of coordination  Other symptoms and signs involving the musculoskeletal system    Problem List Patient Active Problem List   Diagnosis Date Noted  . Upper airway cough syndrome 05/03/2015  . RLS (restless legs syndrome) 03/23/2015  . Parkinson disease (HCC) 03/23/2015  . Fever 08/29/2011  . Headache(784.0) 08/29/2011  . Knee pain, right 08/29/2011  . Back pain 08/29/2011  . Bacteremia due to Gram-positive bacteria 08/29/2011  . Hepatitis 08/29/2011  . Periorbital edema 08/29/2011  . Facial rash 08/29/2011  . Blurred vision, right eye 08/29/2011  . Tremor 08/29/2011  . Meningitis 08/29/2011  . CHEST PAIN  UNSPECIFIED 03/04/2007  . PNEUMONIA, LEFT LOWER LOBE 03/03/2007  . COUGH, CHRONIC 03/03/2007    Ezra Sites, OTR/L  [phone]  09/05/2015, 12:15 PM  [facility] [address] [place], [place], [address] Phone: [phone]   Fax:  [phone]  Name: [name] [name] MRN: [id number] Date of Birth: Jan 22, 1951    OCCUPATIONAL THERAPY DISCHARGE SUMMARY  Visits from Start of Care: 3  Current functional level related to goals / functional outcomes: Pt called requesting to be discharged from OT services, no goals met, current functional status undetermined.     Plan:  Patient agrees to discharge.  Patient goals were not met. Patient is being discharged due to the patient's request.  ?????

## 2015-09-12 ENCOUNTER — Ambulatory Visit (HOSPITAL_COMMUNITY): Payer: BC Managed Care – PPO | Admitting: Occupational Therapy

## 2015-09-13 ENCOUNTER — Telehealth (HOSPITAL_COMMUNITY): Payer: Self-pay

## 2015-09-13 ENCOUNTER — Ambulatory Visit (HOSPITAL_COMMUNITY): Payer: BC Managed Care – PPO | Admitting: Speech Pathology

## 2015-09-13 NOTE — Telephone Encounter (Signed)
09/13/15 pt left a message that she didn't know she had this appt.  She had cx the next 2 weeks because of vacation and jury duty

## 2015-09-19 ENCOUNTER — Encounter (HOSPITAL_COMMUNITY): Payer: BC Managed Care – PPO | Admitting: Occupational Therapy

## 2015-09-26 ENCOUNTER — Encounter (HOSPITAL_COMMUNITY): Payer: BC Managed Care – PPO | Admitting: Occupational Therapy

## 2015-10-04 ENCOUNTER — Encounter (HOSPITAL_COMMUNITY): Payer: BC Managed Care – PPO | Admitting: Occupational Therapy

## 2015-10-11 ENCOUNTER — Encounter (HOSPITAL_COMMUNITY): Payer: BC Managed Care – PPO | Admitting: Occupational Therapy

## 2016-02-22 ENCOUNTER — Ambulatory Visit (INDEPENDENT_AMBULATORY_CARE_PROVIDER_SITE_OTHER): Payer: BC Managed Care – PPO | Admitting: Neurology

## 2016-02-22 ENCOUNTER — Encounter: Payer: Self-pay | Admitting: Neurology

## 2016-02-22 VITALS — BP 130/82 | HR 73 | Ht 66.0 in | Wt 168.5 lb

## 2016-02-22 DIAGNOSIS — G20 Parkinson's disease: Secondary | ICD-10-CM

## 2016-02-22 DIAGNOSIS — G2581 Restless legs syndrome: Secondary | ICD-10-CM

## 2016-02-22 MED ORDER — PRAMIPEXOLE DIHYDROCHLORIDE 0.125 MG PO TABS: 0.3750 mg | ORAL_TABLET | Freq: Three times a day (TID) | ORAL | 1 refills | Status: DC

## 2016-02-22 NOTE — Patient Instructions (Signed)
   With the Mirapex 0.125 mg tablets, start on 1 tablet three times a day for 3 weeks, then take 2 tablets three times a day for 3 weeks, then take 3 tablets three times a day.  Mirapex (pramipexole) may result in confusion or hallucinations, dizziness, drowsiness, or nausea. If any significant side effects are noted, please contact our office.

## 2016-02-22 NOTE — Progress Notes (Signed)
Reason for visit: Parkinson's disease  [name] [name] is an 65 y.o. female  History of present illness:  [name] is a 65 year old right-handed black female with a history of mild parkinsonism. The patient has predominantly left-sided symptoms with tremor on the left arm, she is now noting some tremor on the left leg as well and some episodes of freezing with the left leg when she tries to walk. The patient has not had any falls. She is remaining relatively active. She is on no medications whatsoever for Parkinson's disease at this point. She does have a history of restless leg syndrome. She returns for an evaluation.  Past Medical History:  Diagnosis Date  . Bursitis   . Cellulitis    arm  . Headache   . Hyperlipidemia   . Insomnia   . OA (osteoarthritis)   . Onychomycosis   . Osteoarthritis   . Parkinson disease (HCC) 03/23/2015  . RLS (restless legs syndrome) 03/23/2015  . Tenosynovitis     Past Surgical History:  Procedure Laterality Date  . TUBAL LIGATION      Family History  Problem Relation Age of Onset  . Diabetes type II Sister   . Asthma Sister   . Heart failure Mother   . Stroke Mother   . Prostate cancer Father   . Diabetes type II Brother   . Tremor Sister   . Diabetes type II Brother   . Cancer Brother     prostate  . Cancer Brother     prostate  . Cancer Brother     prostate    Social history:  reports that she has never smoked. She has never used smokeless tobacco. She reports that she does not drink alcohol or use drugs.    Allergies  Allergen Reactions  . Caffeine     Palpitations and pain   . Iodine Swelling  . Iohexol      Desc: FACIAL SWELLING   . Penicillins Itching and Swelling  . Shellfish Allergy Swelling  . Sulfa Drugs Cross Reactors Nausea And Vomiting    Medications:  Prior to Admission medications   Medication Sig Start Date End Date Taking? Authorizing Provider  acetaminophen (TYLENOL) 500 MG tablet Take 1,000 mg by  mouth every 6 (six) hours as needed. Reported on 08/21/2015   Yes Historical Provider, MD  Calcium Carbonate-Vitamin D (CALCIUM-VITAMIN D) 500-200 MG-UNIT per tablet Take 1 tablet by mouth daily.   Yes Historical Provider, MD  Magnesium 250 MG TABS Take 1 tablet by mouth daily.   Yes Historical Provider, MD  Omega-3 Fatty Acids (FISH OIL) 1200 MG CAPS Take 1 capsule by mouth 2 (two) times daily.   Yes Historical Provider, MD  Potassium 99 MG TABS Take 1 tablet by mouth daily.   Yes Historical Provider, MD  pramipexole (MIRAPEX) 0.125 MG tablet Take 3 tablets (0.375 mg total) by mouth 3 (three) times daily. 02/22/16   York Spanielharles K Gaelen Brager, MD    ROS:  Out of a complete 14 system review of symptoms, the patient complains only of the following symptoms, and all other reviewed systems are negative.  Cough, chest tightness Constipation Speech difficulty, tremors  Blood pressure 130/82, pulse 73, height 5\' 6"  (1.676 m), weight 168 lb 8 oz (76.4 kg).  Physical Exam  General: The patient is alert and cooperative at the time of the examination.  Skin: No significant peripheral edema is noted.   Neurologic Exam  Mental status: The patient is alert  and oriented x 3 at the time of the examination. The patient has apparent normal recent and remote memory, with an apparently normal attention span and concentration ability.   Cranial nerves: Facial symmetry is present. Speech is normal, no aphasia or dysarthria is noted. Extraocular movements are full. Visual fields are full. Minimal masking of the face is seen.  Motor: The patient has good strength in all 4 extremities.  Sensory examination: Soft touch sensation is symmetric on the face, arms, and legs.  Coordination: The patient has good finger-nose-finger and heel-to-shin bilaterally. A resting tremor is noted on the left upper extremity.  Gait and station: The patient has a normal gait. With walking, there is decreased arm swing on the left.  Tandem gait is normal. Romberg is negative. No drift is seen.  Reflexes: Deep tendon reflexes are symmetric.   Assessment/Plan:  1. Parkinson's disease with left-sided features  The patient is beginning to have some freezing episodes involving the left leg. We will add low-dose Mirapex at this time using the 0.125 mg tablets, gradually working up on the dose to 0.375 mg 3 times daily. The patient will contact our office if she is having side effects on the drug, we will follow-up in 5 months.    Marlan Palau. Keith Yutaka Holberg MD 02/22/2016 11:20 AM  [facility] [address] [place] [id number]  Phone [phone] Fax [phone]

## 2016-04-22 ENCOUNTER — Encounter: Payer: Self-pay | Admitting: Allergy

## 2016-04-22 ENCOUNTER — Ambulatory Visit (INDEPENDENT_AMBULATORY_CARE_PROVIDER_SITE_OTHER): Payer: BC Managed Care – PPO | Admitting: Allergy

## 2016-04-22 VITALS — BP 130/80 | HR 65 | Temp 98.5°F | Resp 16 | Ht 66.93 in | Wt 168.4 lb

## 2016-04-22 DIAGNOSIS — T783XXA Angioneurotic edema, initial encounter: Secondary | ICD-10-CM | POA: Diagnosis not present

## 2016-04-22 DIAGNOSIS — Z91018 Allergy to other foods: Secondary | ICD-10-CM

## 2016-04-22 DIAGNOSIS — L509 Urticaria, unspecified: Secondary | ICD-10-CM | POA: Diagnosis not present

## 2016-04-22 LAB — COMPREHENSIVE METABOLIC PANEL
ALBUMIN: 4.1 g/dL (ref 3.6–5.1)
ALT: 24 U/L (ref 6–29)
AST: 19 U/L (ref 10–35)
Alkaline Phosphatase: 53 U/L (ref 33–130)
BILIRUBIN TOTAL: 0.4 mg/dL (ref 0.2–1.2)
BUN: 14 mg/dL (ref 7–25)
CALCIUM: 9.8 mg/dL (ref 8.6–10.4)
CHLORIDE: 105 mmol/L (ref 98–110)
CO2: 28 mmol/L (ref 20–31)
CREATININE: 0.72 mg/dL (ref 0.50–0.99)
Glucose, Bld: 80 mg/dL (ref 65–99)
Potassium: 4.6 mmol/L (ref 3.5–5.3)
SODIUM: 140 mmol/L (ref 135–146)
TOTAL PROTEIN: 7.5 g/dL (ref 6.1–8.1)

## 2016-04-22 LAB — CBC WITH DIFFERENTIAL/PLATELET
BASOS ABS: 0 {cells}/uL (ref 0–200)
Basophils Relative: 0 %
EOS ABS: 146 {cells}/uL (ref 15–500)
Eosinophils Relative: 2 %
HEMATOCRIT: 37.8 % (ref 35.0–45.0)
HEMOGLOBIN: 12.4 g/dL (ref 11.7–15.5)
LYMPHS ABS: 2628 {cells}/uL (ref 850–3900)
Lymphocytes Relative: 36 %
MCH: 29.1 pg (ref 27.0–33.0)
MCHC: 32.8 g/dL (ref 32.0–36.0)
MCV: 88.7 fL (ref 80.0–100.0)
MONO ABS: 730 {cells}/uL (ref 200–950)
MPV: 10.1 fL (ref 7.5–12.5)
Monocytes Relative: 10 %
NEUTROS ABS: 3796 {cells}/uL (ref 1500–7800)
Neutrophils Relative %: 52 %
Platelets: 255 10*3/uL (ref 140–400)
RBC: 4.26 MIL/uL (ref 3.80–5.10)
RDW: 15.2 % — ABNORMAL HIGH (ref 11.0–15.0)
WBC: 7.3 10*3/uL (ref 3.8–10.8)

## 2016-04-22 MED ORDER — EPINEPHRINE 0.3 MG/0.3ML IJ SOAJ: 0.3000 mg | Freq: Once | INTRAMUSCULAR | 1 refills | Status: AC

## 2016-04-22 NOTE — Patient Instructions (Addendum)
Swelling and Hives    - at this time no identifiable trigger at this time    - will obtain following labwork:  CBC w diff, CMP, tryptase level, chronic hive panel    - let us know which medications you were recommended to take    - would recommend you start a high dose antihistamine regimen -- Zyrtec 10mg , Pepcid 20 mg daily.   If you continue to have hives increase to twice a day dosing.       Food allergy   - will obtain shellfish panel   - continue avoidance at this time   - have access to self-injectable epinephrine and emergency plan  Follow-up 2-3 months

## 2016-04-22 NOTE — Progress Notes (Signed)
New Patient Note  RE: [name] [name] MRN: [id number] DOB: 1950/09/19 Date of Office Visit: 04/22/2016  Referring provider: Selinda Flavin, MD Primary care provider: Selinda Flavin, MD  Chief Complaint: Facial swelling and hives  History of present illness: [name] [name] is a 66 y.o. female presenting today for consultation for facial swelling and hives.  She is a former patient of ours last seen by Dr. Willa [name] in October 2006 for history of asthma and allergic rhinoconjunctivitis.  She returns today for a new issue.  Since Oct 2017 she has been having lip and facial swelling and urticarial episodes.  The swelling would start in the evenings.   She thought maybe it was something she was eating but couldn't pinpoint a specific food triggering this.  The lip swelling would resolve over night.   She then start having swelling of her cheeks.   She also has had trouble getting her ring on and feel she has had swelling of her fingers.  She has also had hives which started this past week.  Hives last less than 24 hours and leave no marks/bruising.  No fevers.  She has used benadryl to help with the hives.   She did see her PCP in Oct with the swelling.  She was recommend to take zantac and 2 other medications that she she does not remember. She reports she took the combination of medications for a day but feels like the medications made her sick to her stomach with nausea so she stopped taking these. She was also dealing with the loss of her sister around that time that she is not sure if she was reacting to medication or to the stress of that time.    She fell at work on 04/04/16 and has been having pain in her back, knees, elbows from the fall.  She has been taking Advil on 3 different days since the fall. She does not report that Advil use has flared/worsen her hives.  She denies any new foods, new medications, stings, changes in soaps/lotions/detergents or any preceding illnesses prior to onset of swelling  and urticaria. She has had urticaria for which she reacted to penicillin.    She has a shellfish allergy and continues to avoid.  She recalls tingling of the lips when she was in her 69s with shellfish ingestion.  She does not have access to self injectable epinephrine  She reports having no issues with asthma or allergy symptoms really since her last visit with [place].  She has been diagnosed with Parkinsons disease.    Review of systems: Review of Systems  Constitutional: Negative for chills, fever and malaise/fatigue.  HENT: Negative for congestion, ear pain, nosebleeds, sinus pain and sore throat.   Eyes: Negative for discharge and redness.  Respiratory: Negative for cough, shortness of breath and wheezing.   Cardiovascular: Negative for chest pain.  Gastrointestinal: Negative for abdominal pain, diarrhea, nausea and vomiting.  Musculoskeletal: Negative for joint pain and myalgias.  Skin: Positive for itching and rash.  Neurological: Negative for headaches.    All other systems negative unless noted above in HPI  Past medical history: Past Medical History:  Diagnosis Date  . Bursitis   . Cellulitis    arm  . Headache   . Hyperlipidemia   . Insomnia   . OA (osteoarthritis)   . Onychomycosis   . Osteoarthritis   . Parkinson disease (HCC) 03/23/2015  . RLS (restless legs syndrome) 03/23/2015  . Tenosynovitis  Past surgical history: Past Surgical History:  Procedure Laterality Date  . TUBAL LIGATION      Family history:  Family History  Problem Relation Age of Onset  . Diabetes type II Sister   . Asthma Sister   . Heart failure Mother   . Stroke Mother   . Prostate cancer Father   . Diabetes type II Brother   . Tremor Sister   . Diabetes type II Brother   . Cancer Brother     prostate  . Cancer Brother     prostate  . Cancer Brother     prostate  . Allergic rhinitis Neg Hx   . Angioedema Neg Hx   . Atopy Neg Hx   . Eczema Neg Hx   . Immunodeficiency Neg  Hx   . Urticaria Neg Hx     Social history: She lives at home with her husband. Home does not have carpeting there is gas heating and central cooling. There are no concerns for her damage or mildew which is in the home. 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 There is no tobacco use.   Medication List: Allergies as of 04/22/2016      Reactions   Caffeine    Palpitations and pain   Iodine Swelling   Iohexol     Desc: FACIAL SWELLING   Penicillins Itching, Swelling   Shellfish Allergy Swelling   Sulfa Drugs Cross Reactors Nausea And Vomiting      Medication List       Accurate as of 04/22/16  5:18 PM. Always use your most recent med list.          diphenhydrAMINE 25 MG tablet Commonly known as:  BENADRYL Take 25 mg by mouth every 6 (six) hours as needed.   EPINEPHrine 0.3 mg/0.3 mL Soaj injection Commonly known as:  AUVI-Q Inject 0.3 mLs (0.3 mg total) into the muscle once.   ibuprofen 200 MG tablet Commonly known as:  ADVIL,MOTRIN Take 200 mg by mouth every 6 (six) hours as needed.   Magnesium 250 MG Tabs Take 1 tablet by mouth daily.       Known medication allergies: Allergies  Allergen Reactions  . Caffeine     Palpitations and pain   . Iodine Swelling  . Iohexol      Desc: FACIAL SWELLING   . Penicillins Itching and Swelling  . Shellfish Allergy Swelling  . Sulfa Drugs Cross Reactors Nausea And Vomiting     Physical examination: Blood pressure 130/80, pulse 65, temperature 98.5 F (36.9 C), temperature source Oral, resp. rate 16, height 5' 6.93" (1.7 m), weight 168 lb 6.4 oz (76.4 kg), SpO2 98 %.  General: Alert, interactive, in no acute distress. HEENT: TMs pearly gray, turbinates non-edematous without discharge, post-pharynx non erythematous. Neck: Supple without lymphadenopathy. Lungs: Clear to auscultation without wheezing, rhonchi or rales. {no increased work of breathing. CV: Normal S1, S2 without murmurs. Abdomen:  Nondistended, nontender. Skin: Scattered erythematous urticarial type lesions primarily located On her abdomen and under her breasts , nonvesicular. Extremities:  No clubbing, cyanosis or edema. Neuro:   Grossly intact.  Diagnositics/Labs: Allergy testing: Deferred due to ongoing urticaria  Assessment and plan:   Urticaria with angioedema, chronic    - at this time no identifiable trigger.  She has not been able to pinpoint any consistencies with onset of either urticaria or angioedema which is usually the case in most people    - will obtain following labwork:  CBC w diff, CMP, tryptase level, chronic hive panel    - let us know which medications you were recommended to take - I would like to try to avoid the medications that caused nausea and for her to feel sick if possible    - would recommend you start a high dose antihistamine regimen -- Zyrtec 10mg , Pepcid 20 mg daily.   If you continue to have hives increase to twice a day dosing.  Discussed potential of adding Singulair if antihistamine regimen does not control her symptoms enough     Food allergy   - will obtain shellfish panel   - continue avoidance at this time   - have access to self-injectable epinephrine and emergency plan  Follow-up 2-3 months   I appreciate the opportunity to take part in Nyleah's care. Please do not hesitate to contact me with questions.  Sincerely,   Margo AyeShaylar Norah Fick, MD Allergy/Immunology [facility]

## 2016-04-23 ENCOUNTER — Telehealth: Payer: Self-pay | Admitting: Allergy

## 2016-04-23 LAB — ALLERGY-SHELLFISH PANEL: Lobster: <0.1 kU/L

## 2016-04-23 LAB — TRYPTASE: TRYPTASE: 3 ug/L (ref <11)

## 2016-04-23 NOTE — Telephone Encounter (Signed)
Great.  If she believes the tramazole? (possibly tramadol) was the medication that made her sick then she could go ahead with the pepcid and zyrtec daily for hive/swelling management and increase to twice a day dosing as recommended if not controlled with daily dosing.

## 2016-04-23 NOTE — Telephone Encounter (Signed)
Pt was returning [place] phone call.

## 2016-04-23 NOTE — Telephone Encounter (Signed)
Called patient back. No answer. I did not leave a second message.

## 2016-04-23 NOTE — Telephone Encounter (Signed)
Patient called and said she saw you yesterday, 04-22-16, and you wanted a list of the medication she already had at home. She has pepcid, zyrtec, tantoprazole sodium, and she said she was prescribed tramazole and she thinks that is the one that made her sick.

## 2016-04-23 NOTE — Telephone Encounter (Signed)
Left message for patient to call back  

## 2016-04-25 NOTE — Telephone Encounter (Signed)
Patient has been informed.

## 2016-04-29 ENCOUNTER — Telehealth: Payer: Self-pay | Admitting: *Deleted

## 2016-04-29 NOTE — Telephone Encounter (Signed)
Patient called office to clarify directions on taking Zyrtec and Pepcid.  Patient was informed per Dr. Delorse LekPadgett that she can increase Zyrtec and Pepcid to twice a day to manage hives/swelling if taking only once daily is not managing symptoms.  Patient voiced understanding and will contact office if taking medication twice a day does not manage her symptoms or she worsens.

## 2016-05-01 LAB — CP CHRONIC URTICARIA INDEX PANEL
TSH: 1.83 m[IU]/L
Thyroglobulin Ab: <1 IU/mL (ref <2)

## 2016-05-23 ENCOUNTER — Ambulatory Visit (INDEPENDENT_AMBULATORY_CARE_PROVIDER_SITE_OTHER): Payer: BC Managed Care – PPO | Admitting: Allergy

## 2016-05-23 ENCOUNTER — Encounter: Payer: Self-pay | Admitting: Allergy

## 2016-05-23 ENCOUNTER — Ambulatory Visit: Payer: BC Managed Care – PPO | Admitting: Allergy

## 2016-05-23 VITALS — BP 110/68 | HR 72 | Resp 14

## 2016-05-23 DIAGNOSIS — L509 Urticaria, unspecified: Secondary | ICD-10-CM

## 2016-05-23 DIAGNOSIS — T783XXD Angioneurotic edema, subsequent encounter: Secondary | ICD-10-CM

## 2016-05-23 DIAGNOSIS — Z91018 Allergy to other foods: Secondary | ICD-10-CM | POA: Diagnosis not present

## 2016-05-23 MED ORDER — MONTELUKAST SODIUM 10 MG PO TABS: 10.0000 mg | ORAL_TABLET | Freq: Every day | ORAL | 3 refills | Status: DC

## 2016-05-23 NOTE — Patient Instructions (Signed)
Swelling and Hives    - at this time no identifiable trigger at this time.  Your labwork was all reassuring and normal.      - resume use of Zyrtec 10mg  and Pepcid 20 mg twice a day.      - start singulair 10mg  daily    - let us know in 2 weeks if your hives and swelling have not decreased in frequency and we will go ahead with approval for Xolair monthly injection for management of hives and swelling.      Food allergy   - shellfish panel was negative   - recommend skin testing for shellfish to confirm your blood work (would skin test once you have been hive and swelling free for at least a month)   - continue avoidance at this time   [facility] and emergency [facility]  Follow-up for skin testing

## 2016-05-23 NOTE — Progress Notes (Signed)
Follow-up Note  RE: Areatha KeasStormy P Tamplin MRN: [id number] DOB: Jul 18, 1950 Date of Office Visit: 05/23/2016   History of present illness: [name] [name] is a 66 y.o. female presenting today for sick visit.  She woke up this morning with upper lip swelling.  She denies any difficulty breathing or swallowing.  Over the last month since her last visit she reports she has continued to have the same amount of hives and swelling as before her initial visit.  She states 2 weeks ago she had significant facial swelling on her right side.  She states her facial swelling is typically on the same side. After her last visit she started taking zyrtec twice a day, pepcid twice a day without much relief of her hives or swelling.     Review of systems: Review of Systems  Constitutional: Negative for chills, fever and malaise/fatigue.  HENT: Negative for congestion, ear discharge, ear pain, nosebleeds, sinus pain, sore throat and tinnitus.   Eyes: Negative for discharge and redness.  Respiratory: Negative for cough, shortness of breath and wheezing.   Cardiovascular: Negative for chest pain.  Gastrointestinal: Negative for abdominal pain, heartburn, nausea and vomiting.  Musculoskeletal: Negative for joint pain and myalgias.  Skin: Positive for itching and rash.  Neurological: Positive for tremors (hands b/l). Negative for headaches.    All other systems negative unless noted above in HPI  Past medical/social/surgical/family history have been reviewed and are unchanged unless specifically indicated below.  No changes  Medication List: Allergies as of 05/23/2016      Reactions   Caffeine    Palpitations and pain   Iodine Swelling   Iohexol     Desc: FACIAL SWELLING   Penicillins Itching, Swelling   Shellfish Allergy Swelling   Sulfa Drugs Cross Reactors Nausea And Vomiting      Medication List       Accurate as of 05/23/16 12:03 PM. Always use your most recent med list.          cetirizine  10 MG tablet Commonly known as:  ZYRTEC Take 10 mg by mouth daily.   diphenhydrAMINE 25 MG tablet Commonly known as:  BENADRYL Take 25 mg by mouth every 6 (six) hours as needed.   famotidine 20 MG tablet Commonly known as:  PEPCID Take 20 mg by mouth at bedtime as needed for heartburn or indigestion.   montelukast 10 MG tablet Commonly known as:  SINGULAIR Take 1 tablet (10 mg total) by mouth at bedtime.       Known medication allergies: Allergies  Allergen Reactions  . Caffeine     Palpitations and pain   . Iodine Swelling  . Iohexol      Desc: FACIAL SWELLING   . Penicillins Itching and Swelling  . Shellfish Allergy Swelling  . Sulfa Drugs Cross Reactors Nausea And Vomiting     Physical examination: Blood pressure 110/68, pulse 72, resp. rate 14.  General: Alert, interactive, in no acute distress. HEENT: TMs pearly gray, turbinates non-edematous without discharge, post-pharynx non erythematous. Upper lip is visibly edematous Neck: Supple without lymphadenopathy. Lungs: Clear to auscultation without wheezing, rhonchi or rales. {no increased work of breathing. CV: Normal S1, S2 without murmurs. Abdomen: Nondistended, nontender. Skin: Warm and dry, without lesions or rashes. Extremities:  No clubbing, cyanosis or edema. Neuro:   Grossly intact.  Diagnositics/Labs: Labs:  Component     Latest Ref Rng & Units 04/22/2016  Shrimp IgE     kU/L <0.10  Crab  kU/L <0.10  Lobster     kU/L <0.10  Clams     kU/L <0.10   Component     Latest Ref Rng & Units 04/22/2016  TSH     mIU/L 1.83  Thyroperoxidase Ab SerPl-aCnc     <9 IU/mL <1  Thyroglobulin Ab     <2 IU/mL <1  Histamine Release     <16 % <16   Component     Latest Ref Rng & Units 04/22/2016  Tryptase     <11 ug/L 3.0   Component     Latest Ref Rng & Units 04/22/2016  Sodium     135 - 146 mmol/L 140  Potassium     3.5 - 5.3 mmol/L 4.6  Chloride     98 - 110 mmol/L 105  CO2     20 - 31  mmol/L 28  Glucose     65 - 99 mg/dL 80  BUN     7 - 25 mg/dL 14  Creatinine     1.61 - 0.99 mg/dL 0.96  Total Bilirubin     0.2 - 1.2 mg/dL 0.4  Alkaline Phosphatase     33 - 130 U/L 53  AST     10 - 35 U/L 19  ALT     6 - 29 U/L 24  Total Protein     6.1 - 8.1 g/dL 7.5  Albumin     3.6 - 5.1 g/dL 4.1  Calcium     8.6 - 10.4 mg/dL 9.8   Component     Latest Ref Rng & Units 04/22/2016  WBC     3.8 - 10.8 K/uL 7.3  RBC     3.80 - 5.10 MIL/uL 4.26  Hemoglobin     11.7 - 15.5 g/dL 04.5  HCT     40.9 - 81.1 % 37.8  MCV     80.0 - 100.0 fL 88.7  MCH     27.0 - 33.0 pg 29.1  MCHC     32.0 - 36.0 g/dL 91.4  RDW     78.2 - 95.6 % 15.2 (H)  Platelets     140 - 400 K/uL 255  MPV     7.5 - 12.5 fL 10.1  NEUT#     1,500 - 7,800 cells/uL 3,796  Lymphocyte #     850 - 3,900 cells/uL 2,628  Monocyte #     200 - 950 cells/uL 730  Eosinophils Absolute     15 - 500 cells/uL 146  Basophils Absolute     0 - 200 cells/uL 0  Neutrophils     % 52  Lymphocytes     % 36  Monocytes Relative     % 10  Eosinophil     % 2  Basophil     % 0   Assessment and plan: Chronic urticaria with angioedema    - at this time no identifiable trigger and likely represents idiopathic urticaria and angioedema.  Labwork was all reassuring without any underlying causes to her symptoms.      - resume use of Zyrtec 10mg  and Pepcid 20 mg twice a day.      - start singulair 10mg  daily    - We'll provide with prednisone 20 mg for the next 2 days to help decrease her lip swelling    - let us know in 2 weeks if your hives and swelling have not decreased in frequency and we will go ahead with  approval for Xolair monthly injection for management of hives and swelling.   Benefits and risk of Xolair discussed today as well as dosing schedule. She already ha [employer] which she was advised to bring on the days of her Xolair if we proceed with this treatment plan.   Food allergy   - shellfish panel was  negative   - recommend skin testing for shellfish to confirm your blood work (would skin test once you have been hive and swelling free for at least a month)   - continue avoidance at this time   - have access to [facility] and emergency plan  Follow-up for skin testing  I appreciate the opportunity to take part in Jelene's care. Please do not hesitate to contact me with questions.  Sincerely,   Margo Aye, MD Allergy/Immunology [facility]

## 2016-06-13 ENCOUNTER — Telehealth: Payer: Self-pay | Admitting: Allergy

## 2016-06-13 MED ORDER — FAMOTIDINE 20 MG PO TABS
20.0000 mg | ORAL_TABLET | Freq: Every evening | ORAL | 5 refills | Status: DC | PRN
Start: 2016-06-13 — End: 2016-06-28

## 2016-06-13 NOTE — Telephone Encounter (Signed)
Pt called and needs to have a rx of Pepcid called into

## 2016-06-13 NOTE — Telephone Encounter (Signed)
Pt called and needs to have rx of Pepcid called into [facility] in [place] .. [phone].

## 2016-06-14 ENCOUNTER — Ambulatory Visit: Payer: BC Managed Care – PPO | Admitting: Allergy

## 2016-06-28 ENCOUNTER — Ambulatory Visit (INDEPENDENT_AMBULATORY_CARE_PROVIDER_SITE_OTHER): Payer: BC Managed Care – PPO | Admitting: Allergy

## 2016-06-28 ENCOUNTER — Encounter: Payer: Self-pay | Admitting: Allergy

## 2016-06-28 VITALS — BP 118/62 | HR 78 | Temp 97.9°F

## 2016-06-28 DIAGNOSIS — T783XXD Angioneurotic edema, subsequent encounter: Secondary | ICD-10-CM | POA: Diagnosis not present

## 2016-06-28 DIAGNOSIS — Z91018 Allergy to other foods: Secondary | ICD-10-CM

## 2016-06-28 DIAGNOSIS — L509 Urticaria, unspecified: Secondary | ICD-10-CM | POA: Diagnosis not present

## 2016-06-28 MED ORDER — FAMOTIDINE 20 MG PO TABS: 20.0000 mg | ORAL_TABLET | Freq: Every evening | ORAL | 5 refills | Status: DC | PRN

## 2016-06-28 MED ORDER — MONTELUKAST SODIUM 10 MG PO TABS: 10.0000 mg | ORAL_TABLET | Freq: Every day | ORAL | 3 refills | Status: DC

## 2016-06-28 NOTE — Progress Notes (Signed)
Follow-up Note  RE: [name] [name] MRN: [id number] DOB: 1950-06-01 Date of Office Visit: 06/28/2016   History of present illness: [name] [name] is a 66 y.o. female presenting today for skin testing visit.  She has shellfish serum IgE testing today that was all negative.  Advised that she return for skin testing to determine eligibility for oral challenge.  She has held her antihistamines for past 3 days.  She has had recurrence of hives while off her antihistamines and had minimal lip swelling several days ago that has resolved.  She takes for hive maintenance zyrtec and pepcid twice a day and singulair.    Review of systems: Review of Systems  Constitutional: Negative for chills, fever and malaise/fatigue.  HENT: Negative for congestion, ear discharge, ear pain, nosebleeds, sinus pain, sore throat and tinnitus.   Eyes: Negative for discharge and redness.  Respiratory: Negative for cough, shortness of breath and wheezing.   Cardiovascular: Negative for chest pain.  Gastrointestinal: Negative for abdominal pain, heartburn, nausea and vomiting.  Skin: Positive for itching and rash.  Neurological: Positive for tremors. Negative for headaches.    All other systems negative unless noted above in HPI  Past medical/social/surgical/family history have been reviewed and are unchanged unless specifically indicated below.  No changes  Medication List: Allergies as of 06/28/2016      Reactions   Caffeine    Palpitations and pain   Iodine Swelling   Iohexol     Desc: FACIAL SWELLING   Penicillins Itching, Swelling   Shellfish Allergy Swelling   Sulfa Drugs Cross Reactors Nausea And Vomiting      Medication List       Accurate as of 06/28/16  3:14 PM. Always use your most recent med list.          cetirizine 10 MG tablet Commonly known as:  ZYRTEC Take 10 mg by mouth daily.   diphenhydrAMINE 25 MG tablet Commonly known as:  BENADRYL Take 25 mg by mouth every 6 (six) hours as  needed.   famotidine 20 MG tablet Commonly known as:  PEPCID Take 1 tablet (20 mg total) by mouth at bedtime as needed for heartburn or indigestion.   montelukast 10 MG tablet Commonly known as:  SINGULAIR Take 1 tablet (10 mg total) by mouth at bedtime.       Known medication allergies: Allergies  Allergen Reactions  . Caffeine     Palpitations and pain   . Iodine Swelling  . Iohexol      Desc: FACIAL SWELLING   . Penicillins Itching and Swelling  . Shellfish Allergy Swelling  . Sulfa Drugs Cross Reactors Nausea And Vomiting     Physical examination: Blood pressure 118/62, pulse 78, temperature 97.9 F (36.6 C), temperature source Oral, SpO2 97 %.  General: Alert, interactive, in no acute distress. HEENT: TMs pearly gray, turbinates minimally edematous without discharge, post-pharynx non erythematous. Neck: Supple without lymphadenopathy. Lungs: Clear to auscultation without wheezing, rhonchi or rales. {no increased work of breathing. CV: Normal S1, S2 without murmurs. Abdomen: Nondistended, nontender. Skin: Scattered erythematous urticarial type lesions primarily located left inner arm , nonvesicular. Extremities:  No clubbing, cyanosis or edema. Neuro:   Grossly intact.  Diagnositics/Labs:  Allergy testing:  Environmental allergen skin prick testing was positive for dust mites.   Shellfish panel skin prick testing was negative.    Allergy testing results were read and interpreted by provider, documented by clinical staff.   Assessment and plan:  Food allergy   - shellfish IgE panel was negative   - shellfish skin testing today is also negative   - continue avoidance at this time until able to perform in-office oral challenge.  Would recommend challenging to shrimp first.   You will need to hold your antihistamines for in-office challenges thus would recommend proceeding with this when you are hive/swelling free.     - have access to [facility] and emergency  plan  Chronic urticaria with angioedema    - your labwork done previously was all reassuring and normal.     - environmental allergen skin testing today was positive for dust mites.  Allergen avoidance measures discussed and handouts provided.       - continue use of Zyrtec 10mg  and Pepcid 20 mg twice a day.      - continue singulair 10mg  daily    - discussed Xolair for control of urticaria again today including benefit and risk and she will continue to think on this option and let us know when/if she would like to start     Follow-up for oral challenge or in 4-6 months   I appreciate the opportunity to take part in [name] [name]'s care. Please do not hesitate to contact me with questions.  Sincerely,   Margo Aye, MD Allergy/Immunology [facility]

## 2016-06-28 NOTE — Patient Instructions (Addendum)
Food allergy   - shellfish IgE panel was negative   - shellfish skin testing today is also negative   - continue avoidance at this time until able to perform in-office oral challenge.  Would recommend challenging to shrimp first.   You will need to hold your antihistamines for in-office challenges thus would recommend proceeding with this when you are hive/swelling free.     - have access to [facility] and emergency plan  Swelling and Hives     - your labwork done previously was all reassuring and normal.     - environmental allergen skin testing was positive for dust mites.  Allergen avoidance measures discussed and handouts provided.       - continue use of Zyrtec 10mg  and Pepcid 20 mg twice a day.      - continue singulair 10mg  daily    - discussed Xolair for control of urticaria again today including benefit and risk and she will continue to think on this option and let us know when/if she would like to start        Follow-up for oral challenge or in 4-6 months

## 2016-09-17 ENCOUNTER — Telehealth: Payer: Self-pay | Admitting: Adult Health

## 2016-09-17 NOTE — Telephone Encounter (Signed)
Pt was last seen by Dr Anne HahnWillis 02/22/16 for parkinson's. She said her sister died and she forgot about a medication Dr Lacretia NicksW had prescribed for her. She is wanting to discuss. Her appt is with Aundra MilletMegan tomorrow at 2

## 2016-09-17 NOTE — Telephone Encounter (Signed)
[name] PancoastMary [name] she is wanting a call back today or wants to talk about it tomorrow ?

## 2016-09-18 ENCOUNTER — Encounter: Payer: BC Managed Care – PPO | Admitting: Adult Health

## 2016-09-18 ENCOUNTER — Telehealth: Payer: Self-pay | Admitting: *Deleted

## 2016-09-18 MED ORDER — PRAMIPEXOLE DIHYDROCHLORIDE 0.125 MG PO TABS: ORAL_TABLET | ORAL | 1 refills | Status: DC

## 2016-09-18 NOTE — Telephone Encounter (Signed)
Pt. forgot to start Mirapex after last appt. with Dr. Anne HahnWillis.  She will start it now and [facility]ahnWillis in 2 mos.  [facility] [place]

## 2016-09-19 NOTE — Progress Notes (Signed)
This encounter was created in error - please disregard.

## 2017-01-01 ENCOUNTER — Ambulatory Visit: Payer: BC Managed Care – PPO | Admitting: Adult Health

## 2017-02-11 ENCOUNTER — Other Ambulatory Visit: Payer: Self-pay | Admitting: Allergy and Immunology

## 2017-02-11 DIAGNOSIS — T783XXD Angioneurotic edema, subsequent encounter: Secondary | ICD-10-CM

## 2017-02-11 DIAGNOSIS — L509 Urticaria, unspecified: Secondary | ICD-10-CM

## 2017-05-28 ENCOUNTER — Other Ambulatory Visit: Payer: Self-pay | Admitting: Allergy

## 2017-05-28 DIAGNOSIS — L509 Urticaria, unspecified: Secondary | ICD-10-CM

## 2017-05-28 DIAGNOSIS — T783XXD Angioneurotic edema, subsequent encounter: Secondary | ICD-10-CM

## 2017-10-06 DIAGNOSIS — R69 Illness, unspecified: Secondary | ICD-10-CM | POA: Diagnosis not present

## 2017-10-08 DIAGNOSIS — R69 Illness, unspecified: Secondary | ICD-10-CM | POA: Diagnosis not present

## 2018-01-30 DIAGNOSIS — G8190 Hemiplegia, unspecified affecting unspecified side: Secondary | ICD-10-CM | POA: Diagnosis not present

## 2018-01-30 DIAGNOSIS — Z6828 Body mass index (BMI) 28.0-28.9, adult: Secondary | ICD-10-CM | POA: Diagnosis not present

## 2018-02-09 DIAGNOSIS — R531 Weakness: Secondary | ICD-10-CM | POA: Diagnosis not present

## 2018-02-09 DIAGNOSIS — G8194 Hemiplegia, unspecified affecting left nondominant side: Secondary | ICD-10-CM | POA: Diagnosis not present

## 2018-02-09 DIAGNOSIS — G8190 Hemiplegia, unspecified affecting unspecified side: Secondary | ICD-10-CM | POA: Diagnosis not present

## 2018-02-09 DIAGNOSIS — G20 Parkinson's disease: Secondary | ICD-10-CM | POA: Diagnosis not present

## 2018-02-23 DIAGNOSIS — M545 Low back pain: Secondary | ICD-10-CM | POA: Diagnosis not present

## 2018-02-23 DIAGNOSIS — Z6827 Body mass index (BMI) 27.0-27.9, adult: Secondary | ICD-10-CM | POA: Diagnosis not present

## 2018-03-04 DIAGNOSIS — R262 Difficulty in walking, not elsewhere classified: Secondary | ICD-10-CM | POA: Diagnosis not present

## 2018-03-04 DIAGNOSIS — M545 Low back pain: Secondary | ICD-10-CM | POA: Diagnosis not present

## 2018-03-04 DIAGNOSIS — M5416 Radiculopathy, lumbar region: Secondary | ICD-10-CM | POA: Diagnosis not present

## 2018-03-06 DIAGNOSIS — R262 Difficulty in walking, not elsewhere classified: Secondary | ICD-10-CM | POA: Diagnosis not present

## 2018-03-06 DIAGNOSIS — M545 Low back pain: Secondary | ICD-10-CM | POA: Diagnosis not present

## 2018-03-06 DIAGNOSIS — M5416 Radiculopathy, lumbar region: Secondary | ICD-10-CM | POA: Diagnosis not present

## 2018-03-09 DIAGNOSIS — M5416 Radiculopathy, lumbar region: Secondary | ICD-10-CM | POA: Diagnosis not present

## 2018-03-09 DIAGNOSIS — M545 Low back pain: Secondary | ICD-10-CM | POA: Diagnosis not present

## 2018-03-09 DIAGNOSIS — R262 Difficulty in walking, not elsewhere classified: Secondary | ICD-10-CM | POA: Diagnosis not present

## 2018-03-11 DIAGNOSIS — M545 Low back pain: Secondary | ICD-10-CM | POA: Diagnosis not present

## 2018-03-11 DIAGNOSIS — R262 Difficulty in walking, not elsewhere classified: Secondary | ICD-10-CM | POA: Diagnosis not present

## 2018-03-11 DIAGNOSIS — M5416 Radiculopathy, lumbar region: Secondary | ICD-10-CM | POA: Diagnosis not present

## 2018-03-17 DIAGNOSIS — M5416 Radiculopathy, lumbar region: Secondary | ICD-10-CM | POA: Diagnosis not present

## 2018-03-17 DIAGNOSIS — R262 Difficulty in walking, not elsewhere classified: Secondary | ICD-10-CM | POA: Diagnosis not present

## 2018-03-17 DIAGNOSIS — M545 Low back pain: Secondary | ICD-10-CM | POA: Diagnosis not present

## 2018-03-18 DIAGNOSIS — M545 Low back pain: Secondary | ICD-10-CM | POA: Diagnosis not present

## 2018-03-18 DIAGNOSIS — R262 Difficulty in walking, not elsewhere classified: Secondary | ICD-10-CM | POA: Diagnosis not present

## 2018-03-18 DIAGNOSIS — M5416 Radiculopathy, lumbar region: Secondary | ICD-10-CM | POA: Diagnosis not present

## 2018-03-23 DIAGNOSIS — R262 Difficulty in walking, not elsewhere classified: Secondary | ICD-10-CM | POA: Diagnosis not present

## 2018-03-23 DIAGNOSIS — M545 Low back pain: Secondary | ICD-10-CM | POA: Diagnosis not present

## 2018-03-23 DIAGNOSIS — M5416 Radiculopathy, lumbar region: Secondary | ICD-10-CM | POA: Diagnosis not present

## 2018-03-25 DIAGNOSIS — R262 Difficulty in walking, not elsewhere classified: Secondary | ICD-10-CM | POA: Diagnosis not present

## 2018-03-25 DIAGNOSIS — M5416 Radiculopathy, lumbar region: Secondary | ICD-10-CM | POA: Diagnosis not present

## 2018-03-25 DIAGNOSIS — M545 Low back pain: Secondary | ICD-10-CM | POA: Diagnosis not present

## 2018-03-30 DIAGNOSIS — R262 Difficulty in walking, not elsewhere classified: Secondary | ICD-10-CM | POA: Diagnosis not present

## 2018-03-30 DIAGNOSIS — M545 Low back pain: Secondary | ICD-10-CM | POA: Diagnosis not present

## 2018-03-30 DIAGNOSIS — M5416 Radiculopathy, lumbar region: Secondary | ICD-10-CM | POA: Diagnosis not present

## 2018-04-01 DIAGNOSIS — M545 Low back pain: Secondary | ICD-10-CM | POA: Diagnosis not present

## 2018-04-01 DIAGNOSIS — M5416 Radiculopathy, lumbar region: Secondary | ICD-10-CM | POA: Diagnosis not present

## 2018-04-01 DIAGNOSIS — R262 Difficulty in walking, not elsewhere classified: Secondary | ICD-10-CM | POA: Diagnosis not present

## 2018-04-06 DIAGNOSIS — M5416 Radiculopathy, lumbar region: Secondary | ICD-10-CM | POA: Diagnosis not present

## 2018-04-06 DIAGNOSIS — R262 Difficulty in walking, not elsewhere classified: Secondary | ICD-10-CM | POA: Diagnosis not present

## 2018-04-06 DIAGNOSIS — M545 Low back pain: Secondary | ICD-10-CM | POA: Diagnosis not present

## 2018-04-08 DIAGNOSIS — M545 Low back pain: Secondary | ICD-10-CM | POA: Diagnosis not present

## 2018-04-08 DIAGNOSIS — R69 Illness, unspecified: Secondary | ICD-10-CM | POA: Diagnosis not present

## 2018-04-08 DIAGNOSIS — R262 Difficulty in walking, not elsewhere classified: Secondary | ICD-10-CM | POA: Diagnosis not present

## 2018-04-08 DIAGNOSIS — M5416 Radiculopathy, lumbar region: Secondary | ICD-10-CM | POA: Diagnosis not present

## 2018-05-01 DIAGNOSIS — M47816 Spondylosis without myelopathy or radiculopathy, lumbar region: Secondary | ICD-10-CM | POA: Diagnosis not present

## 2018-05-01 DIAGNOSIS — M5136 Other intervertebral disc degeneration, lumbar region: Secondary | ICD-10-CM | POA: Diagnosis not present

## 2018-05-01 DIAGNOSIS — M545 Low back pain: Secondary | ICD-10-CM | POA: Diagnosis not present

## 2018-07-06 DIAGNOSIS — M4727 Other spondylosis with radiculopathy, lumbosacral region: Secondary | ICD-10-CM | POA: Diagnosis not present

## 2018-07-09 DIAGNOSIS — R200 Anesthesia of skin: Secondary | ICD-10-CM | POA: Diagnosis not present

## 2018-07-09 DIAGNOSIS — R030 Elevated blood-pressure reading, without diagnosis of hypertension: Secondary | ICD-10-CM | POA: Diagnosis not present

## 2018-07-09 DIAGNOSIS — M48062 Spinal stenosis, lumbar region with neurogenic claudication: Secondary | ICD-10-CM | POA: Diagnosis not present

## 2018-07-09 DIAGNOSIS — M79605 Pain in left leg: Secondary | ICD-10-CM | POA: Diagnosis not present

## 2018-07-09 DIAGNOSIS — Z6828 Body mass index (BMI) 28.0-28.9, adult: Secondary | ICD-10-CM | POA: Diagnosis not present

## 2018-07-15 DIAGNOSIS — R030 Elevated blood-pressure reading, without diagnosis of hypertension: Secondary | ICD-10-CM | POA: Diagnosis not present

## 2018-07-15 DIAGNOSIS — Z6829 Body mass index (BMI) 29.0-29.9, adult: Secondary | ICD-10-CM | POA: Diagnosis not present

## 2018-07-15 DIAGNOSIS — M48062 Spinal stenosis, lumbar region with neurogenic claudication: Secondary | ICD-10-CM | POA: Diagnosis not present

## 2018-08-05 DIAGNOSIS — Z6827 Body mass index (BMI) 27.0-27.9, adult: Secondary | ICD-10-CM | POA: Diagnosis not present

## 2018-08-05 DIAGNOSIS — Z01419 Encounter for gynecological examination (general) (routine) without abnormal findings: Secondary | ICD-10-CM | POA: Diagnosis not present

## 2018-08-10 DIAGNOSIS — Z1231 Encounter for screening mammogram for malignant neoplasm of breast: Secondary | ICD-10-CM | POA: Diagnosis not present

## 2018-08-25 DIAGNOSIS — Z6827 Body mass index (BMI) 27.0-27.9, adult: Secondary | ICD-10-CM | POA: Diagnosis not present

## 2018-08-25 DIAGNOSIS — M5430 Sciatica, unspecified side: Secondary | ICD-10-CM | POA: Diagnosis not present

## 2018-09-16 DIAGNOSIS — M5430 Sciatica, unspecified side: Secondary | ICD-10-CM | POA: Diagnosis not present

## 2018-10-06 DIAGNOSIS — M48062 Spinal stenosis, lumbar region with neurogenic claudication: Secondary | ICD-10-CM | POA: Diagnosis not present

## 2018-10-21 DIAGNOSIS — T783XXA Angioneurotic edema, initial encounter: Secondary | ICD-10-CM | POA: Diagnosis not present

## 2018-10-21 DIAGNOSIS — Z6827 Body mass index (BMI) 27.0-27.9, adult: Secondary | ICD-10-CM | POA: Diagnosis not present

## 2018-10-21 DIAGNOSIS — G20 Parkinson's disease: Secondary | ICD-10-CM | POA: Diagnosis not present

## 2018-11-17 DIAGNOSIS — M48062 Spinal stenosis, lumbar region with neurogenic claudication: Secondary | ICD-10-CM | POA: Diagnosis not present

## 2018-11-25 DIAGNOSIS — M1711 Unilateral primary osteoarthritis, right knee: Secondary | ICD-10-CM | POA: Diagnosis not present

## 2018-11-25 DIAGNOSIS — E785 Hyperlipidemia, unspecified: Secondary | ICD-10-CM | POA: Diagnosis not present

## 2018-11-25 DIAGNOSIS — B350 Tinea barbae and tinea capitis: Secondary | ICD-10-CM | POA: Diagnosis not present

## 2019-01-11 DIAGNOSIS — Z23 Encounter for immunization: Secondary | ICD-10-CM | POA: Diagnosis not present

## 2019-01-14 DIAGNOSIS — Z0001 Encounter for general adult medical examination with abnormal findings: Secondary | ICD-10-CM | POA: Diagnosis not present

## 2019-01-18 DIAGNOSIS — E782 Mixed hyperlipidemia: Secondary | ICD-10-CM | POA: Diagnosis not present

## 2019-01-18 DIAGNOSIS — Z0000 Encounter for general adult medical examination without abnormal findings: Secondary | ICD-10-CM | POA: Diagnosis not present

## 2019-01-18 DIAGNOSIS — Z23 Encounter for immunization: Secondary | ICD-10-CM | POA: Diagnosis not present

## 2019-01-18 DIAGNOSIS — Z6826 Body mass index (BMI) 26.0-26.9, adult: Secondary | ICD-10-CM | POA: Diagnosis not present

## 2019-01-18 DIAGNOSIS — G20 Parkinson's disease: Secondary | ICD-10-CM | POA: Diagnosis not present

## 2019-01-18 DIAGNOSIS — M5432 Sciatica, left side: Secondary | ICD-10-CM | POA: Diagnosis not present

## 2019-01-25 DIAGNOSIS — I10 Essential (primary) hypertension: Secondary | ICD-10-CM | POA: Diagnosis not present

## 2019-01-25 DIAGNOSIS — E782 Mixed hyperlipidemia: Secondary | ICD-10-CM | POA: Diagnosis not present

## 2019-01-26 DIAGNOSIS — R269 Unspecified abnormalities of gait and mobility: Secondary | ICD-10-CM | POA: Diagnosis not present

## 2019-01-26 DIAGNOSIS — M5432 Sciatica, left side: Secondary | ICD-10-CM | POA: Diagnosis not present

## 2019-02-02 DIAGNOSIS — R269 Unspecified abnormalities of gait and mobility: Secondary | ICD-10-CM | POA: Diagnosis not present

## 2019-02-02 DIAGNOSIS — M5432 Sciatica, left side: Secondary | ICD-10-CM | POA: Diagnosis not present

## 2019-02-09 DIAGNOSIS — M5432 Sciatica, left side: Secondary | ICD-10-CM | POA: Diagnosis not present

## 2019-02-09 DIAGNOSIS — R269 Unspecified abnormalities of gait and mobility: Secondary | ICD-10-CM | POA: Diagnosis not present

## 2019-02-16 DIAGNOSIS — M5432 Sciatica, left side: Secondary | ICD-10-CM | POA: Diagnosis not present

## 2019-02-16 DIAGNOSIS — R269 Unspecified abnormalities of gait and mobility: Secondary | ICD-10-CM | POA: Diagnosis not present

## 2019-02-25 DIAGNOSIS — G20 Parkinson's disease: Secondary | ICD-10-CM | POA: Diagnosis not present

## 2019-02-25 DIAGNOSIS — M5432 Sciatica, left side: Secondary | ICD-10-CM | POA: Diagnosis not present

## 2019-03-03 ENCOUNTER — Emergency Department (HOSPITAL_COMMUNITY): Payer: Medicare HMO

## 2019-03-03 ENCOUNTER — Other Ambulatory Visit: Payer: Self-pay

## 2019-03-03 ENCOUNTER — Encounter (HOSPITAL_COMMUNITY): Payer: Self-pay

## 2019-03-03 ENCOUNTER — Emergency Department (HOSPITAL_COMMUNITY)
Admission: EM | Admit: 2019-03-03 | Discharge: 2019-03-03 | Discharge status: Against Medical Advice | Payer: Medicare HMO | Attending: Emergency Medicine | Admitting: Emergency Medicine

## 2019-03-03 DIAGNOSIS — Z79899 Other long term (current) drug therapy: Secondary | ICD-10-CM | POA: Insufficient documentation

## 2019-03-03 DIAGNOSIS — R0789 Other chest pain: Secondary | ICD-10-CM | POA: Diagnosis not present

## 2019-03-03 DIAGNOSIS — R079 Chest pain, unspecified: Secondary | ICD-10-CM | POA: Diagnosis not present

## 2019-03-03 LAB — HEPATIC FUNCTION PANEL
ALT: 59 U/L — ABNORMAL HIGH (ref 0–44)
AST: 27 U/L (ref 15–41)
Albumin: 4.6 g/dL (ref 3.5–5.0)
Alkaline Phosphatase: 57 U/L (ref 38–126)
Bilirubin, Direct: <0.1 mg/dL (ref 0.0–0.2)
Total Bilirubin: 0.9 mg/dL (ref 0.3–1.2)
Total Protein: 8.5 g/dL — ABNORMAL HIGH (ref 6.5–8.1)

## 2019-03-03 LAB — BASIC METABOLIC PANEL
Anion gap: 12 (ref 5–15)
BUN: 15 mg/dL (ref 8–23)
CO2: 24 mmol/L (ref 22–32)
Calcium: 9.4 mg/dL (ref 8.9–10.3)
Chloride: 97 mmol/L — ABNORMAL LOW (ref 98–111)
Creatinine, Ser: 0.62 mg/dL (ref 0.44–1.00)
GFR calc Af Amer: >60 mL/min (ref >60)
GFR calc non Af Amer: >60 mL/min (ref >60)
Glucose, Bld: 112 mg/dL — ABNORMAL HIGH (ref 70–99)
Potassium: 4.1 mmol/L (ref 3.5–5.1)
Sodium: 133 mmol/L — ABNORMAL LOW (ref 135–145)

## 2019-03-03 LAB — CBC
HCT: 38 % (ref 36.0–46.0)
Hemoglobin: 12.4 g/dL (ref 12.0–15.0)
MCH: 30.4 pg (ref 26.0–34.0)
MCHC: 32.6 g/dL (ref 30.0–36.0)
MCV: 93.1 fL (ref 80.0–100.0)
Platelets: 288 10*3/uL (ref 150–400)
RBC: 4.08 MIL/uL (ref 3.87–5.11)
RDW: 15.1 % (ref 11.5–15.5)
WBC: 9 10*3/uL (ref 4.0–10.5)
nRBC: 0 % (ref 0.0–0.2)

## 2019-03-03 LAB — LIPASE, BLOOD: Lipase: 15 U/L (ref 11–51)

## 2019-03-03 LAB — TROPONIN I (HIGH SENSITIVITY): Troponin I (High Sensitivity): 3 ng/L (ref <18)

## 2019-03-03 MED ORDER — ONDANSETRON HCL 4 MG/2ML IJ SOLN
4.0000 mg | Freq: Once | INTRAMUSCULAR | Status: AC
  Administered 2019-03-03: 4 mg via INTRAVENOUS
  Filled 2019-03-03: qty 2

## 2019-03-03 MED ORDER — FENTANYL CITRATE (PF) 100 MCG/2ML IJ SOLN
100.0000 ug | Freq: Once | INTRAMUSCULAR | Status: AC
  Administered 2019-03-03: 100 ug via INTRAVENOUS
  Filled 2019-03-03: qty 2

## 2019-03-03 MED ORDER — SODIUM CHLORIDE 0.9% FLUSH
3.0000 mL | Freq: Once | INTRAVENOUS | Status: AC
  Administered 2019-03-03: 3 mL via INTRAVENOUS

## 2019-03-03 NOTE — ED Provider Notes (Signed)
[facility]-EMERGENCY DEPT Provider Note   CSN: [id number] Arrival date & time: 03/03/19  0351     History Chief Complaint  Patient presents with  . Chest Pain    Raul P Backs is a 69 y.o. female.  HPI  HPI: A 69 year old patient presents for evaluation of chest pain. Initial onset of pain was approximately 1-3 hours ago. The patient's chest pain is described as heaviness/pressure/tightness and is not worse with exertion. The patient complains of nausea. The patient's chest pain is middle- or left-sided, is not well-localized, is not sharp and does not radiate to the arms/jaw/neck. The patient denies diaphoresis. The patient has a family history of coronary artery disease in a first-degree relative with onset less than age 12. The patient has no history of stroke, has no history of peripheral artery disease, has not smoked in the past 90 days, denies any history of treated diabetes, is not hypertensive, has no history of hypercholesterolemia and does not have an elevated BMI (>=30).  Patient with history of Parkinson's presents with chest tightness and nausea.  Patient reports she was awake tonight due to pain in her back and legs which is chronic, when she began having chest tightness and feeling nausea.  No fevers or vomiting She has not had this pain before.  No pleuritic pain.  No new cough.  No shortness of breath.  Denies history of CAD/PE. Her course is stable.  Movement in the bed seems to worsen her symptoms No recent trauma. Past Medical History:  Diagnosis Date  . Bursitis   . Cellulitis    arm  . Headache   . Hyperlipidemia   . Insomnia   . OA (osteoarthritis)   . Onychomycosis   . Osteoarthritis   . Parkinson disease (HCC) 03/23/2015  . RLS (restless legs syndrome) 03/23/2015  . Tenosynovitis     Patient Active Problem List   Diagnosis Date Noted  . Upper airway cough syndrome 05/03/2015  . RLS (restless legs syndrome) 03/23/2015  . Parkinson  disease (HCC) 03/23/2015  . Fever 08/29/2011  . Headache(784.0) 08/29/2011  . Knee pain, right 08/29/2011  . Back pain 08/29/2011  . Bacteremia due to Gram-positive bacteria 08/29/2011  . Hepatitis 08/29/2011  . Periorbital edema 08/29/2011  . Facial rash 08/29/2011  . Blurred vision, right eye 08/29/2011  . Tremor 08/29/2011  . Meningitis 08/29/2011  . CHEST PAIN UNSPECIFIED 03/04/2007  . PNEUMONIA, LEFT LOWER LOBE 03/03/2007  . COUGH, CHRONIC 03/03/2007    Past Surgical History:  Procedure Laterality Date  . TUBAL LIGATION       OB History   No obstetric history on file.     Family History  Problem Relation Age of Onset  . Diabetes type II Sister   . Asthma Sister   . Heart failure Mother   . Stroke Mother   . Prostate cancer Father   . Diabetes type II Brother   . Tremor Sister   . Diabetes type II Brother   . Cancer Brother        prostate  . Cancer Brother        prostate  . Cancer Brother        prostate  . Allergic rhinitis Neg Hx   . Angioedema Neg Hx   . Atopy Neg Hx   . Eczema Neg Hx   . Immunodeficiency Neg Hx   . Urticaria Neg Hx     Social History   Tobacco Use  . Smoking  status: Never Smoker  . Smokeless tobacco: Never Used  Substance Use Topics  . Alcohol use: No  . Drug use: No    Home Medications Prior to Admission medications   Medication Sig Start Date End Date Taking? Authorizing Provider  acetaminophen (TYLENOL) 650 MG CR tablet Take 650 mg by mouth every 8 (eight) hours as needed for pain.   Yes [provider]  acidophilus (RISAQUAD) CAPS capsule Take 1 capsule by mouth daily.   Yes [provider]  Ascorbic Acid (VITAMIN C) 100 MG tablet Take 100 mg by mouth daily.   Yes [provider]  calcium carbonate 1250 MG capsule Take 1,250 mg by mouth 2 (two) times daily with a meal.   Yes [provider]  cetirizine (ZYRTEC) 10 MG tablet Take 10 mg by mouth daily.   Yes [provider]    ferrous sulfate 325 (65 FE) MG tablet Take 325 mg by mouth daily with breakfast.   Yes [provider]  Omega-3 Fatty Acids (FISH OIL) 1200 MG CAPS Take by mouth.   Yes [provider]  potassium chloride SA (K-DUR,KLOR-CON) 20 MEQ tablet Take 20 mEq by mouth daily.    Yes [provider]  vitamin B-12 (CYANOCOBALAMIN) 100 MCG tablet Take 100 mcg by mouth daily.   Yes [provider]  zinc gluconate 50 MG tablet Take 50 mg by mouth daily.   Yes [provider]  famotidine (PEPCID) 20 MG tablet TAKE 1 TABLET BY MOUTH TWICE DAILY Patient not taking: Reported on 03/03/2019 02/11/17   Kennith Gain, MD  montelukast (SINGULAIR) 10 MG tablet Take 1 tablet (10 mg total) by mouth at bedtime. Patient not taking: Reported on 03/03/2019 06/28/16   Kennith Gain, MD  pramipexole (MIRAPEX) 0.125 MG tablet Take 1 tablet 3 times daily for 3 weeks, then take 2 tablets 3 times daily for 3 weeks, then take 3 tablets 3 times daily. Patient not taking: Reported on 03/03/2019 09/18/16   [name], MD    Allergies    Caffeine, Iodine, Iohexol, Penicillins, Shellfish allergy, and Sulfa drugs cross reactors  Review of Systems   Review of Systems  Constitutional: Negative for fever.  Respiratory: Negative for shortness of breath.   Cardiovascular: Positive for chest pain.  Gastrointestinal: Positive for nausea.  Neurological: Positive for tremors and headaches.  All other systems reviewed and are negative.   Physical Exam Updated Vital Signs BP (!) 175/95 (BP Location: Right Arm)   Pulse 97   Temp 97.6 F (36.4 C) (Oral)   Resp 18   Ht 1.676 m (5\' 6" )   Wt 77.1 kg   SpO2 97%   BMI 27.44 kg/m   Physical Exam CONSTITUTIONAL: Chronically ill-appearing HEAD: Normocephalic/atraumatic EYES: EOMI/PERRL ENMT: Mucous membranes moist NECK: supple no meningeal signs SPINE/BACK:entire spine nontender CV: S1/S2 noted, no  murmurs/rubs/gallops noted Chest-no chest wall tenderness or crepitus LUNGS: Lungs are clear to auscultation bilaterally, no apparent distress ABDOMEN: soft, nontender, no rebound or guarding, bowel sounds noted throughout abdomen GU:no cva tenderness NEURO: Pt is awake/alert/appropriate, moves all extremitiesx4.  No facial droop.  Tremor is noted in her upper extremities EXTREMITIES: pulses normal/equal x4, full ROM, no calf tenderness or edema in her legs.  No deformities. SKIN: warm, color normal PSYCH: no abnormalities of mood noted, alert and oriented to situation  ED Results / Procedures / Treatments   Labs (all labs ordered are listed, but only abnormal results are displayed) Labs Reviewed  BASIC METABOLIC PANEL - Abnormal; Notable for the following components:      Result Value   Sodium 133 (*)    Chloride 97 (*)    Glucose, Bld 112 (*)    All other components within normal limits  CBC  HEPATIC FUNCTION PANEL  LIPASE, BLOOD  TROPONIN I (HIGH SENSITIVITY)  TROPONIN I (HIGH SENSITIVITY)    EKG EKG Interpretation  Date/Time:  Wednesday March 03 2019 04:38:29 EST Ventricular Rate:  96 PR Interval:    QRS Duration: 84 QT Interval:  392 QTC Calculation: 496 R Axis:   17 Text Interpretation: Sinus rhythm Nonspecific T abnrm, anterolateral leads Borderline prolonged QT interval Interpretation limited secondary to artifact No previous ECGs available Abnormal ekg Confirmed by Zadie Rhine ([id number]) on 03/03/2019 5:01:06 AM   Radiology No results found.  Procedures Procedures    Medications Ordered in ED Medications  sodium chloride flush (NS) 0.9 % injection 3 mL (3 mLs Intravenous Given 03/03/19 0631)  ondansetron (ZOFRAN) injection 4 mg (4 mg Intravenous Given 03/03/19 0628)  fentaNYL (SUBLIMAZE) injection 100 mcg (100 mcg Intravenous Given 03/03/19 0630)    ED Course  I have reviewed the triage vital signs and the nursing notes.  Pertinent labs & imaging results  that were available during my care of the patient were reviewed by me and considered in my medical decision making (see chart for details).    MDM Rules/Calculators/A&P HEAR Score: 5                    6:42 AM Patient presents due to having nausea and chest tightness.  She also reports having recent back and leg pain which are chronic, she is also developing a headache.  Denies any history of diabetes or hypertension, but does report a family history of CAD. No known history of CAD/PE Initial heart score 5 and troponin is unremarkable.  EKG does show inverted T waves but no old to compare, but limited due to artifact for her tremor.   Plan to continue monitoring 7:05 AM Pt feeling improved Insists now it was mostly nausea Plan to recheck troponin/CXR/LFTs If negative she can be discharged Signed out to dr Juleen China at 7am Final Clinical Impression(s) / ED Diagnoses Final diagnoses:  None    Rx / DC Orders ED Discharge Orders    None       Zadie Rhine, MD 03/03/19 [phone]

## 2019-03-03 NOTE — ED Notes (Signed)
Pt requesting to leave. MD placed pt up AMA. Pt understands the risk of leaving, AMA form signed in Epic. Pt has no further questions at this time.

## 2019-03-03 NOTE — ED Triage Notes (Signed)
Pt complains of centralized chest pain and feeling very nauseated Pt has Parkinsons and has trouble sitting for long periods

## 2019-03-03 NOTE — ED Notes (Signed)
Contacted lab to add on labs.  

## 2019-03-03 NOTE — ED Notes (Signed)
Pt transported to xray 

## 2019-03-03 NOTE — ED Notes (Signed)
Pt placed on 2L n/c for comfort after pain medication administration

## 2019-03-22 ENCOUNTER — Ambulatory Visit: Payer: BC Managed Care – PPO | Attending: Internal Medicine

## 2019-03-22 ENCOUNTER — Other Ambulatory Visit: Payer: Self-pay

## 2019-03-22 DIAGNOSIS — Z20822 Contact with and (suspected) exposure to covid-19: Secondary | ICD-10-CM | POA: Diagnosis not present

## 2019-03-23 LAB — NOVEL CORONAVIRUS, NAA: SARS-CoV-2, NAA: NOT DETECTED

## 2019-03-24 ENCOUNTER — Ambulatory Visit: Payer: Self-pay

## 2019-03-24 NOTE — Telephone Encounter (Signed)
Provided Patient with covid -19 results.  Voiced understanding.

## 2019-03-26 DIAGNOSIS — I10 Essential (primary) hypertension: Secondary | ICD-10-CM | POA: Diagnosis not present

## 2019-03-26 DIAGNOSIS — G20 Parkinson's disease: Secondary | ICD-10-CM | POA: Diagnosis not present

## 2019-03-31 ENCOUNTER — Encounter: Payer: Self-pay | Admitting: Neurology

## 2019-03-31 ENCOUNTER — Ambulatory Visit (INDEPENDENT_AMBULATORY_CARE_PROVIDER_SITE_OTHER): Payer: Medicare HMO | Admitting: Neurology

## 2019-03-31 ENCOUNTER — Other Ambulatory Visit: Payer: Self-pay

## 2019-03-31 VITALS — BP 147/92 | HR 82 | Temp 97.5°F | Ht 66.0 in | Wt 177.0 lb

## 2019-03-31 DIAGNOSIS — M21372 Foot drop, left foot: Secondary | ICD-10-CM

## 2019-03-31 DIAGNOSIS — G20 Parkinson's disease: Secondary | ICD-10-CM

## 2019-03-31 MED ORDER — CARBIDOPA-LEVODOPA 25-100 MG PO TABS: ORAL_TABLET | ORAL | 3 refills | Status: DC

## 2019-03-31 NOTE — Patient Instructions (Signed)
We will start sinemet for the parkinson's disease.  Sinemet (carbidopa) may result in confusion or hallucinations, drowsiness, nausea, or dizziness. If any significant side effects are noted, please contact our office. Sinemet may not be well absorbed when taken with high protein meals, if tolerated it is best to take 30-45 minutes before you eat.  

## 2019-03-31 NOTE — Progress Notes (Signed)
Reason for visit: Parkinson's disease, left foot drop  Referring physician: Dr. [name] is a 69 y.o. female  History of present illness:  [name] [name] is a 69 year old right-handed black female who was diagnosed with Parkinson's disease in 2017.  The patient has been lost to follow-up, she had been placed on Mirapex at that time but never took the medication for fear of potential side effects.  The patient has continued to progress over time, she has had worsening of tremor in the left upper extremity and is now developed a tremor in the left lower extremity.  She is feeling a slight tremor in the right arm as well.  She is having increased problems with mobility, she has difficulty getting up from a chair and walking.  She was evaluated by Dr. [name] in May 2020 for some left leg pain, she had MRI of the lumbosacral spine in March 2020 at [facility], the results were not available to me.  She was told that she had a pinched nerve in the back.  The patient has not had any discomfort in the left leg but within the last 2 or 3 weeks she has developed numbness in the left foot and some weakness.  She has swelling at the ankle on the left.  The patient has not had any falls, she does not use a cane for ambulation.  She notes problems with constipation but she denies issues controlling the bladder.  The patient is having some problems with anxiety, she has insomnia at night.  She denies any further problems with restless leg syndrome.  She has no problems with swallowing, she denies any drooling problems.  She does report some generalized fatigue.  She denies any memory problems.  She is sent to this office for further evaluation of her Parkinson's disease.  Past Medical History:  Diagnosis Date  . Bursitis   . Cellulitis    arm  . Headache   . Hyperlipidemia   . Insomnia   . OA (osteoarthritis)   . Onychomycosis   . Osteoarthritis   . Parkinson disease (HCC) 03/23/2015  .  RLS (restless legs syndrome) 03/23/2015  . Tenosynovitis     Past Surgical History:  Procedure Laterality Date  . TUBAL LIGATION      Family History  Problem Relation Age of Onset  . Diabetes type II Sister   . Asthma Sister   . Heart failure Mother   . Stroke Mother   . Prostate cancer Father   . Diabetes type II Brother   . Tremor Sister   . Diabetes type II Brother   . Cancer Brother        prostate  . Cancer Brother        prostate  . Cancer Brother        prostate  . Allergic rhinitis Neg Hx   . Angioedema Neg Hx   . Atopy Neg Hx   . Eczema Neg Hx   . Immunodeficiency Neg Hx   . Urticaria Neg Hx     Social history:  reports that she has never smoked. She has never used smokeless tobacco. She reports that she does not drink alcohol or use drugs.  Medications:  Prior to Admission medications   Medication Sig Start Date End Date Taking? Authorizing Provider  acetaminophen (TYLENOL) 650 MG CR tablet Take 650 mg by mouth every 8 (eight) hours as needed for pain.   Yes [provider]  acidophilus (RISAQUAD) CAPS capsule Take 1 capsule by mouth daily.   Yes [provider]  Ascorbic Acid (VITAMIN C) 100 MG tablet Take 100 mg by mouth daily.   Yes [provider]  calcium carbonate 1250 MG capsule Take 1,250 mg by mouth 2 (two) times daily with a meal.   Yes [provider]  cetirizine (ZYRTEC) 10 MG tablet Take 10 mg by mouth daily.   Yes [provider]  famotidine (PEPCID) 20 MG tablet TAKE 1 TABLET BY MOUTH TWICE DAILY 02/11/17  Yes Padgett, Rae Halsted, MD  Ferrous Sulfate (IRON PO) Take 65 mg by mouth.   Yes [provider]  ferrous sulfate 325 (65 FE) MG tablet Take 325 mg by mouth daily with breakfast.   Yes [provider]  montelukast (SINGULAIR) 10 MG tablet Take 1 tablet (10 mg total) by mouth at bedtime. 06/28/16  Yes Padgett, Rae Halsted, MD  Omega-3 Fatty Acids (FISH OIL) 1200 MG CAPS  Take by mouth.   Yes [provider]  OVER THE COUNTER MEDICATION Juice plus  daily   Yes [provider]  potassium chloride SA (K-DUR,KLOR-CON) 20 MEQ tablet Take 20 mEq by mouth daily.    Yes [provider]  pramipexole (MIRAPEX) 0.125 MG tablet Take 1 tablet 3 times daily for 3 weeks, then take 2 tablets 3 times daily for 3 weeks, then take 3 tablets 3 times daily. 09/18/16  Yes [name], MD  vitamin B-12 (CYANOCOBALAMIN) 100 MCG tablet Take 100 mcg by mouth daily.   Yes [provider]  zinc gluconate 50 MG tablet Take 50 mg by mouth daily.   Yes [provider]      Allergies  Allergen Reactions  . Caffeine     Palpitations and pain   . Iodine Swelling  . Iohexol      Desc: FACIAL SWELLING   . Penicillins Itching and Swelling  . Shellfish Allergy Swelling  . Sulfa Drugs Cross Reactors Nausea And Vomiting  . Tramadol     Nausea when she takes with food    ROS:  Out of a complete 14 system review of symptoms, the patient complains only of the following symptoms, and all other reviewed systems are negative.  Tremor Insomnia Weakness Left ankle swelling Left foot numbness  Blood pressure (!) 147/92, pulse 82, temperature (!) 97.5 F (36.4 C), height 5\' 6"  (1.676 m), weight 177 lb (80.3 kg).  Physical Exam  General: The patient is alert and cooperative at the time of the examination.  Eyes: Pupils are equal, round, and reactive to light. Discs are flat bilaterally.  Neck: The neck is supple, no carotid bruits are noted.  Respiratory: The respiratory examination is clear.  Cardiovascular: The cardiovascular examination reveals a regular rate and rhythm, a grade II/VI systolic ejection murmur is noted in the left upper sternal border.  Skin: Extremities are with trace edema with the right ankle, 1+ edema with the left ankle.  Neurologic Exam  Mental status: The patient is alert and oriented x 3 at the time of  the examination. The patient has apparent normal recent and remote memory, with an apparently normal attention span and concentration ability.  Cranial nerves: Facial symmetry is present. There is good sensation of the face to pinprick and soft touch bilaterally. The strength of the facial muscles and the muscles to head turning and shoulder shrug are normal bilaterally. Speech is well enunciated, no aphasia or dysarthria is noted. Extraocular  movements are full. Visual fields are full. The tongue is midline, and the patient has symmetric elevation of the soft palate. No obvious hearing deficits are noted.  Masking of the face is seen.  Motor: The motor testing reveals 5 over 5 strength of all 4 extremities, with exception of significant weakness with dorsiflexion, plantarflexion, inversion, and eversion of the left foot, normal on the right.  Toe flexion and extension on the left is also weak. Good symmetric motor tone is noted throughout.  Sensory: Sensory testing is intact to pinprick, soft touch, vibration sensation, and position sense on all 4 extremities, with exception of slight decrease in pinprick sensation on the left foot. No evidence of extinction is noted.  Coordination: Cerebellar testing reveals good finger-nose-finger and heel-to-shin bilaterally.  Prominent resting tremors are noted with the left upper extremity and with the left lower extremity.  Gait and station: Gait is slightly wide-based, the patient is able to walk without assistance.  Decreased arm swing is seen bilaterally, more prominent on the left with the left arm in slight flexion and the left hand, fingers are clenched.  Tandem gait is slightly unsteady.  Romberg is negative.  Reflexes: Deep tendon reflexes are symmetric and normal bilaterally.  The ankle jerk reflexes are well-maintained bilaterally.  Toes are downgoing bilaterally.   Assessment/Plan:  1.  Parkinson's disease with left-sided features  2.  Left foot  drop  The patient appears to have significant distal weakness of the left leg that likely is not related to the Parkinson's disease.  The patient has weakness in the L5 and S1 innervated muscles but the left ankle jerk reflex is well-maintained.  We will perform nerve conduction studies of both legs and EMG on the left leg to determine whether or not this is a peripheral nervous system problem or possibly a central nervous system issue.  If the EMG is unremarkable, MRI of the brain will be done.  The patient has been seen previously by Dr. [name] for low back issues.  The patient will follow-up here in 4 months otherwise.  She will be placed on Sinemet taking the 25/100 mg tablets, 1/2 tablet 3 times daily for 4 weeks and then 1 full tablet 3 times daily.  She will call if she has any side effects.  Marlan Palau MD 03/31/2019 8:44 AM  [facility] [address] [place], [place] [address]  Phone [phone] Fax [phone]

## 2019-04-06 DIAGNOSIS — H2513 Age-related nuclear cataract, bilateral: Secondary | ICD-10-CM | POA: Diagnosis not present

## 2019-04-06 DIAGNOSIS — H1131 Conjunctival hemorrhage, right eye: Secondary | ICD-10-CM | POA: Diagnosis not present

## 2019-04-06 DIAGNOSIS — H11133 Conjunctival pigmentations, bilateral: Secondary | ICD-10-CM | POA: Diagnosis not present

## 2019-04-06 DIAGNOSIS — H11153 Pinguecula, bilateral: Secondary | ICD-10-CM | POA: Diagnosis not present

## 2019-04-07 DIAGNOSIS — R69 Illness, unspecified: Secondary | ICD-10-CM | POA: Diagnosis not present

## 2019-04-19 DIAGNOSIS — M8588 Other specified disorders of bone density and structure, other site: Secondary | ICD-10-CM | POA: Diagnosis not present

## 2019-04-19 DIAGNOSIS — Z13820 Encounter for screening for osteoporosis: Secondary | ICD-10-CM | POA: Diagnosis not present

## 2019-04-19 DIAGNOSIS — M810 Age-related osteoporosis without current pathological fracture: Secondary | ICD-10-CM | POA: Diagnosis not present

## 2019-04-20 ENCOUNTER — Telehealth: Payer: Self-pay | Admitting: Neurology

## 2019-04-20 NOTE — Telephone Encounter (Signed)
I called pt about her prescription that Dr.Willis sent in 03/31/2019 states to take 1/2 dosage three times daily  for 4 weeks than increase to 1 tablet three times daily. PT verbalized understanding.

## 2019-04-20 NOTE — Telephone Encounter (Signed)
Phone rep checked office voicemails, pt left a a message at 4:32 yesterday asking for clarity on her carbidopa-levodopa (SINEMET IR) 25-100 MG tablet.  Pt wants to know if she is to take this for 28days or an entire month.  Please call, pt asking that a voicemail be left if she is unavailable.

## 2019-04-27 ENCOUNTER — Ambulatory Visit (INDEPENDENT_AMBULATORY_CARE_PROVIDER_SITE_OTHER): Payer: Medicare HMO | Admitting: Neurology

## 2019-04-27 ENCOUNTER — Other Ambulatory Visit: Payer: Self-pay

## 2019-04-27 ENCOUNTER — Encounter: Payer: Self-pay | Admitting: Neurology

## 2019-04-27 DIAGNOSIS — M21372 Foot drop, left foot: Secondary | ICD-10-CM | POA: Diagnosis not present

## 2019-04-27 DIAGNOSIS — G5702 Lesion of sciatic nerve, left lower limb: Secondary | ICD-10-CM

## 2019-04-27 HISTORY — DX: Lesion of sciatic nerve, left lower limb: G57.02

## 2019-04-27 NOTE — Procedures (Signed)
     HISTORY:  [name] [name] is a 69 year old patient with history of Parkinson's disease who developed a foot drop on the left.  The patient is being evaluated for a possible neuropathy or a lumbosacral radiculopathy.  NERVE CONDUCTION STUDIES:  Nerve conduction studies were performed on both lower extremities.  The distal motor latencies and motor amplitudes for the peroneal and posterior tibial nerves were normal bilaterally with exception of a low motor amplitude for the left peroneal nerve.  The nerve conduction velocities of the peroneal nerves were normal bilaterally but were borderline normal above and below the fibular head on the left.  Nerve conduction velocities for the posterior tibial nerves were borderline normal bilaterally.  The sural and peroneal sensory latencies were within normal limits bilaterally.  The F-wave latencies for the posterior tibial nerves were within normal limits bilaterally.  EMG STUDIES:  EMG study was performed on the left lower extremity:  The tibialis anterior muscle reveals 2 to 4K motor units with full recruitment. No fibrillations or positive waves were seen. The peroneus tertius muscle reveals 2 to 4K motor units with decreased recruitment. 2+ positive waves were seen. The medial gastrocnemius muscle reveals 1 to 3K motor units with full recruitment. No fibrillations or positive waves were seen. The vastus lateralis muscle reveals 2 to 4K motor units with full recruitment. No fibrillations or positive waves were seen. The iliopsoas muscle reveals 2 to 4K motor units with full recruitment. No fibrillations or positive waves were seen. The biceps femoris muscle (long head) reveals 2 to 4K motor units with full recruitment. No fibrillations or positive waves were seen. The lumbosacral paraspinal muscles were tested at 3 levels, and revealed no abnormalities of insertional activity at all 3 levels tested. There was good relaxation.  The EMG study was  technically difficult to some degree because of muscle artifact seen with tremors involving the entire left lower extremity including the paraspinal muscles.   IMPRESSION:   Nerve conduction studies done on both lower extremities shows evidence of conduction slowing across the fibular head for the left peroneal nerve.  EMG of the left lower extremity shows isolated acute denervation of the peroneus tertius muscle.  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  There is no evidence of an overlying lumbosacral radiculopathy.  Marlan Palau MD 04/27/2019 4:23 PM  [facility] [address] [place], [place] [address]  Phone [phone] Fax [phone]

## 2019-04-27 NOTE — Progress Notes (Addendum)
The patient comes into the office today for EMG nerve conduction study evaluation for evaluation of a left foot drop.  She clinically has improved with the strength with dorsiflexion of the left foot, nerve conduction studies do show some slowing of the left peroneal nerve across the fibular head, EMG shows isolated acute denervation of the peroneus tertius muscle most consistent with a healing left peroneal neuropathy.     MNC    Nerve / Sites Muscle Latency Ref. Amplitude Ref. Rel Amp Segments Distance Velocity Ref. Area    ms ms mV mV %  cm m/s m/s mVms  R Peroneal - EDB     Ankle EDB 4.7 ?6.5 3.5 ?2.0 100 Ankle - EDB 9   12.8     Fib head EDB 11.2  3.5  99.5 Fib head - Ankle 30 46 ?44 13.0     Pop fossa EDB 13.5  2.8  80.5 Pop fossa - Fib head 10 44 ?44 10.3         Pop fossa - Ankle      L Peroneal - EDB     Ankle EDB 5.5 ?6.5 1.5 ?2.0 100 Ankle - EDB 9   5.6     Fib head EDB 12.3  1.4  88.6 Fib head - Ankle 30 44 ?44 5.0     Pop fossa EDB 14.5  1.2  90.5 Pop fossa - Fib head 10 44 ?44 5.0         Pop fossa - Ankle      R Tibial - AH     Ankle AH 4.4 ?5.8 4.6 ?4.0 100 Ankle - AH 9   8.7     Pop fossa AH 13.9  3.9  84 Pop fossa - Ankle 39 41 ?41 10.0  L Tibial - AH     Ankle AH 4.5 ?5.8 5.3 ?4.0 100 Ankle - AH 9   18.6     Pop fossa AH 14.3  3.0  57 Pop fossa - Ankle 40 41 ?41 16.5             SNC    Nerve / Sites Rec. Site Peak Lat Ref.  Amp Ref. Segments Distance    ms ms V V  cm  R Sural - Ankle (Calf)     Calf Ankle 3.5 ?4.4 13 ?6 Calf - Ankle 14  L Sural - Ankle (Calf)     Calf Ankle 3.9 ?4.4 9 ?6 Calf - Ankle 14  R Superficial peroneal - Ankle     Lat leg Ankle 3.8 ?4.4 1 ?6 Lat leg - Ankle 14  L Superficial peroneal - Ankle     Lat leg Ankle 3.0 ?4.4 2 ?6 Lat leg - Ankle 14             F  Wave    Nerve F Lat Ref.   ms ms  R Tibial - AH 53.4 ?56.0  L Tibial - AH 53.6 ?56.0

## 2019-04-27 NOTE — Progress Notes (Signed)
Please refer to EMG and nerve conduction procedure note.  

## 2019-05-10 ENCOUNTER — Telehealth: Payer: Self-pay | Admitting: Neurology

## 2019-05-10 NOTE — Telephone Encounter (Signed)
Pt LVM requesting a CB to ask a couple of questions. Pt states she was referred to another neurologist by pcp and was unsure why that was as she is Dr.Willis pt. Pt was advised by pcp to contact [facility]

## 2019-05-11 NOTE — Telephone Encounter (Signed)
Called and spoke with pt. She states PCP was wanting to refer her to pain management because she was told Dr. Anne Hahn would not write long term pain medication. I verified this. She will call PCP back to get appt set up for pain management. Nothing further needed.

## 2019-05-19 DIAGNOSIS — G5603 Carpal tunnel syndrome, bilateral upper limbs: Secondary | ICD-10-CM | POA: Diagnosis not present

## 2019-05-19 DIAGNOSIS — M545 Low back pain: Secondary | ICD-10-CM | POA: Diagnosis not present

## 2019-05-19 DIAGNOSIS — M5412 Radiculopathy, cervical region: Secondary | ICD-10-CM | POA: Diagnosis not present

## 2019-05-19 DIAGNOSIS — M25552 Pain in left hip: Secondary | ICD-10-CM | POA: Diagnosis not present

## 2019-05-19 DIAGNOSIS — G894 Chronic pain syndrome: Secondary | ICD-10-CM | POA: Diagnosis not present

## 2019-05-19 DIAGNOSIS — M25579 Pain in unspecified ankle and joints of unspecified foot: Secondary | ICD-10-CM | POA: Diagnosis not present

## 2019-05-19 DIAGNOSIS — M79602 Pain in left arm: Secondary | ICD-10-CM | POA: Diagnosis not present

## 2019-05-19 DIAGNOSIS — M542 Cervicalgia: Secondary | ICD-10-CM | POA: Diagnosis not present

## 2019-05-25 DIAGNOSIS — M1990 Unspecified osteoarthritis, unspecified site: Secondary | ICD-10-CM | POA: Diagnosis not present

## 2019-05-25 DIAGNOSIS — G3184 Mild cognitive impairment, so stated: Secondary | ICD-10-CM | POA: Diagnosis not present

## 2019-05-25 DIAGNOSIS — M792 Neuralgia and neuritis, unspecified: Secondary | ICD-10-CM | POA: Diagnosis not present

## 2019-05-25 DIAGNOSIS — R269 Unspecified abnormalities of gait and mobility: Secondary | ICD-10-CM | POA: Diagnosis not present

## 2019-05-25 DIAGNOSIS — J309 Allergic rhinitis, unspecified: Secondary | ICD-10-CM | POA: Diagnosis not present

## 2019-05-25 DIAGNOSIS — G20 Parkinson's disease: Secondary | ICD-10-CM | POA: Diagnosis not present

## 2019-05-25 DIAGNOSIS — G8929 Other chronic pain: Secondary | ICD-10-CM | POA: Diagnosis not present

## 2019-05-25 DIAGNOSIS — R030 Elevated blood-pressure reading, without diagnosis of hypertension: Secondary | ICD-10-CM | POA: Diagnosis not present

## 2019-05-25 DIAGNOSIS — Z809 Family history of malignant neoplasm, unspecified: Secondary | ICD-10-CM | POA: Diagnosis not present

## 2019-05-25 DIAGNOSIS — K5900 Constipation, unspecified: Secondary | ICD-10-CM | POA: Diagnosis not present

## 2019-06-02 ENCOUNTER — Other Ambulatory Visit (HOSPITAL_COMMUNITY): Payer: Self-pay | Admitting: Neurology

## 2019-06-02 ENCOUNTER — Other Ambulatory Visit: Payer: Self-pay | Admitting: Neurology

## 2019-06-02 DIAGNOSIS — M5412 Radiculopathy, cervical region: Secondary | ICD-10-CM

## 2019-06-16 DIAGNOSIS — M545 Low back pain: Secondary | ICD-10-CM | POA: Diagnosis not present

## 2019-06-16 DIAGNOSIS — M25552 Pain in left hip: Secondary | ICD-10-CM | POA: Diagnosis not present

## 2019-06-16 DIAGNOSIS — G5603 Carpal tunnel syndrome, bilateral upper limbs: Secondary | ICD-10-CM | POA: Diagnosis not present

## 2019-06-16 DIAGNOSIS — M79602 Pain in left arm: Secondary | ICD-10-CM | POA: Diagnosis not present

## 2019-06-16 DIAGNOSIS — M5412 Radiculopathy, cervical region: Secondary | ICD-10-CM | POA: Diagnosis not present

## 2019-06-16 DIAGNOSIS — M542 Cervicalgia: Secondary | ICD-10-CM | POA: Diagnosis not present

## 2019-06-16 DIAGNOSIS — M25579 Pain in unspecified ankle and joints of unspecified foot: Secondary | ICD-10-CM | POA: Diagnosis not present

## 2019-06-22 ENCOUNTER — Other Ambulatory Visit: Payer: Self-pay | Admitting: Neurology

## 2019-06-28 ENCOUNTER — Encounter (HOSPITAL_COMMUNITY): Payer: Self-pay

## 2019-06-28 ENCOUNTER — Ambulatory Visit (HOSPITAL_COMMUNITY): Payer: BC Managed Care – PPO

## 2019-07-06 ENCOUNTER — Ambulatory Visit (HOSPITAL_COMMUNITY): Payer: Medicare HMO

## 2019-07-06 DIAGNOSIS — E782 Mixed hyperlipidemia: Secondary | ICD-10-CM | POA: Diagnosis not present

## 2019-07-08 ENCOUNTER — Telehealth: Payer: Self-pay | Admitting: Neurology

## 2019-07-08 MED ORDER — CARBIDOPA-LEVODOPA ER 25-100 MG PO TBCR: 1.0000 | EXTENDED_RELEASE_TABLET | Freq: Three times a day (TID) | ORAL | 3 refills | Status: DC

## 2019-07-08 NOTE — Telephone Encounter (Signed)
I called the patient the patient is on Sinemet IR 25/100 mg tablets taking 1 full tablet 3 times daily.  She is having some nausea with this but the medication is significantly helped her anxiety.  We will switch her to Sinemet CR to help with the nausea, if this is not helpful, we may add lodosyn.

## 2019-07-08 NOTE — Telephone Encounter (Signed)
Pt called to inform that if she doesn't take carbidopa-levodopa (SINEMET IR) 25-100 MG tablet  She has anxiety however pt states when she does take it, it causes nausea even when following bottle suggestions.

## 2019-07-14 ENCOUNTER — Other Ambulatory Visit: Payer: Self-pay | Admitting: Neurology

## 2019-07-23 ENCOUNTER — Other Ambulatory Visit (HOSPITAL_COMMUNITY): Payer: Medicare HMO

## 2019-07-23 ENCOUNTER — Encounter (HOSPITAL_COMMUNITY): Payer: Self-pay

## 2019-08-03 ENCOUNTER — Ambulatory Visit (HOSPITAL_COMMUNITY): Payer: BC Managed Care – PPO

## 2019-08-04 ENCOUNTER — Telehealth: Payer: Self-pay | Admitting: Neurology

## 2019-08-16 ENCOUNTER — Ambulatory Visit (HOSPITAL_COMMUNITY): Payer: BC Managed Care – PPO

## 2019-08-16 ENCOUNTER — Encounter (HOSPITAL_COMMUNITY): Payer: Self-pay

## 2019-08-19 DIAGNOSIS — G5603 Carpal tunnel syndrome, bilateral upper limbs: Secondary | ICD-10-CM | POA: Diagnosis not present

## 2019-08-19 DIAGNOSIS — M79602 Pain in left arm: Secondary | ICD-10-CM | POA: Diagnosis not present

## 2019-08-19 DIAGNOSIS — M25579 Pain in unspecified ankle and joints of unspecified foot: Secondary | ICD-10-CM | POA: Diagnosis not present

## 2019-08-19 DIAGNOSIS — M25552 Pain in left hip: Secondary | ICD-10-CM | POA: Diagnosis not present

## 2019-08-19 DIAGNOSIS — M545 Low back pain: Secondary | ICD-10-CM | POA: Diagnosis not present

## 2019-08-19 DIAGNOSIS — M5412 Radiculopathy, cervical region: Secondary | ICD-10-CM | POA: Diagnosis not present

## 2019-08-19 DIAGNOSIS — M542 Cervicalgia: Secondary | ICD-10-CM | POA: Diagnosis not present

## 2019-08-20 DIAGNOSIS — R05 Cough: Secondary | ICD-10-CM | POA: Diagnosis not present

## 2019-08-20 DIAGNOSIS — I10 Essential (primary) hypertension: Secondary | ICD-10-CM | POA: Diagnosis not present

## 2019-08-24 ENCOUNTER — Other Ambulatory Visit (HOSPITAL_COMMUNITY): Payer: BC Managed Care – PPO

## 2019-08-24 ENCOUNTER — Encounter (HOSPITAL_COMMUNITY): Payer: Self-pay

## 2019-08-31 DIAGNOSIS — M79602 Pain in left arm: Secondary | ICD-10-CM | POA: Diagnosis not present

## 2019-08-31 DIAGNOSIS — M542 Cervicalgia: Secondary | ICD-10-CM | POA: Diagnosis not present

## 2019-09-01 DIAGNOSIS — M79602 Pain in left arm: Secondary | ICD-10-CM | POA: Diagnosis not present

## 2019-09-01 DIAGNOSIS — M542 Cervicalgia: Secondary | ICD-10-CM | POA: Diagnosis not present

## 2019-09-07 DIAGNOSIS — M542 Cervicalgia: Secondary | ICD-10-CM | POA: Diagnosis not present

## 2019-09-07 DIAGNOSIS — M79602 Pain in left arm: Secondary | ICD-10-CM | POA: Diagnosis not present

## 2019-09-08 DIAGNOSIS — M542 Cervicalgia: Secondary | ICD-10-CM | POA: Diagnosis not present

## 2019-09-08 DIAGNOSIS — M79602 Pain in left arm: Secondary | ICD-10-CM | POA: Diagnosis not present

## 2019-09-15 DIAGNOSIS — M79602 Pain in left arm: Secondary | ICD-10-CM | POA: Diagnosis not present

## 2019-09-15 DIAGNOSIS — M542 Cervicalgia: Secondary | ICD-10-CM | POA: Diagnosis not present

## 2019-09-16 DIAGNOSIS — M79602 Pain in left arm: Secondary | ICD-10-CM | POA: Diagnosis not present

## 2019-09-16 DIAGNOSIS — H524 Presbyopia: Secondary | ICD-10-CM | POA: Diagnosis not present

## 2019-09-16 DIAGNOSIS — H2513 Age-related nuclear cataract, bilateral: Secondary | ICD-10-CM | POA: Diagnosis not present

## 2019-09-16 DIAGNOSIS — H11133 Conjunctival pigmentations, bilateral: Secondary | ICD-10-CM | POA: Diagnosis not present

## 2019-09-16 DIAGNOSIS — M542 Cervicalgia: Secondary | ICD-10-CM | POA: Diagnosis not present

## 2019-09-16 DIAGNOSIS — H52223 Regular astigmatism, bilateral: Secondary | ICD-10-CM | POA: Diagnosis not present

## 2019-09-16 DIAGNOSIS — H43811 Vitreous degeneration, right eye: Secondary | ICD-10-CM | POA: Diagnosis not present

## 2019-09-16 DIAGNOSIS — H11153 Pinguecula, bilateral: Secondary | ICD-10-CM | POA: Diagnosis not present

## 2019-09-16 DIAGNOSIS — H5203 Hypermetropia, bilateral: Secondary | ICD-10-CM | POA: Diagnosis not present

## 2019-09-16 DIAGNOSIS — H47093 Other disorders of optic nerve, not elsewhere classified, bilateral: Secondary | ICD-10-CM | POA: Diagnosis not present

## 2019-09-21 DIAGNOSIS — R05 Cough: Secondary | ICD-10-CM | POA: Diagnosis not present

## 2019-09-21 DIAGNOSIS — M542 Cervicalgia: Secondary | ICD-10-CM | POA: Diagnosis not present

## 2019-09-21 DIAGNOSIS — M79602 Pain in left arm: Secondary | ICD-10-CM | POA: Diagnosis not present

## 2019-09-22 DIAGNOSIS — M542 Cervicalgia: Secondary | ICD-10-CM | POA: Diagnosis not present

## 2019-09-22 DIAGNOSIS — M79602 Pain in left arm: Secondary | ICD-10-CM | POA: Diagnosis not present

## 2019-09-24 DIAGNOSIS — G20 Parkinson's disease: Secondary | ICD-10-CM | POA: Diagnosis not present

## 2019-09-24 DIAGNOSIS — E7849 Other hyperlipidemia: Secondary | ICD-10-CM | POA: Diagnosis not present

## 2019-09-24 DIAGNOSIS — I10 Essential (primary) hypertension: Secondary | ICD-10-CM | POA: Diagnosis not present

## 2019-09-24 DIAGNOSIS — M1711 Unilateral primary osteoarthritis, right knee: Secondary | ICD-10-CM | POA: Diagnosis not present

## 2019-09-27 DIAGNOSIS — Z23 Encounter for immunization: Secondary | ICD-10-CM | POA: Diagnosis not present

## 2019-09-28 DIAGNOSIS — M542 Cervicalgia: Secondary | ICD-10-CM | POA: Diagnosis not present

## 2019-09-28 DIAGNOSIS — M79602 Pain in left arm: Secondary | ICD-10-CM | POA: Diagnosis not present

## 2019-09-28 DIAGNOSIS — E782 Mixed hyperlipidemia: Secondary | ICD-10-CM | POA: Diagnosis not present

## 2019-09-29 DIAGNOSIS — M79602 Pain in left arm: Secondary | ICD-10-CM | POA: Diagnosis not present

## 2019-09-29 DIAGNOSIS — M542 Cervicalgia: Secondary | ICD-10-CM | POA: Diagnosis not present

## 2019-10-05 DIAGNOSIS — M79602 Pain in left arm: Secondary | ICD-10-CM | POA: Diagnosis not present

## 2019-10-06 DIAGNOSIS — M79602 Pain in left arm: Secondary | ICD-10-CM | POA: Diagnosis not present

## 2019-10-12 DIAGNOSIS — M79642 Pain in left hand: Secondary | ICD-10-CM | POA: Diagnosis not present

## 2019-10-12 DIAGNOSIS — R278 Other lack of coordination: Secondary | ICD-10-CM | POA: Diagnosis not present

## 2019-10-12 DIAGNOSIS — G20 Parkinson's disease: Secondary | ICD-10-CM | POA: Diagnosis not present

## 2019-10-12 DIAGNOSIS — M79602 Pain in left arm: Secondary | ICD-10-CM | POA: Diagnosis not present

## 2019-10-12 DIAGNOSIS — M6281 Muscle weakness (generalized): Secondary | ICD-10-CM | POA: Diagnosis not present

## 2019-10-13 DIAGNOSIS — M79602 Pain in left arm: Secondary | ICD-10-CM | POA: Diagnosis not present

## 2019-10-14 DIAGNOSIS — M545 Low back pain: Secondary | ICD-10-CM | POA: Diagnosis not present

## 2019-10-14 DIAGNOSIS — M79602 Pain in left arm: Secondary | ICD-10-CM | POA: Diagnosis not present

## 2019-10-14 DIAGNOSIS — G5603 Carpal tunnel syndrome, bilateral upper limbs: Secondary | ICD-10-CM | POA: Diagnosis not present

## 2019-10-14 DIAGNOSIS — M5412 Radiculopathy, cervical region: Secondary | ICD-10-CM | POA: Diagnosis not present

## 2019-10-14 DIAGNOSIS — R269 Unspecified abnormalities of gait and mobility: Secondary | ICD-10-CM | POA: Diagnosis not present

## 2019-10-14 DIAGNOSIS — M25552 Pain in left hip: Secondary | ICD-10-CM | POA: Diagnosis not present

## 2019-10-14 DIAGNOSIS — M542 Cervicalgia: Secondary | ICD-10-CM | POA: Diagnosis not present

## 2019-10-14 DIAGNOSIS — M25579 Pain in unspecified ankle and joints of unspecified foot: Secondary | ICD-10-CM | POA: Diagnosis not present

## 2019-10-18 DIAGNOSIS — M79602 Pain in left arm: Secondary | ICD-10-CM | POA: Diagnosis not present

## 2019-10-20 DIAGNOSIS — M79602 Pain in left arm: Secondary | ICD-10-CM | POA: Diagnosis not present

## 2019-10-25 DIAGNOSIS — Z23 Encounter for immunization: Secondary | ICD-10-CM | POA: Diagnosis not present

## 2019-10-26 DIAGNOSIS — E7849 Other hyperlipidemia: Secondary | ICD-10-CM | POA: Diagnosis not present

## 2019-10-26 DIAGNOSIS — G20 Parkinson's disease: Secondary | ICD-10-CM | POA: Diagnosis not present

## 2019-10-26 DIAGNOSIS — M1711 Unilateral primary osteoarthritis, right knee: Secondary | ICD-10-CM | POA: Diagnosis not present

## 2019-10-26 DIAGNOSIS — I10 Essential (primary) hypertension: Secondary | ICD-10-CM | POA: Diagnosis not present

## 2019-10-27 DIAGNOSIS — M79602 Pain in left arm: Secondary | ICD-10-CM | POA: Diagnosis not present

## 2019-10-28 DIAGNOSIS — M79602 Pain in left arm: Secondary | ICD-10-CM | POA: Diagnosis not present

## 2019-11-04 ENCOUNTER — Other Ambulatory Visit: Payer: Self-pay | Admitting: Neurology

## 2019-11-09 DIAGNOSIS — M6281 Muscle weakness (generalized): Secondary | ICD-10-CM | POA: Diagnosis not present

## 2019-11-09 DIAGNOSIS — M79602 Pain in left arm: Secondary | ICD-10-CM | POA: Diagnosis not present

## 2019-11-09 DIAGNOSIS — G20 Parkinson's disease: Secondary | ICD-10-CM | POA: Diagnosis not present

## 2019-11-09 DIAGNOSIS — R278 Other lack of coordination: Secondary | ICD-10-CM | POA: Diagnosis not present

## 2019-11-09 DIAGNOSIS — M79642 Pain in left hand: Secondary | ICD-10-CM | POA: Diagnosis not present

## 2019-11-11 DIAGNOSIS — M6281 Muscle weakness (generalized): Secondary | ICD-10-CM | POA: Diagnosis not present

## 2019-11-11 DIAGNOSIS — G20 Parkinson's disease: Secondary | ICD-10-CM | POA: Diagnosis not present

## 2019-11-11 DIAGNOSIS — M79642 Pain in left hand: Secondary | ICD-10-CM | POA: Diagnosis not present

## 2019-11-16 DIAGNOSIS — M79642 Pain in left hand: Secondary | ICD-10-CM | POA: Diagnosis not present

## 2019-11-16 DIAGNOSIS — G20 Parkinson's disease: Secondary | ICD-10-CM | POA: Diagnosis not present

## 2019-11-16 DIAGNOSIS — M6281 Muscle weakness (generalized): Secondary | ICD-10-CM | POA: Diagnosis not present

## 2019-11-17 DIAGNOSIS — G20 Parkinson's disease: Secondary | ICD-10-CM | POA: Diagnosis not present

## 2019-11-17 DIAGNOSIS — M6281 Muscle weakness (generalized): Secondary | ICD-10-CM | POA: Diagnosis not present

## 2019-11-17 DIAGNOSIS — M79642 Pain in left hand: Secondary | ICD-10-CM | POA: Diagnosis not present

## 2019-11-22 DIAGNOSIS — G20 Parkinson's disease: Secondary | ICD-10-CM | POA: Diagnosis not present

## 2019-11-22 DIAGNOSIS — M79642 Pain in left hand: Secondary | ICD-10-CM | POA: Diagnosis not present

## 2019-11-22 DIAGNOSIS — M6281 Muscle weakness (generalized): Secondary | ICD-10-CM | POA: Diagnosis not present

## 2019-11-23 DIAGNOSIS — M79642 Pain in left hand: Secondary | ICD-10-CM | POA: Diagnosis not present

## 2019-11-23 DIAGNOSIS — G20 Parkinson's disease: Secondary | ICD-10-CM | POA: Diagnosis not present

## 2019-11-23 DIAGNOSIS — M6281 Muscle weakness (generalized): Secondary | ICD-10-CM | POA: Diagnosis not present

## 2019-11-25 DIAGNOSIS — E7849 Other hyperlipidemia: Secondary | ICD-10-CM | POA: Diagnosis not present

## 2019-11-25 DIAGNOSIS — I10 Essential (primary) hypertension: Secondary | ICD-10-CM | POA: Diagnosis not present

## 2019-11-25 DIAGNOSIS — M1711 Unilateral primary osteoarthritis, right knee: Secondary | ICD-10-CM | POA: Diagnosis not present

## 2019-11-29 DIAGNOSIS — M79642 Pain in left hand: Secondary | ICD-10-CM | POA: Diagnosis not present

## 2019-11-29 DIAGNOSIS — M6281 Muscle weakness (generalized): Secondary | ICD-10-CM | POA: Diagnosis not present

## 2019-11-29 DIAGNOSIS — G20 Parkinson's disease: Secondary | ICD-10-CM | POA: Diagnosis not present

## 2019-12-09 DIAGNOSIS — R269 Unspecified abnormalities of gait and mobility: Secondary | ICD-10-CM | POA: Diagnosis not present

## 2019-12-09 DIAGNOSIS — M545 Low back pain, unspecified: Secondary | ICD-10-CM | POA: Diagnosis not present

## 2019-12-09 DIAGNOSIS — M25552 Pain in left hip: Secondary | ICD-10-CM | POA: Diagnosis not present

## 2019-12-09 DIAGNOSIS — M79602 Pain in left arm: Secondary | ICD-10-CM | POA: Diagnosis not present

## 2019-12-09 DIAGNOSIS — M542 Cervicalgia: Secondary | ICD-10-CM | POA: Diagnosis not present

## 2019-12-09 DIAGNOSIS — G5603 Carpal tunnel syndrome, bilateral upper limbs: Secondary | ICD-10-CM | POA: Diagnosis not present

## 2019-12-09 DIAGNOSIS — M25579 Pain in unspecified ankle and joints of unspecified foot: Secondary | ICD-10-CM | POA: Diagnosis not present

## 2019-12-09 DIAGNOSIS — M5412 Radiculopathy, cervical region: Secondary | ICD-10-CM | POA: Diagnosis not present

## 2019-12-22 DIAGNOSIS — M6281 Muscle weakness (generalized): Secondary | ICD-10-CM | POA: Diagnosis not present

## 2019-12-22 DIAGNOSIS — M79642 Pain in left hand: Secondary | ICD-10-CM | POA: Diagnosis not present

## 2019-12-22 DIAGNOSIS — R278 Other lack of coordination: Secondary | ICD-10-CM | POA: Diagnosis not present

## 2019-12-22 DIAGNOSIS — G20 Parkinson's disease: Secondary | ICD-10-CM | POA: Diagnosis not present

## 2019-12-23 DIAGNOSIS — R278 Other lack of coordination: Secondary | ICD-10-CM | POA: Diagnosis not present

## 2019-12-23 DIAGNOSIS — G20 Parkinson's disease: Secondary | ICD-10-CM | POA: Diagnosis not present

## 2019-12-23 DIAGNOSIS — M79642 Pain in left hand: Secondary | ICD-10-CM | POA: Diagnosis not present

## 2019-12-23 DIAGNOSIS — M6281 Muscle weakness (generalized): Secondary | ICD-10-CM | POA: Diagnosis not present

## 2019-12-25 DIAGNOSIS — E7849 Other hyperlipidemia: Secondary | ICD-10-CM | POA: Diagnosis not present

## 2019-12-25 DIAGNOSIS — I10 Essential (primary) hypertension: Secondary | ICD-10-CM | POA: Diagnosis not present

## 2019-12-25 DIAGNOSIS — M1711 Unilateral primary osteoarthritis, right knee: Secondary | ICD-10-CM | POA: Diagnosis not present

## 2019-12-27 DIAGNOSIS — M6281 Muscle weakness (generalized): Secondary | ICD-10-CM | POA: Diagnosis not present

## 2019-12-27 DIAGNOSIS — R278 Other lack of coordination: Secondary | ICD-10-CM | POA: Diagnosis not present

## 2019-12-27 DIAGNOSIS — G20 Parkinson's disease: Secondary | ICD-10-CM | POA: Diagnosis not present

## 2019-12-27 DIAGNOSIS — M79642 Pain in left hand: Secondary | ICD-10-CM | POA: Diagnosis not present

## 2019-12-29 DIAGNOSIS — M79642 Pain in left hand: Secondary | ICD-10-CM | POA: Diagnosis not present

## 2019-12-29 DIAGNOSIS — M6281 Muscle weakness (generalized): Secondary | ICD-10-CM | POA: Diagnosis not present

## 2019-12-29 DIAGNOSIS — R278 Other lack of coordination: Secondary | ICD-10-CM | POA: Diagnosis not present

## 2019-12-29 DIAGNOSIS — G20 Parkinson's disease: Secondary | ICD-10-CM | POA: Diagnosis not present

## 2020-01-03 ENCOUNTER — Other Ambulatory Visit: Payer: Self-pay

## 2020-01-03 ENCOUNTER — Ambulatory Visit (HOSPITAL_COMMUNITY)
Admission: RE | Admit: 2020-01-03 | Discharge: 2020-01-03 | Disposition: A | Discharge status: Home / Self Care | Payer: Medicare HMO | Source: Ambulatory Visit | Attending: Neurology | Admitting: Neurology

## 2020-01-03 DIAGNOSIS — M542 Cervicalgia: Secondary | ICD-10-CM | POA: Diagnosis not present

## 2020-01-03 DIAGNOSIS — M5412 Radiculopathy, cervical region: Secondary | ICD-10-CM | POA: Insufficient documentation

## 2020-01-05 DIAGNOSIS — R269 Unspecified abnormalities of gait and mobility: Secondary | ICD-10-CM | POA: Diagnosis not present

## 2020-01-05 DIAGNOSIS — M542 Cervicalgia: Secondary | ICD-10-CM | POA: Diagnosis not present

## 2020-01-05 DIAGNOSIS — M79602 Pain in left arm: Secondary | ICD-10-CM | POA: Diagnosis not present

## 2020-01-05 DIAGNOSIS — G5603 Carpal tunnel syndrome, bilateral upper limbs: Secondary | ICD-10-CM | POA: Diagnosis not present

## 2020-01-05 DIAGNOSIS — M25552 Pain in left hip: Secondary | ICD-10-CM | POA: Diagnosis not present

## 2020-01-05 DIAGNOSIS — M25579 Pain in unspecified ankle and joints of unspecified foot: Secondary | ICD-10-CM | POA: Diagnosis not present

## 2020-01-05 DIAGNOSIS — M5412 Radiculopathy, cervical region: Secondary | ICD-10-CM | POA: Diagnosis not present

## 2020-01-05 DIAGNOSIS — M545 Low back pain, unspecified: Secondary | ICD-10-CM | POA: Diagnosis not present

## 2020-01-06 DIAGNOSIS — M79642 Pain in left hand: Secondary | ICD-10-CM | POA: Diagnosis not present

## 2020-01-06 DIAGNOSIS — G20 Parkinson's disease: Secondary | ICD-10-CM | POA: Diagnosis not present

## 2020-01-06 DIAGNOSIS — M6281 Muscle weakness (generalized): Secondary | ICD-10-CM | POA: Diagnosis not present

## 2020-01-06 DIAGNOSIS — R278 Other lack of coordination: Secondary | ICD-10-CM | POA: Diagnosis not present

## 2020-01-07 DIAGNOSIS — M545 Low back pain, unspecified: Secondary | ICD-10-CM | POA: Diagnosis not present

## 2020-01-07 DIAGNOSIS — M542 Cervicalgia: Secondary | ICD-10-CM | POA: Diagnosis not present

## 2020-01-07 DIAGNOSIS — R269 Unspecified abnormalities of gait and mobility: Secondary | ICD-10-CM | POA: Diagnosis not present

## 2020-01-07 DIAGNOSIS — R2242 Localized swelling, mass and lump, left lower limb: Secondary | ICD-10-CM | POA: Diagnosis not present

## 2020-01-10 DIAGNOSIS — G20 Parkinson's disease: Secondary | ICD-10-CM | POA: Diagnosis not present

## 2020-01-10 DIAGNOSIS — M6281 Muscle weakness (generalized): Secondary | ICD-10-CM | POA: Diagnosis not present

## 2020-01-10 DIAGNOSIS — R278 Other lack of coordination: Secondary | ICD-10-CM | POA: Diagnosis not present

## 2020-01-10 DIAGNOSIS — M79642 Pain in left hand: Secondary | ICD-10-CM | POA: Diagnosis not present

## 2020-01-17 DIAGNOSIS — I10 Essential (primary) hypertension: Secondary | ICD-10-CM | POA: Diagnosis not present

## 2020-01-17 DIAGNOSIS — Z0001 Encounter for general adult medical examination with abnormal findings: Secondary | ICD-10-CM | POA: Diagnosis not present

## 2020-01-17 DIAGNOSIS — G20 Parkinson's disease: Secondary | ICD-10-CM | POA: Diagnosis not present

## 2020-01-17 DIAGNOSIS — D519 Vitamin B12 deficiency anemia, unspecified: Secondary | ICD-10-CM | POA: Diagnosis not present

## 2020-01-17 DIAGNOSIS — R278 Other lack of coordination: Secondary | ICD-10-CM | POA: Diagnosis not present

## 2020-01-17 DIAGNOSIS — M6281 Muscle weakness (generalized): Secondary | ICD-10-CM | POA: Diagnosis not present

## 2020-01-17 DIAGNOSIS — M79642 Pain in left hand: Secondary | ICD-10-CM | POA: Diagnosis not present

## 2020-01-17 DIAGNOSIS — E782 Mixed hyperlipidemia: Secondary | ICD-10-CM | POA: Diagnosis not present

## 2020-01-17 DIAGNOSIS — K219 Gastro-esophageal reflux disease without esophagitis: Secondary | ICD-10-CM | POA: Diagnosis not present

## 2020-01-18 ENCOUNTER — Telehealth: Payer: Self-pay | Admitting: Neurology

## 2020-01-18 NOTE — Telephone Encounter (Signed)
Pt has called to report that Carbidopa-Levodopa ER (SINEMET CR) 25-100 MG tablet controlled release is not working like it once did, please call pt to discuss.

## 2020-01-19 NOTE — Telephone Encounter (Signed)
Just recently on prednisone for 14 days first of November and while taking the Sinemet , patient was shaking more visibly and having more difficulty walking.    Pills had not changed, not different color or shape .  Before taking prednisone, feels the sinemet was working fine.  Just stopped taking the prednisone yesterday, is going to call back Monday if she isn't doing any better.  Wanted to let Dr. Anne Hahn know so he could have a back up plan if she needed one.

## 2020-01-19 NOTE — Telephone Encounter (Signed)
I called the patient.  Being on prednisone could certainly increase tremor, make the patient more jittery.  Hopefully coming off the prednisone will help her feel better.  She was supposed to been seen in June for follow-up for her Parkinson's disease, apparently our office canceled the visit, we will have to reschedule this.

## 2020-01-25 DIAGNOSIS — E7849 Other hyperlipidemia: Secondary | ICD-10-CM | POA: Diagnosis not present

## 2020-01-25 DIAGNOSIS — R278 Other lack of coordination: Secondary | ICD-10-CM | POA: Diagnosis not present

## 2020-01-25 DIAGNOSIS — M5432 Sciatica, left side: Secondary | ICD-10-CM | POA: Diagnosis not present

## 2020-01-25 DIAGNOSIS — I10 Essential (primary) hypertension: Secondary | ICD-10-CM | POA: Diagnosis not present

## 2020-01-25 DIAGNOSIS — M79642 Pain in left hand: Secondary | ICD-10-CM | POA: Diagnosis not present

## 2020-01-25 DIAGNOSIS — M1711 Unilateral primary osteoarthritis, right knee: Secondary | ICD-10-CM | POA: Diagnosis not present

## 2020-01-25 DIAGNOSIS — E782 Mixed hyperlipidemia: Secondary | ICD-10-CM | POA: Diagnosis not present

## 2020-01-25 DIAGNOSIS — Z23 Encounter for immunization: Secondary | ICD-10-CM | POA: Diagnosis not present

## 2020-01-25 DIAGNOSIS — M6281 Muscle weakness (generalized): Secondary | ICD-10-CM | POA: Diagnosis not present

## 2020-01-25 DIAGNOSIS — Z6827 Body mass index (BMI) 27.0-27.9, adult: Secondary | ICD-10-CM | POA: Diagnosis not present

## 2020-01-25 DIAGNOSIS — Z0000 Encounter for general adult medical examination without abnormal findings: Secondary | ICD-10-CM | POA: Diagnosis not present

## 2020-01-25 DIAGNOSIS — G20 Parkinson's disease: Secondary | ICD-10-CM | POA: Diagnosis not present

## 2020-01-26 ENCOUNTER — Other Ambulatory Visit: Payer: Self-pay | Admitting: Neurology

## 2020-01-31 DIAGNOSIS — R030 Elevated blood-pressure reading, without diagnosis of hypertension: Secondary | ICD-10-CM | POA: Diagnosis not present

## 2020-01-31 DIAGNOSIS — Z6828 Body mass index (BMI) 28.0-28.9, adult: Secondary | ICD-10-CM | POA: Diagnosis not present

## 2020-01-31 DIAGNOSIS — M48062 Spinal stenosis, lumbar region with neurogenic claudication: Secondary | ICD-10-CM | POA: Diagnosis not present

## 2020-01-31 DIAGNOSIS — M4722 Other spondylosis with radiculopathy, cervical region: Secondary | ICD-10-CM | POA: Diagnosis not present

## 2020-01-31 DIAGNOSIS — M25512 Pain in left shoulder: Secondary | ICD-10-CM | POA: Diagnosis not present

## 2020-02-01 DIAGNOSIS — D72829 Elevated white blood cell count, unspecified: Secondary | ICD-10-CM | POA: Diagnosis not present

## 2020-02-01 DIAGNOSIS — Z6827 Body mass index (BMI) 27.0-27.9, adult: Secondary | ICD-10-CM | POA: Diagnosis not present

## 2020-02-07 DIAGNOSIS — Z6827 Body mass index (BMI) 27.0-27.9, adult: Secondary | ICD-10-CM | POA: Diagnosis not present

## 2020-02-07 DIAGNOSIS — Z01419 Encounter for gynecological examination (general) (routine) without abnormal findings: Secondary | ICD-10-CM | POA: Diagnosis not present

## 2020-02-09 DIAGNOSIS — M6281 Muscle weakness (generalized): Secondary | ICD-10-CM | POA: Diagnosis not present

## 2020-02-09 DIAGNOSIS — G20 Parkinson's disease: Secondary | ICD-10-CM | POA: Diagnosis not present

## 2020-02-09 DIAGNOSIS — M79642 Pain in left hand: Secondary | ICD-10-CM | POA: Diagnosis not present

## 2020-02-09 DIAGNOSIS — R278 Other lack of coordination: Secondary | ICD-10-CM | POA: Diagnosis not present

## 2020-02-15 ENCOUNTER — Other Ambulatory Visit (HOSPITAL_COMMUNITY): Payer: Self-pay | Admitting: Neurosurgery

## 2020-02-15 ENCOUNTER — Other Ambulatory Visit: Payer: Self-pay | Admitting: Neurosurgery

## 2020-02-15 DIAGNOSIS — M4722 Other spondylosis with radiculopathy, cervical region: Secondary | ICD-10-CM

## 2020-02-15 DIAGNOSIS — M48062 Spinal stenosis, lumbar region with neurogenic claudication: Secondary | ICD-10-CM

## 2020-02-16 ENCOUNTER — Ambulatory Visit (HOSPITAL_COMMUNITY)
Admission: RE | Admit: 2020-02-16 | Discharge: 2020-02-16 | Disposition: A | Discharge status: Home / Self Care | Payer: Medicare HMO | Source: Ambulatory Visit | Attending: Neurosurgery | Admitting: Neurosurgery

## 2020-02-16 ENCOUNTER — Other Ambulatory Visit: Payer: Self-pay

## 2020-02-16 DIAGNOSIS — M4186 Other forms of scoliosis, lumbar region: Secondary | ICD-10-CM | POA: Diagnosis not present

## 2020-02-16 DIAGNOSIS — M47816 Spondylosis without myelopathy or radiculopathy, lumbar region: Secondary | ICD-10-CM | POA: Diagnosis not present

## 2020-02-16 DIAGNOSIS — M48061 Spinal stenosis, lumbar region without neurogenic claudication: Secondary | ICD-10-CM | POA: Diagnosis not present

## 2020-02-16 DIAGNOSIS — M4802 Spinal stenosis, cervical region: Secondary | ICD-10-CM | POA: Diagnosis not present

## 2020-02-16 DIAGNOSIS — M48062 Spinal stenosis, lumbar region with neurogenic claudication: Secondary | ICD-10-CM

## 2020-02-16 DIAGNOSIS — M5410 Radiculopathy, site unspecified: Secondary | ICD-10-CM | POA: Diagnosis not present

## 2020-02-16 DIAGNOSIS — M542 Cervicalgia: Secondary | ICD-10-CM | POA: Diagnosis not present

## 2020-02-16 DIAGNOSIS — M5136 Other intervertebral disc degeneration, lumbar region: Secondary | ICD-10-CM | POA: Diagnosis not present

## 2020-02-16 DIAGNOSIS — M4722 Other spondylosis with radiculopathy, cervical region: Secondary | ICD-10-CM

## 2020-02-16 DIAGNOSIS — M50221 Other cervical disc displacement at C4-C5 level: Secondary | ICD-10-CM | POA: Diagnosis not present

## 2020-02-16 DIAGNOSIS — M47812 Spondylosis without myelopathy or radiculopathy, cervical region: Secondary | ICD-10-CM | POA: Diagnosis not present

## 2020-02-16 MED ORDER — ONDANSETRON HCL 4 MG/2ML IJ SOLN: 4.0000 mg | Freq: Four times a day (QID) | INTRAMUSCULAR | Status: DC | PRN

## 2020-02-16 MED ORDER — IOHEXOL 300 MG/ML  SOLN
10.0000 mL | Freq: Once | INTRAMUSCULAR | Status: AC | PRN
  Administered 2020-02-16: 10 mL via INTRATHECAL

## 2020-02-16 MED ORDER — OXYCODONE HCL 5 MG PO TABS
5.0000 mg | ORAL_TABLET | ORAL | Status: DC | PRN
  Filled 2020-02-16: qty 2

## 2020-02-16 MED ORDER — DIAZEPAM 5 MG PO TABS
10.0000 mg | ORAL_TABLET | Freq: Once | ORAL | Status: AC
  Administered 2020-02-16: 10 mg via ORAL
  Filled 2020-02-16: qty 2

## 2020-02-16 MED ORDER — LIDOCAINE HCL (PF) 1 % IJ SOLN
5.0000 mL | Freq: Once | INTRAMUSCULAR | Status: AC
  Administered 2020-02-16: 5 mL via INTRADERMAL

## 2020-02-16 NOTE — Progress Notes (Signed)
Patient was given discharge instructions. She verbalized understanding. 

## 2020-02-16 NOTE — Op Note (Signed)
02/16/2020 Cervical and lumbar Myelogram  PATIENT:  [name] [name] is a 69 y.o. female with neck and lower back pain.   PRE-OPERATIVE DIAGNOSIS:  Cervicalgia, degenerative lumbar scoliosis  POST-OPERATIVE DIAGNOSIS:  same  PROCEDURE:  Lumbar Myelogram  SURGEON:  Vauda Salvucci  ANESTHESIA:   local LOCAL MEDICATIONS USED:  LIDOCAINE  Procedure Note: [name] is a 69 y.o. female Was taken to the fluoroscopy suite and  positioned prone on the fluoroscopy table. Her back was prepared and draped in a sterile manner. I infiltrated 8 cc into the lumbar region. I then introduced a spinal needle into the thecal sac at the L4/5 interlaminar space. I infiltrated 10cc of Isovue 300 into the thecal sac. Fluoroscopy showed the needle and contrast in the thecal sac. [name] tolerated the procedure well. she Will be taken to CT for evaluation.     PATIENT DISPOSITION:  Short Stay

## 2020-02-16 NOTE — Discharge Instructions (Signed)
Myelogram  A myelogram is an imaging study of the spinal cord and the places where nerves attach to the spinal cord (nerve roots). A dye (contrast material) is injected into the spine before the X-ray. This provides a clearer image for your health care provider to see. You may need this study done if you have a spinal cord problem that cannot be diagnosed with other imaging studies, such as a CT scan or an MRI. You may also have this study to check your spine after surgery. Tell a health care provider about:  Any allergies you have, especially to iodine.  All medicines you are taking, including vitamins, herbs, eye drops, creams, and over-the-counter medicines.  Any problems you or family members have had with anesthetic medicines or contrast material.  Any blood disorders you have.  Any surgeries you have had.  Any medical conditions you have or have had, including asthma.  Whether you are pregnant or may be pregnant. What are the risks? Generally, this is a safe procedure. However, problems may occur, including:  Infection.  Bleeding.  Allergic reaction to medicines or dyes.  Damage to your spinal cord or nerves.  Loss or leaking of spinal fluid. This can lead to headaches.  Damage to kidneys.  Seizures. This is rare. What happens before the procedure?  Follow instructions from your health care provider about eating or drinking restrictions. You may be asked to drink more fluids.  Ask your health care provider about changing or stopping your regular medicines. This is especially important if you are taking diabetes medicines or blood thinners.  Plan to have someone take you home from the hospital or clinic.  If you will be going home right after the procedure, plan to have someone with you for 24 hours. What happens during the procedure?  You will lie face down on a table.  Your health care provider will locate the best injection site on your spine. This is most  often in the lower back.  The area of injection will be washed with soap.  You will be given a medicine to numb the area (local anesthetic).  Your health care provider will insert a long needle into the space around your spinal cord (subarachnoid space).  A sample of spinal fluid may be taken and sent to the lab for testing.  The contrast material will be injected into the subarachnoid space.  The exam table may be tilted to help the contrast material flow up or down your spine.  The X-ray will take images of your spinal cord for your health care provider to examine.  A bandage (dressing) may be placed over the injection site. The procedure may vary among health care providers and hospitals. What can I expect after this procedure?  Your blood pressure, heart rate, breathing rate, and blood oxygen level may be monitored until you leave the hospital or clinic.  You may have: ? Soreness on your injection site. ? A mild headache.  You will be asked to lie down with your head raised (elevated). This reduces the risk of a headache.  It is up to you to get the results of your procedure. Ask your health care provider, or the department that is doing the procedure, when your results will be ready. Follow these instructions at home:   Rest as told by your health care provider. Lie flat with your head slightly elevated to reduce the risk of a headache.  Do not bend, lift, or do hard work   for 24-48 hours, or as told by your health care provider.  Take over-the-counter and prescription medicines only as told by your health care provider.  Take care of your dressing as told by your health care provider.  Drink enough fluid to keep your urine pale yellow.  Bathe or shower as told by your health care provider. Contact a health care provider if:  You have a fever.  You have a headache that lasts longer than 24 hours.  You feel nauseous or vomit.  You have a stiff neck or numbness in  your legs.  You are unable to urinate or have a bowel movement.  You develop a rash, itching, or sneezing. Get help right away if:  You have new symptoms or your symptoms get worse.  You have a seizure.  You have trouble breathing. Summary  A myelogram is an imaging study of the spinal cord and the places where nerves attach to the spinal cord (nerve roots).  Before the procedure, follow instructions from your health care provider about changing or stopping your regular medicines, and eating and drinking restrictions.  After this procedure, you will be asked to lie down with your head raised (elevated). This reduces your risk of a headache.  Do not bend, lift, or do hard work for 24-48 hours, or as told by your health care provider.  Contact a health care provider if you have a stiff neck or numbness in your legs. Get help right away if symptoms get worse, or you have a seizure or trouble breathing. This information is not intended to replace advice given to you by your health care provider. Make sure you discuss any questions you have with your health care provider. Document Revised: 04/22/2018 Document Reviewed: 04/23/2018 Elsevier Patient Education  2020 [employer]  

## 2020-02-25 DIAGNOSIS — I10 Essential (primary) hypertension: Secondary | ICD-10-CM | POA: Diagnosis not present

## 2020-02-25 DIAGNOSIS — E7849 Other hyperlipidemia: Secondary | ICD-10-CM | POA: Diagnosis not present

## 2020-02-25 DIAGNOSIS — M1711 Unilateral primary osteoarthritis, right knee: Secondary | ICD-10-CM | POA: Diagnosis not present

## 2020-02-29 ENCOUNTER — Ambulatory Visit (INDEPENDENT_AMBULATORY_CARE_PROVIDER_SITE_OTHER): Payer: Medicare HMO | Admitting: Neurology

## 2020-02-29 ENCOUNTER — Encounter: Payer: Self-pay | Admitting: Neurology

## 2020-02-29 VITALS — BP 149/93 | HR 81 | Ht 66.0 in | Wt 173.0 lb

## 2020-02-29 DIAGNOSIS — G20 Parkinson's disease: Secondary | ICD-10-CM

## 2020-02-29 DIAGNOSIS — G5702 Lesion of sciatic nerve, left lower limb: Secondary | ICD-10-CM | POA: Diagnosis not present

## 2020-02-29 DIAGNOSIS — M4722 Other spondylosis with radiculopathy, cervical region: Secondary | ICD-10-CM | POA: Diagnosis not present

## 2020-02-29 DIAGNOSIS — M48062 Spinal stenosis, lumbar region with neurogenic claudication: Secondary | ICD-10-CM | POA: Diagnosis not present

## 2020-02-29 MED ORDER — BENZTROPINE MESYLATE 0.5 MG PO TABS: 0.5000 mg | ORAL_TABLET | Freq: Three times a day (TID) | ORAL | 1 refills | Status: DC

## 2020-02-29 NOTE — Progress Notes (Signed)
Reason for visit: Parkinson's disease, left foot drop  [name] [name] is an 70 y.o. female  History of present illness:  Ms. Cragin is a 70 year old right-handed black female with a history of Parkinson's disease.  The patient has been followed by Dr. [name] from neurosurgery for neck pain and left shoulder and arm discomfort, she also has had some low back discomfort.  She has a left foot drop but EMG and nerve conduction study showed what looks primarily like a peroneal neuropathy but the patient does have potential for L5 and S1 nerve root compression on the left side by myelogram.  The patient reports ongoing problems with significant tremor, the tremors are on the left side including the arm and the leg.  She has stiffness of the left arm.  She is now on Sinemet taking the 25/100 mg tablets, she takes 1 tablet 3 times daily.  She tolerates this well.  She has had a lot of problems with constipation at baseline, she takes Senokot and docusate combination daily.  The patient does report some change in handwriting even though she is right-handed.  She denies any falls, but she may stumble on occasion.  She believes that the left foot drop has improved some but not normalized.  Past Medical History:  Diagnosis Date  . Bursitis   . Cellulitis    arm  . Common peroneal neuropathy, left 04/27/2019  . Headache   . Hyperlipidemia   . Insomnia   . OA (osteoarthritis)   . Onychomycosis   . Osteoarthritis   . Parkinson disease (HCC) 03/23/2015  . RLS (restless legs syndrome) 03/23/2015  . Tenosynovitis     Past Surgical History:  Procedure Laterality Date  . TUBAL LIGATION      Family History  Problem Relation Age of Onset  . Diabetes type II Sister   . Asthma Sister   . Heart failure Mother   . Stroke Mother   . Prostate cancer Father   . Diabetes type II Brother   . Tremor Sister   . Diabetes type II Brother   . Cancer Brother        prostate  . Cancer Brother        prostate   . Cancer Brother        prostate  . Allergic rhinitis Neg Hx   . Angioedema Neg Hx   . Atopy Neg Hx   . Eczema Neg Hx   . Immunodeficiency Neg Hx   . Urticaria Neg Hx     Social history:  reports that she has never smoked. She has never used smokeless tobacco. She reports that she does not drink alcohol and does not use drugs.    Allergies  Allergen Reactions  . Iodine Swelling  . Iohexol Swelling     Desc: FACIAL SWELLING and temporary blindness   . Penicillins Itching and Swelling  . Shellfish Allergy Swelling  . Caffeine Other (See Comments)    Palpitations and pain   . Gabapentin Other (See Comments)    Insomnia   . Sulfa Drugs Cross Reactors Nausea And Vomiting  . Tramadol Nausea And Vomiting    Medications:  Prior to Admission medications   Medication Sig Start Date End Date Taking? Authorizing Provider  acetaminophen (TYLENOL) 500 MG tablet Take 1,000 mg by mouth at bedtime.   Yes [provider]  albuterol (VENTOLIN HFA) 108 (90 Base) MCG/ACT inhaler Inhale 2 puffs into the lungs every 6 (six) hours as  needed for wheezing or shortness of breath.   Yes [provider]  atorvastatin (LIPITOR) 20 MG tablet Take 20 mg by mouth daily.   Yes [provider]  Calcium Carb-Cholecalciferol (CALCIUM 600 + D PO) Take 2 tablets by mouth daily.   Yes [provider]  Carbidopa-Levodopa ER (SINEMET CR) 25-100 MG tablet controlled release TAKE 1 TABLET BY MOUTH IN THE MORNING, AT NOON, AND AT BEDTIME. 01/26/20  Yes York Spaniel, MD  diphenhydrAMINE (BENADRYL) 50 MG capsule Take 50 mg by mouth See admin instructions. Take 50 mg 1 hour prior to procedure   Yes [provider]  montelukast (SINGULAIR) 10 MG tablet Take 1 tablet (10 mg total) by mouth at bedtime. 06/28/16  Yes Padgett, Pilar Grammes, MD  Omega-3 Fatty Acids (FISH OIL) 1200 MG CAPS Take 2,400 mg by mouth daily.   Yes [provider]  Potassium 99 MG TABS Take 99  mg by mouth daily.   Yes [provider]  predniSONE (DELTASONE) 50 MG tablet Take 50 mg by mouth See admin instructions. Take 50 mg 13 hours prior , 7 hours prior, and 1 hour prior to procedure 02/14/20  Yes [provider]  pyridOXINE (VITAMIN B-6) 100 MG tablet Take 100 mg by mouth daily.   Yes [provider]  vitamin B-12 (CYANOCOBALAMIN) 1000 MCG tablet Take 1,000 mcg by mouth daily.   Yes [provider]  Zinc Oxide-Vitamin C (ZINC PLUS VITAMIN C PO) Take 1 tablet by mouth daily.   Yes [provider]    ROS:  Out of a complete 14 system review of symptoms, the patient complains only of the following symptoms, and all other reviewed systems are negative.  Walking difficulty Tremor Constipation  Blood pressure (!) 149/93, pulse 81, height 5\' 6"  (1.676 m), weight 173 lb (78.5 kg).  Physical Exam  General: The patient is alert and cooperative at the time of the examination.  Skin: No significant peripheral edema is noted.   Neurologic Exam  Mental status: The patient is alert and oriented x 3 at the time of the examination. The patient has apparent normal recent and remote memory, with an apparently normal attention span and concentration ability.   Cranial nerves: Facial symmetry is present. Speech is normal, no aphasia or dysarthria is noted. Extraocular movements are full. Visual fields are full.  Mild masking the face is seen.  Motor: The patient has good strength in all 4 extremities, with exception about left-sided foot drop.  Sensory examination: Soft touch sensation is symmetric on the face, arms, and legs.  Coordination: The patient has good finger-nose-finger and heel-to-shin bilaterally.  Prominent tremors are seen in the left upper extremity.  Gait and station: The patient is able to arise from a seated position with arms crossed, once up, she can walk independently but the left arm is held in flexion with prominent  tremors.  Gait is slightly wide-based.  Tandem gait is minimally unsteady.  Romberg is negative.  Reflexes: Deep tendon reflexes are symmetric.   Assessment/Plan:  1.  Parkinson's disease  2.  Left foot drop  3.  Neck pain, left arm discomfort  Recent cervical myelogram did not show an etiology for her left arm and shoulder discomfort.  The patient has a lot of stiffness of the left arm, she holds her arm in flexion with walking.  She is bothered by the tremors, they are keeping her awake at night.  She could not tolerate Benadryl as  this resulted in nervousness and hyperactivity.  The patient will be placed on low-dose Cogentin taking 0.5 mg 3 times daily, but the patient may not be able to tolerate this due to constipation.  She will call me in about 4 weeks, will plan on going up on the Sinemet at that time.  The patient otherwise will follow up in 5 months.  Jill Alexanders MD 02/29/2020 7:46 AM  [facility] [address] [place], [place] [address]  Phone [phone] Fax [phone]

## 2020-02-29 NOTE — Patient Instructions (Signed)
We will start cogentin for the tremor. Look out for constipation.

## 2020-03-01 DIAGNOSIS — Z6827 Body mass index (BMI) 27.0-27.9, adult: Secondary | ICD-10-CM | POA: Diagnosis not present

## 2020-03-01 DIAGNOSIS — K5900 Constipation, unspecified: Secondary | ICD-10-CM | POA: Diagnosis not present

## 2020-03-01 DIAGNOSIS — M1990 Unspecified osteoarthritis, unspecified site: Secondary | ICD-10-CM | POA: Diagnosis not present

## 2020-03-01 DIAGNOSIS — E7800 Pure hypercholesterolemia, unspecified: Secondary | ICD-10-CM | POA: Diagnosis not present

## 2020-03-01 DIAGNOSIS — G20 Parkinson's disease: Secondary | ICD-10-CM | POA: Diagnosis not present

## 2020-03-01 DIAGNOSIS — Z0001 Encounter for general adult medical examination with abnormal findings: Secondary | ICD-10-CM | POA: Diagnosis not present

## 2020-03-02 DIAGNOSIS — M25579 Pain in unspecified ankle and joints of unspecified foot: Secondary | ICD-10-CM | POA: Diagnosis not present

## 2020-03-02 DIAGNOSIS — G5603 Carpal tunnel syndrome, bilateral upper limbs: Secondary | ICD-10-CM | POA: Diagnosis not present

## 2020-03-02 DIAGNOSIS — M25552 Pain in left hip: Secondary | ICD-10-CM | POA: Diagnosis not present

## 2020-03-02 DIAGNOSIS — M542 Cervicalgia: Secondary | ICD-10-CM | POA: Diagnosis not present

## 2020-03-02 DIAGNOSIS — M545 Low back pain, unspecified: Secondary | ICD-10-CM | POA: Diagnosis not present

## 2020-03-02 DIAGNOSIS — M5412 Radiculopathy, cervical region: Secondary | ICD-10-CM | POA: Diagnosis not present

## 2020-03-02 DIAGNOSIS — R269 Unspecified abnormalities of gait and mobility: Secondary | ICD-10-CM | POA: Diagnosis not present

## 2020-03-02 DIAGNOSIS — M79602 Pain in left arm: Secondary | ICD-10-CM | POA: Diagnosis not present

## 2020-03-22 ENCOUNTER — Other Ambulatory Visit: Payer: Self-pay | Admitting: Neurology

## 2020-03-25 DIAGNOSIS — I10 Essential (primary) hypertension: Secondary | ICD-10-CM | POA: Diagnosis not present

## 2020-03-25 DIAGNOSIS — M1711 Unilateral primary osteoarthritis, right knee: Secondary | ICD-10-CM | POA: Diagnosis not present

## 2020-03-25 DIAGNOSIS — E7849 Other hyperlipidemia: Secondary | ICD-10-CM | POA: Diagnosis not present

## 2020-03-25 DIAGNOSIS — G20 Parkinson's disease: Secondary | ICD-10-CM | POA: Diagnosis not present

## 2020-03-31 ENCOUNTER — Telehealth: Payer: Self-pay | Admitting: Neurology

## 2020-03-31 NOTE — Telephone Encounter (Signed)
Pt. states the 30 day supply of benztropine (COGENTIN) 0.5 MG tablet has worked & is requesting a 90 day supply of medication as doctor suggested that she try.  [phone]

## 2020-04-03 ENCOUNTER — Other Ambulatory Visit: Payer: Self-pay | Admitting: Emergency Medicine

## 2020-04-03 MED ORDER — BENZTROPINE MESYLATE 0.5 MG PO TABS: 0.5000 mg | ORAL_TABLET | Freq: Three times a day (TID) | ORAL | 1 refills | Status: DC

## 2020-04-03 NOTE — Telephone Encounter (Signed)
This was done today 

## 2020-04-10 DIAGNOSIS — E7801 Familial hypercholesterolemia: Secondary | ICD-10-CM | POA: Diagnosis not present

## 2020-04-10 DIAGNOSIS — M1990 Unspecified osteoarthritis, unspecified site: Secondary | ICD-10-CM | POA: Diagnosis not present

## 2020-04-10 DIAGNOSIS — G20 Parkinson's disease: Secondary | ICD-10-CM | POA: Diagnosis not present

## 2020-04-10 DIAGNOSIS — K5900 Constipation, unspecified: Secondary | ICD-10-CM | POA: Diagnosis not present

## 2020-04-10 DIAGNOSIS — Z6827 Body mass index (BMI) 27.0-27.9, adult: Secondary | ICD-10-CM | POA: Diagnosis not present

## 2020-04-11 DIAGNOSIS — Z23 Encounter for immunization: Secondary | ICD-10-CM | POA: Diagnosis not present

## 2020-04-24 DIAGNOSIS — G20 Parkinson's disease: Secondary | ICD-10-CM | POA: Diagnosis not present

## 2020-04-24 DIAGNOSIS — I10 Essential (primary) hypertension: Secondary | ICD-10-CM | POA: Diagnosis not present

## 2020-04-24 DIAGNOSIS — E7849 Other hyperlipidemia: Secondary | ICD-10-CM | POA: Diagnosis not present

## 2020-04-24 DIAGNOSIS — M1711 Unilateral primary osteoarthritis, right knee: Secondary | ICD-10-CM | POA: Diagnosis not present

## 2020-05-05 ENCOUNTER — Other Ambulatory Visit: Payer: Self-pay | Admitting: Neurology

## 2020-05-10 ENCOUNTER — Telehealth: Payer: Self-pay | Admitting: Neurology

## 2020-05-10 MED ORDER — BENZTROPINE MESYLATE 0.5 MG PO TABS: 0.5000 mg | ORAL_TABLET | Freq: Three times a day (TID) | ORAL | 1 refills | Status: DC

## 2020-05-10 NOTE — Telephone Encounter (Signed)
Tobi Bastos from [facility] called to put in a refill request for the pt's benztropine (COGENTIN) 0.5 MG tablet Please send to the [facility] [phone]

## 2020-05-10 NOTE — Telephone Encounter (Signed)
E-scribed refill to [facility] as requested.

## 2020-05-16 ENCOUNTER — Other Ambulatory Visit: Payer: Self-pay | Admitting: *Deleted

## 2020-05-16 MED ORDER — CARBIDOPA-LEVODOPA ER 25-100 MG PO TBCR: 1.0000 | EXTENDED_RELEASE_TABLET | Freq: Three times a day (TID) | ORAL | 3 refills | Status: DC

## 2020-05-19 DIAGNOSIS — Z1231 Encounter for screening mammogram for malignant neoplasm of breast: Secondary | ICD-10-CM | POA: Diagnosis not present

## 2020-05-24 DIAGNOSIS — I10 Essential (primary) hypertension: Secondary | ICD-10-CM | POA: Diagnosis not present

## 2020-05-24 DIAGNOSIS — M1711 Unilateral primary osteoarthritis, right knee: Secondary | ICD-10-CM | POA: Diagnosis not present

## 2020-05-24 DIAGNOSIS — E7849 Other hyperlipidemia: Secondary | ICD-10-CM | POA: Diagnosis not present

## 2020-05-24 DIAGNOSIS — G20 Parkinson's disease: Secondary | ICD-10-CM | POA: Diagnosis not present

## 2020-05-29 DIAGNOSIS — Z6827 Body mass index (BMI) 27.0-27.9, adult: Secondary | ICD-10-CM | POA: Diagnosis not present

## 2020-05-29 DIAGNOSIS — M25519 Pain in unspecified shoulder: Secondary | ICD-10-CM | POA: Diagnosis not present

## 2020-06-24 DIAGNOSIS — M1711 Unilateral primary osteoarthritis, right knee: Secondary | ICD-10-CM | POA: Diagnosis not present

## 2020-06-24 DIAGNOSIS — E7849 Other hyperlipidemia: Secondary | ICD-10-CM | POA: Diagnosis not present

## 2020-06-24 DIAGNOSIS — G20 Parkinson's disease: Secondary | ICD-10-CM | POA: Diagnosis not present

## 2020-06-24 DIAGNOSIS — I10 Essential (primary) hypertension: Secondary | ICD-10-CM | POA: Diagnosis not present

## 2020-07-10 DIAGNOSIS — M25519 Pain in unspecified shoulder: Secondary | ICD-10-CM | POA: Diagnosis not present

## 2020-07-10 DIAGNOSIS — Z6827 Body mass index (BMI) 27.0-27.9, adult: Secondary | ICD-10-CM | POA: Diagnosis not present

## 2020-07-24 DIAGNOSIS — E7849 Other hyperlipidemia: Secondary | ICD-10-CM | POA: Diagnosis not present

## 2020-07-24 DIAGNOSIS — M1711 Unilateral primary osteoarthritis, right knee: Secondary | ICD-10-CM | POA: Diagnosis not present

## 2020-07-24 DIAGNOSIS — I10 Essential (primary) hypertension: Secondary | ICD-10-CM | POA: Diagnosis not present

## 2020-07-28 DIAGNOSIS — Z6827 Body mass index (BMI) 27.0-27.9, adult: Secondary | ICD-10-CM | POA: Diagnosis not present

## 2020-07-28 DIAGNOSIS — M19011 Primary osteoarthritis, right shoulder: Secondary | ICD-10-CM | POA: Diagnosis not present

## 2020-07-31 ENCOUNTER — Encounter: Payer: Self-pay | Admitting: Neurology

## 2020-07-31 ENCOUNTER — Ambulatory Visit (INDEPENDENT_AMBULATORY_CARE_PROVIDER_SITE_OTHER): Payer: Medicare HMO | Admitting: Neurology

## 2020-07-31 VITALS — BP 131/99 | HR 99 | Ht 67.0 in | Wt 173.6 lb

## 2020-07-31 DIAGNOSIS — G2581 Restless legs syndrome: Secondary | ICD-10-CM

## 2020-07-31 DIAGNOSIS — G20 Parkinson's disease: Secondary | ICD-10-CM | POA: Diagnosis not present

## 2020-07-31 MED ORDER — ROPINIROLE HCL 0.5 MG PO TABS: 0.5000 mg | ORAL_TABLET | Freq: Every day | ORAL | 1 refills | Status: DC

## 2020-07-31 MED ORDER — CARBIDOPA-LEVODOPA ER 50-200 MG PO TBCR: 1.0000 | EXTENDED_RELEASE_TABLET | Freq: Two times a day (BID) | ORAL | 1 refills | Status: DC

## 2020-07-31 MED ORDER — BENZTROPINE MESYLATE 0.5 MG PO TABS: 0.5000 mg | ORAL_TABLET | Freq: Two times a day (BID) | ORAL | 1 refills | Status: DC

## 2020-07-31 NOTE — Progress Notes (Signed)
Reason for visit: Parkinson's disease, restless leg syndrome  [name] [name] is an 70 y.o. female  History of present illness:  [name] [name] is a 70 year old right-handed black female with a history of Parkinson's disease primarily with left-sided features.  The patient has had gradual worsening of her symptoms over time, she is now having some tremor with the right upper extremity as well.  She has noted dystonia of the left foot with intorsion and plantar flexion.  This is affecting her ability to ambulate at times, she has not reported any falls however.  The patient is having some difficulty getting up out of a chair.  She is on Cogentin for the tremor taking 0.5 mg 3 times daily but she has noted some cognitive clouding on this medication.  The patient denies any freezing events.  She is having a lot of insomnia issues in part secondary to restless leg problems, but her tremors may also keep her awake.  The patient has had some arthritic issues with her shoulders, now her symptoms are worse on the right than the left, she is seeing an orthopedic surgeon in the near future.  She also has some difficulty with flexion of the fourth and fifth fingers of the left hand, she has hyperextension of the PIP joints.  Past Medical History:  Diagnosis Date  . Bursitis   . Cellulitis    arm  . Common peroneal neuropathy, left 04/27/2019  . Headache   . Hyperlipidemia   . Insomnia   . OA (osteoarthritis)   . Onychomycosis   . Osteoarthritis   . Parkinson disease (HCC) 03/23/2015  . RLS (restless legs syndrome) 03/23/2015  . Tenosynovitis     Past Surgical History:  Procedure Laterality Date  . TUBAL LIGATION      Family History  Problem Relation Age of Onset  . Diabetes type II Sister   . Asthma Sister   . Heart failure Mother   . Stroke Mother   . Prostate cancer Father   . Diabetes type II Brother   . Tremor Sister   . Diabetes type II Brother   . Cancer Brother        prostate  .  Cancer Brother        prostate  . Cancer Brother        prostate  . Allergic rhinitis Neg Hx   . Angioedema Neg Hx   . Atopy Neg Hx   . Eczema Neg Hx   . Immunodeficiency Neg Hx   . Urticaria Neg Hx     Social history:  reports that she has never smoked. She has never used smokeless tobacco. She reports that she does not drink alcohol and does not use drugs.    Allergies  Allergen Reactions  . Iodine Swelling  . Iohexol Swelling     Desc: FACIAL SWELLING and temporary blindness   . Penicillins Itching and Swelling  . Shellfish Allergy Swelling  . Caffeine Other (See Comments)    Palpitations and pain   . Gabapentin Other (See Comments)    Insomnia   . Sulfa Drugs Cross Reactors Nausea And Vomiting  . Tramadol Nausea And Vomiting    Medications:  Prior to Admission medications   Medication Sig Start Date End Date Taking? Authorizing Provider  acetaminophen (TYLENOL) 500 MG tablet Take 1,000 mg by mouth at bedtime.   Yes [provider]  albuterol (VENTOLIN HFA) 108 (90 Base) MCG/ACT inhaler Inhale 2 puffs into the  lungs every 6 (six) hours as needed for wheezing or shortness of breath.   Yes [provider]  amLODipine (NORVASC) 5 MG tablet Take 5 mg by mouth daily.   Yes [provider]  atorvastatin (LIPITOR) 20 MG tablet Take 20 mg by mouth daily.   Yes [provider]  benztropine (COGENTIN) 0.5 MG tablet Take 1 tablet (0.5 mg total) by mouth in the morning, at noon, and at bedtime. 05/10/20 11/06/20 Yes York Spaniel, MD  Calcium Carb-Cholecalciferol (CALCIUM 600 + D PO) Take 2 tablets by mouth daily.   Yes [provider]  Carbidopa-Levodopa ER (SINEMET CR) 25-100 MG tablet controlled release Take 1 tablet by mouth in the morning, at noon, and at bedtime. 05/16/20  Yes York Spaniel, MD  DULoxetine (CYMBALTA) 60 MG capsule Take 60 mg by mouth daily.   Yes [provider]  montelukast (SINGULAIR) 10 MG tablet  Take 1 tablet (10 mg total) by mouth at bedtime. 06/28/16  Yes Padgett, Pilar Grammes, MD  Omega-3 Fatty Acids (FISH OIL) 1200 MG CAPS Take 2,400 mg by mouth daily.   Yes [provider]  Potassium 99 MG TABS Take 99 mg by mouth daily.   Yes [provider]  pyridOXINE (VITAMIN B-6) 100 MG tablet Take 100 mg by mouth daily.   Yes [provider]  vitamin B-12 (CYANOCOBALAMIN) 1000 MCG tablet Take 1,000 mcg by mouth daily.   Yes [provider]  Zinc Oxide-Vitamin C (ZINC PLUS VITAMIN C PO) Take 1 tablet by mouth daily.   Yes [provider]    ROS:  Out of a complete 14 system review of symptoms, the patient complains only of the following symptoms, and all other reviewed systems are negative.  Shoulder pain Tremors Insomnia Walking difficulty  Blood pressure (!) 131/99, pulse 99, height 5\' 7"  (1.702 m), weight 173 lb 9.6 oz (78.7 kg).  Physical Exam  General: The patient is alert and cooperative at the time of the examination.  Skin: No significant peripheral edema is noted.   Neurologic Exam  Mental status: The patient is alert and oriented x 3 at the time of the examination. The patient has apparent normal recent and remote memory, with an apparently normal attention span and concentration ability.   Cranial nerves: Facial symmetry is present. Speech is normal, no aphasia or dysarthria is noted. Extraocular movements are full. Visual fields are full.  Masking of the face is seen.  Motor: The patient has good strength in all 4 extremities.  Sensory examination: Soft touch sensation is symmetric on the face, arms, and legs.  Coordination: The patient has good finger-nose-finger and heel-to-shin bilaterally.  Prominent tremors are noted on the left greater than right upper extremity.  Gait and station: The patient has the ability to arise from a seated position with arms crossed.  Once up, she is able to ambulate independently but  has flexion and decreased arm swing with the left arm, she has some stiffness of the left leg with walking.  There is some intorsion of the left foot.  Romberg is negative.  Reflexes: Deep tendon reflexes are symmetric.   Assessment/Plan:  1.  Parkinson's disease  2.  Gait disorder  3.  Restless leg syndrome  The Sinemet dosing will be increased, she will gradually phase in her dosing until she gets to 50/200 mg CR tablets, 3 times daily.  Requip will be added at night, 0.5 mg.  The patient will reduce the  Cogentin dose to 0.5 mg twice daily secondary to some cognitive clouding.  She will follow-up here in 4 months.  Marlan Palau MD 07/31/2020 8:18 AM  [facility] [address] [place], [place] [address]  Phone [phone] Fax [phone]

## 2020-07-31 NOTE — Patient Instructions (Signed)
We will increase the Sinemet (carbidopa) to 50/200 mg three times a day. Substitute a 50/200 mg tablet for a 25/100 mg tablet each week until at one three times a day.  Cut back on the cogentin (benztropine) to one twice a day.  Start requip 0.5 mg at night for restless legs.  Sinemet (carbidopa) may result in confusion or hallucinations, drowsiness, nausea, or dizziness. If any significant side effects are noted, please contact our office. Sinemet may not be well absorbed when taken with high protein meals, if tolerated it is best to take 30-45 minutes before you eat.

## 2020-08-01 DIAGNOSIS — Z23 Encounter for immunization: Secondary | ICD-10-CM | POA: Diagnosis not present

## 2020-08-01 DIAGNOSIS — M25519 Pain in unspecified shoulder: Secondary | ICD-10-CM | POA: Diagnosis not present

## 2020-08-01 DIAGNOSIS — Z6827 Body mass index (BMI) 27.0-27.9, adult: Secondary | ICD-10-CM | POA: Diagnosis not present

## 2020-08-03 DIAGNOSIS — M19011 Primary osteoarthritis, right shoulder: Secondary | ICD-10-CM | POA: Diagnosis not present

## 2020-08-08 DIAGNOSIS — M19011 Primary osteoarthritis, right shoulder: Secondary | ICD-10-CM | POA: Diagnosis not present

## 2020-08-10 DIAGNOSIS — M19011 Primary osteoarthritis, right shoulder: Secondary | ICD-10-CM | POA: Diagnosis not present

## 2020-08-14 DIAGNOSIS — M19011 Primary osteoarthritis, right shoulder: Secondary | ICD-10-CM | POA: Diagnosis not present

## 2020-08-16 DIAGNOSIS — M19011 Primary osteoarthritis, right shoulder: Secondary | ICD-10-CM | POA: Diagnosis not present

## 2020-08-22 DIAGNOSIS — M19011 Primary osteoarthritis, right shoulder: Secondary | ICD-10-CM | POA: Diagnosis not present

## 2020-08-24 DIAGNOSIS — M1711 Unilateral primary osteoarthritis, right knee: Secondary | ICD-10-CM | POA: Diagnosis not present

## 2020-08-24 DIAGNOSIS — E7849 Other hyperlipidemia: Secondary | ICD-10-CM | POA: Diagnosis not present

## 2020-08-24 DIAGNOSIS — M19011 Primary osteoarthritis, right shoulder: Secondary | ICD-10-CM | POA: Diagnosis not present

## 2020-08-24 DIAGNOSIS — I10 Essential (primary) hypertension: Secondary | ICD-10-CM | POA: Diagnosis not present

## 2020-08-29 DIAGNOSIS — I10 Essential (primary) hypertension: Secondary | ICD-10-CM | POA: Diagnosis not present

## 2020-08-29 DIAGNOSIS — Z1329 Encounter for screening for other suspected endocrine disorder: Secondary | ICD-10-CM | POA: Diagnosis not present

## 2020-08-29 DIAGNOSIS — Z1321 Encounter for screening for nutritional disorder: Secondary | ICD-10-CM | POA: Diagnosis not present

## 2020-08-29 DIAGNOSIS — E559 Vitamin D deficiency, unspecified: Secondary | ICD-10-CM | POA: Diagnosis not present

## 2020-08-29 DIAGNOSIS — E7849 Other hyperlipidemia: Secondary | ICD-10-CM | POA: Diagnosis not present

## 2020-08-29 DIAGNOSIS — E782 Mixed hyperlipidemia: Secondary | ICD-10-CM | POA: Diagnosis not present

## 2020-08-29 DIAGNOSIS — R5381 Other malaise: Secondary | ICD-10-CM | POA: Diagnosis not present

## 2020-08-29 DIAGNOSIS — K219 Gastro-esophageal reflux disease without esophagitis: Secondary | ICD-10-CM | POA: Diagnosis not present

## 2020-08-30 DIAGNOSIS — M19011 Primary osteoarthritis, right shoulder: Secondary | ICD-10-CM | POA: Diagnosis not present

## 2020-08-31 DIAGNOSIS — M545 Low back pain, unspecified: Secondary | ICD-10-CM | POA: Diagnosis not present

## 2020-08-31 DIAGNOSIS — M542 Cervicalgia: Secondary | ICD-10-CM | POA: Diagnosis not present

## 2020-08-31 DIAGNOSIS — M25552 Pain in left hip: Secondary | ICD-10-CM | POA: Diagnosis not present

## 2020-08-31 DIAGNOSIS — M25579 Pain in unspecified ankle and joints of unspecified foot: Secondary | ICD-10-CM | POA: Diagnosis not present

## 2020-08-31 DIAGNOSIS — M5412 Radiculopathy, cervical region: Secondary | ICD-10-CM | POA: Diagnosis not present

## 2020-08-31 DIAGNOSIS — R269 Unspecified abnormalities of gait and mobility: Secondary | ICD-10-CM | POA: Diagnosis not present

## 2020-08-31 DIAGNOSIS — M79602 Pain in left arm: Secondary | ICD-10-CM | POA: Diagnosis not present

## 2020-08-31 DIAGNOSIS — G5603 Carpal tunnel syndrome, bilateral upper limbs: Secondary | ICD-10-CM | POA: Diagnosis not present

## 2020-09-01 DIAGNOSIS — K5900 Constipation, unspecified: Secondary | ICD-10-CM | POA: Diagnosis not present

## 2020-09-01 DIAGNOSIS — M1990 Unspecified osteoarthritis, unspecified site: Secondary | ICD-10-CM | POA: Diagnosis not present

## 2020-09-01 DIAGNOSIS — M19011 Primary osteoarthritis, right shoulder: Secondary | ICD-10-CM | POA: Diagnosis not present

## 2020-09-01 DIAGNOSIS — M25519 Pain in unspecified shoulder: Secondary | ICD-10-CM | POA: Diagnosis not present

## 2020-09-01 DIAGNOSIS — E7801 Familial hypercholesterolemia: Secondary | ICD-10-CM | POA: Diagnosis not present

## 2020-09-01 DIAGNOSIS — G20 Parkinson's disease: Secondary | ICD-10-CM | POA: Diagnosis not present

## 2020-09-01 DIAGNOSIS — Z6828 Body mass index (BMI) 28.0-28.9, adult: Secondary | ICD-10-CM | POA: Diagnosis not present

## 2020-09-07 DIAGNOSIS — M19011 Primary osteoarthritis, right shoulder: Secondary | ICD-10-CM | POA: Diagnosis not present

## 2020-09-24 DIAGNOSIS — M1711 Unilateral primary osteoarthritis, right knee: Secondary | ICD-10-CM | POA: Diagnosis not present

## 2020-09-24 DIAGNOSIS — I10 Essential (primary) hypertension: Secondary | ICD-10-CM | POA: Diagnosis not present

## 2020-09-24 DIAGNOSIS — E7849 Other hyperlipidemia: Secondary | ICD-10-CM | POA: Diagnosis not present

## 2020-10-02 DIAGNOSIS — Z6827 Body mass index (BMI) 27.0-27.9, adult: Secondary | ICD-10-CM | POA: Diagnosis not present

## 2020-10-02 DIAGNOSIS — R21 Rash and other nonspecific skin eruption: Secondary | ICD-10-CM | POA: Diagnosis not present

## 2020-10-23 DIAGNOSIS — R1110 Vomiting, unspecified: Secondary | ICD-10-CM | POA: Diagnosis not present

## 2020-10-23 DIAGNOSIS — Z20828 Contact with and (suspected) exposure to other viral communicable diseases: Secondary | ICD-10-CM | POA: Diagnosis not present

## 2020-10-23 DIAGNOSIS — R110 Nausea: Secondary | ICD-10-CM | POA: Diagnosis not present

## 2020-10-25 DIAGNOSIS — I10 Essential (primary) hypertension: Secondary | ICD-10-CM | POA: Diagnosis not present

## 2020-10-25 DIAGNOSIS — E7849 Other hyperlipidemia: Secondary | ICD-10-CM | POA: Diagnosis not present

## 2020-10-25 DIAGNOSIS — M1711 Unilateral primary osteoarthritis, right knee: Secondary | ICD-10-CM | POA: Diagnosis not present

## 2020-11-02 ENCOUNTER — Telehealth: Payer: Self-pay | Admitting: Neurology

## 2020-11-02 MED ORDER — ROPINIROLE HCL 0.5 MG PO TABS: 0.5000 mg | ORAL_TABLET | Freq: Every day | ORAL | 1 refills | Status: DC

## 2020-11-02 NOTE — Telephone Encounter (Signed)
Refill sent as requested, [facility]

## 2020-11-02 NOTE — Telephone Encounter (Signed)
Pt request rOPINIRole (REQUIP) 0.5 MG tablet at [facility] (Now [facility]).

## 2020-11-22 ENCOUNTER — Telehealth: Payer: Self-pay | Admitting: Neurology

## 2020-11-22 MED ORDER — CARBIDOPA-LEVODOPA ER 50-200 MG PO TBCR: 1.0000 | EXTENDED_RELEASE_TABLET | Freq: Three times a day (TID) | ORAL | 1 refills | Status: DC

## 2020-11-22 NOTE — Telephone Encounter (Signed)
I contacted the pt to discuss further.   She sts she is currently taking Carb/Levo 25/100 mg tid. She reports she did not increase to the 50/200 mg tablets as discussed at last visit.  I reviewed the recommendation from 07/31/20 off note "We will increase the Sinemet (carbidopa) to 50/200 mg three times a day. Substitute a 50/200 mg tablet for a 25/100 mg tablet each week until at one three times a day"  Pt reports she thought the rx was to be changed but to 2 tablets twice a day of the 50/200mg ?   Rx sent from 07/31/20 states to take 50/200 mg bid. Will send to MD to review and clarify dosage.

## 2020-11-22 NOTE — Telephone Encounter (Signed)
Pt would like a call from RN to know if there has been a change made to her carbidopa-levodopa (SINEMET CR) 50-200 MG tablet

## 2020-11-22 NOTE — Telephone Encounter (Signed)
The patient was called, she was to go up on the Sinemet to the 50/200 mg CR tablets 3 times daily, she has not yet done this.  I have sent in another prescription.  Original prescription was written for twice a day which was an error.

## 2020-11-24 DIAGNOSIS — I10 Essential (primary) hypertension: Secondary | ICD-10-CM | POA: Diagnosis not present

## 2020-11-24 DIAGNOSIS — E7849 Other hyperlipidemia: Secondary | ICD-10-CM | POA: Diagnosis not present

## 2020-11-24 DIAGNOSIS — M1711 Unilateral primary osteoarthritis, right knee: Secondary | ICD-10-CM | POA: Diagnosis not present

## 2020-11-28 DIAGNOSIS — L821 Other seborrheic keratosis: Secondary | ICD-10-CM | POA: Diagnosis not present

## 2020-11-28 DIAGNOSIS — L814 Other melanin hyperpigmentation: Secondary | ICD-10-CM | POA: Diagnosis not present

## 2020-12-04 ENCOUNTER — Other Ambulatory Visit: Payer: Self-pay

## 2020-12-04 ENCOUNTER — Ambulatory Visit (INDEPENDENT_AMBULATORY_CARE_PROVIDER_SITE_OTHER): Payer: Medicare HMO | Admitting: Neurology

## 2020-12-04 ENCOUNTER — Encounter: Payer: Self-pay | Admitting: Neurology

## 2020-12-04 VITALS — BP 119/78 | HR 84 | Ht 67.0 in | Wt 176.6 lb

## 2020-12-04 DIAGNOSIS — G2581 Restless legs syndrome: Secondary | ICD-10-CM

## 2020-12-04 DIAGNOSIS — G20 Parkinson's disease: Secondary | ICD-10-CM | POA: Diagnosis not present

## 2020-12-04 MED ORDER — ROPINIROLE HCL 1 MG PO TABS: 1.0000 mg | ORAL_TABLET | Freq: Three times a day (TID) | ORAL | 3 refills | Status: DC

## 2020-12-04 NOTE — Patient Instructions (Signed)
May consider [facility] therapy for Parkinson's disease.  With the Requip (ropinerole) 0.5 mg tablets, take one three times a day for 3 weeks, then we will convert to the 1 mg tablets three times a day.

## 2020-12-04 NOTE — Progress Notes (Signed)
Reason for visit: Parkinson's disease  [name] [name] is an 70 y.o. female  History of present illness:  [name] [name] is a 70 year old right-handed black female with a history of Parkinson's disease primarily with left-sided features.  She has restless leg syndrome as well.  She has had some dystonic posturing of the left foot and will have cramps involving the toes on that foot.  The patient is having some low back pain with standing associated with postural changes.  The patient goes to the [facility] and uses a treadmill and she can walk for 30 minutes or more without any back pain.  The patient still has some difficulty sleeping at night.  She denies any fatigue during the day.  She tries to stay active.  She does not have any wearing off effects of her medication.  She does feel somewhat staggery at times, she reports no falls.  She has difficulty walking in crowds.  She returns for an evaluation.  She indicates that the low-dose Requip as helped her restless leg syndrome significantly.  Past Medical History:  Diagnosis Date   Bursitis    Cellulitis    arm   Common peroneal neuropathy, left 04/27/2019   Headache    Hyperlipidemia    Insomnia    OA (osteoarthritis)    Onychomycosis    Osteoarthritis    Parkinson disease (HCC) 03/23/2015   RLS (restless legs syndrome) 03/23/2015   Tenosynovitis     Past Surgical History:  Procedure Laterality Date   TUBAL LIGATION      Family History  Problem Relation Age of Onset   Diabetes type II Sister    Asthma Sister    Heart failure Mother    Stroke Mother    Prostate cancer Father    Diabetes type II Brother    Tremor Sister    Diabetes type II Brother    Cancer Brother        prostate   Cancer Brother        prostate   Cancer Brother        prostate   Allergic rhinitis Neg Hx    Angioedema Neg Hx    Atopy Neg Hx    Eczema Neg Hx    Immunodeficiency Neg Hx    Urticaria Neg Hx     Social history:  reports that she has never  smoked. She has never used smokeless tobacco. She reports that she does not drink alcohol and does not use drugs.    Allergies  Allergen Reactions   Iodine Swelling   Iohexol Swelling     Desc: FACIAL SWELLING and temporary blindness    Penicillins Itching and Swelling   Shellfish Allergy Swelling   Caffeine Other (See Comments)    Palpitations and pain    Gabapentin Other (See Comments)    Insomnia    Sulfa Drugs Cross Reactors Nausea And Vomiting   Tramadol Nausea And Vomiting    Medications:  Prior to Admission medications   Medication Sig Start Date End Date Taking? Authorizing Provider  acetaminophen (TYLENOL) 500 MG tablet Take 1,000 mg by mouth at bedtime.   Yes [provider]  albuterol (VENTOLIN HFA) 108 (90 Base) MCG/ACT inhaler Inhale 2 puffs into the lungs every 6 (six) hours as needed for wheezing or shortness of breath.   Yes [provider]  amLODipine (NORVASC) 2.5 MG tablet 2.5 mg daily.   Yes [provider]  atorvastatin (LIPITOR) 20 MG tablet Take  20 mg by mouth daily.   Yes [provider]  benztropine (COGENTIN) 0.5 MG tablet Take 1 tablet (0.5 mg total) by mouth 2 (two) times daily. 07/31/20 01/27/21 Yes York Spaniel, MD  Calcium Carb-Cholecalciferol (CALCIUM 600 + D PO) Take 2 tablets by mouth daily.   Yes [provider]  carbidopa-levodopa (SINEMET CR) 50-200 MG tablet Take 1 tablet by mouth 3 (three) times daily. 11/22/20  Yes York Spaniel, MD  Omega-3 Fatty Acids (FISH OIL) 1200 MG CAPS Take 2,400 mg by mouth daily.   Yes [provider]  Potassium 99 MG TABS Take 99 mg by mouth daily.   Yes [provider]  pyridOXINE (VITAMIN B-6) 100 MG tablet Take 100 mg by mouth daily.   Yes [provider]  rOPINIRole (REQUIP) 0.5 MG tablet Take 1 tablet (0.5 mg total) by mouth at bedtime. 11/02/20  Yes York Spaniel, MD  vitamin B-12 (CYANOCOBALAMIN) 1000 MCG tablet Take 1,000 mcg by  mouth daily.   Yes [provider]  Zinc Oxide-Vitamin C (ZINC PLUS VITAMIN C PO) Take 1 tablet by mouth daily.   Yes [provider]    ROS:  Out of a complete 14 system review of symptoms, the patient complains only of the following symptoms, and all other reviewed systems are negative.  Walking difficulty Tremor  Blood pressure 119/78, pulse 84, height 5\' 7"  (1.702 m), weight 176 lb 9.6 oz (80.1 kg).  Physical Exam  General: The patient is alert and cooperative at the time of the examination.  Skin: No significant peripheral edema is noted.   Neurologic Exam  Mental status: The patient is alert and oriented x 3 at the time of the examination. The patient has apparent normal recent and remote memory, with an apparently normal attention span and concentration ability.   Cranial nerves: Facial symmetry is present. Speech is normal, no aphasia or dysarthria is noted. Extraocular movements are full. Visual fields are full.  Mild masking the face is seen.  Motor: The patient has good strength in all 4 extremities.  Sensory examination: Soft touch sensation is symmetric on the face, arms, and legs.  Coordination: The patient has good finger-nose-finger and heel-to-shin bilaterally.  A resting tremor with the left upper extremity is noted.  Gait and station: The patient is able to stand from a seated position with arms crossed.  Once up, she is able to ambulate independently.  She has a slightly wide-based gait.  Some rigidity with the left lower extremity.  She has some slight shuffling with turns, stooped posture.  She is able to perform tandem gait.  Romberg is negative.  Reflexes: Deep tendon reflexes are symmetric.   Assessment/Plan:  1.  Parkinson's disease  2.  Restless leg syndrome  The patient will continue the Cogentin, and her current dose of Sinemet.  We will go up on the Requip taking 0.5 mg 3 times daily for 3 weeks and then take 1 mg 3 times  daily.  She we will follow-up here in 4 to 5 months, she can be followed through Dr. in the future.  Frances Furbish MD 12/04/2020 3:04 PM  [facility] [address] [place], [place]  Phone [phone] Fax [phone]

## 2020-12-20 DIAGNOSIS — K5900 Constipation, unspecified: Secondary | ICD-10-CM | POA: Diagnosis not present

## 2020-12-20 DIAGNOSIS — M1990 Unspecified osteoarthritis, unspecified site: Secondary | ICD-10-CM | POA: Diagnosis not present

## 2020-12-20 DIAGNOSIS — M25519 Pain in unspecified shoulder: Secondary | ICD-10-CM | POA: Diagnosis not present

## 2020-12-20 DIAGNOSIS — E7849 Other hyperlipidemia: Secondary | ICD-10-CM | POA: Diagnosis not present

## 2020-12-20 DIAGNOSIS — Z23 Encounter for immunization: Secondary | ICD-10-CM | POA: Diagnosis not present

## 2020-12-20 DIAGNOSIS — E7801 Familial hypercholesterolemia: Secondary | ICD-10-CM | POA: Diagnosis not present

## 2020-12-20 DIAGNOSIS — I10 Essential (primary) hypertension: Secondary | ICD-10-CM | POA: Diagnosis not present

## 2020-12-20 DIAGNOSIS — G20 Parkinson's disease: Secondary | ICD-10-CM | POA: Diagnosis not present

## 2020-12-25 DIAGNOSIS — M1711 Unilateral primary osteoarthritis, right knee: Secondary | ICD-10-CM | POA: Diagnosis not present

## 2020-12-25 DIAGNOSIS — E7849 Other hyperlipidemia: Secondary | ICD-10-CM | POA: Diagnosis not present

## 2020-12-25 DIAGNOSIS — I10 Essential (primary) hypertension: Secondary | ICD-10-CM | POA: Diagnosis not present

## 2021-01-05 DIAGNOSIS — Z20828 Contact with and (suspected) exposure to other viral communicable diseases: Secondary | ICD-10-CM | POA: Diagnosis not present

## 2021-01-05 DIAGNOSIS — J101 Influenza due to other identified influenza virus with other respiratory manifestations: Secondary | ICD-10-CM | POA: Diagnosis not present

## 2021-01-24 DIAGNOSIS — E7849 Other hyperlipidemia: Secondary | ICD-10-CM | POA: Diagnosis not present

## 2021-01-24 DIAGNOSIS — M1711 Unilateral primary osteoarthritis, right knee: Secondary | ICD-10-CM | POA: Diagnosis not present

## 2021-01-24 DIAGNOSIS — I10 Essential (primary) hypertension: Secondary | ICD-10-CM | POA: Diagnosis not present

## 2021-02-01 ENCOUNTER — Telehealth: Payer: Self-pay | Admitting: Neurology

## 2021-02-01 NOTE — Telephone Encounter (Signed)
LMVM for pt that returned call.  °

## 2021-02-01 NOTE — Telephone Encounter (Signed)
Pt called back and she realized now that the new bottle she has gotten is the increased dose of 1mg  po tid, she has been using the 0.5mg  po tid.  She will try this and see how she does will call back if needed after 2 wks if still having problem.  She appreciated call back.

## 2021-02-01 NOTE — Telephone Encounter (Signed)
Pt called she thinks the rOPINIRole (REQUIP) 1 MG tablet is not helping her. Last night she did not get any rest due to her legs hurting. Pt requesting a call back.

## 2021-02-08 DIAGNOSIS — Z01419 Encounter for gynecological examination (general) (routine) without abnormal findings: Secondary | ICD-10-CM | POA: Diagnosis not present

## 2021-02-08 DIAGNOSIS — Z6828 Body mass index (BMI) 28.0-28.9, adult: Secondary | ICD-10-CM | POA: Diagnosis not present

## 2021-02-23 DIAGNOSIS — M1711 Unilateral primary osteoarthritis, right knee: Secondary | ICD-10-CM | POA: Diagnosis not present

## 2021-02-23 DIAGNOSIS — E7849 Other hyperlipidemia: Secondary | ICD-10-CM | POA: Diagnosis not present

## 2021-02-23 DIAGNOSIS — I10 Essential (primary) hypertension: Secondary | ICD-10-CM | POA: Diagnosis not present

## 2021-05-02 ENCOUNTER — Other Ambulatory Visit: Payer: Self-pay | Admitting: Neurology

## 2021-05-07 ENCOUNTER — Ambulatory Visit: Payer: Medicare HMO | Admitting: Neurology

## 2021-05-28 ENCOUNTER — Ambulatory Visit: Payer: Medicare HMO | Admitting: Neurology

## 2021-06-20 ENCOUNTER — Encounter: Payer: Self-pay | Admitting: Neurology

## 2021-06-20 ENCOUNTER — Ambulatory Visit (INDEPENDENT_AMBULATORY_CARE_PROVIDER_SITE_OTHER): Payer: Medicare Other | Admitting: Neurology

## 2021-06-20 VITALS — BP 138/91 | HR 81 | Ht 67.0 in | Wt 167.4 lb

## 2021-06-20 DIAGNOSIS — G20 Parkinson's disease: Secondary | ICD-10-CM

## 2021-06-20 DIAGNOSIS — R413 Other amnesia: Secondary | ICD-10-CM

## 2021-06-20 DIAGNOSIS — K5909 Other constipation: Secondary | ICD-10-CM

## 2021-06-20 MED ORDER — CARBIDOPA-LEVODOPA ER 50-200 MG PO TBCR: 1.0000 | EXTENDED_RELEASE_TABLET | Freq: Three times a day (TID) | ORAL | 1 refills | Status: DC

## 2021-06-20 MED ORDER — BENZTROPINE MESYLATE 0.5 MG PO TABS: 0.5000 mg | ORAL_TABLET | Freq: Two times a day (BID) | ORAL | 1 refills | Status: DC

## 2021-06-20 NOTE — Progress Notes (Signed)
Subjective:  ?  ?Patient ID: [name] [name] is a 71 y.o. female. ? ?HPI ? ? ? ?Interim history:  ? ?[name] [name] is a 71 year old right-handed woman with an underlying medical history of restless leg syndrome, Parkinson's disease, cellulitis, headaches, hyperlipidemia, osteoarthritis, bursitis and mildly overweight state, who presents for follow-up consultation of her Parkinson's disease.  She is accompanied by her husband today and presents for her 14-month checkup.  She previously followed with Dr. Margette Fast and was last seen by him on 12/04/2020.  I reviewed the note and copied the note below for reference.  She has been on Sinemet CR, Cogentin and ropinirole.  Her ropinirole was increased from 45 mg 3 times daily to 1 mg 3 times daily. ? ?Today, 06/20/2021: She reports feeling fairly stable, has issues with trigger finger on her left side and toe curling on the left side as well.  She has not fallen.  She has chronic constipation and takes Linzess for primary care, she is unsure about the dose.  She never reduced the Cogentin which was reduced from 3 times daily to twice daily in June 2022.  Per Dr. Tobey Grim documentation, she had cognitive clouding from the Cogentin and he reduced it in June 2022, started ropinirole and increased her levodopa.  She called back in December reporting worsening restless leg symptoms but realized also at the time that she had not increased her ropinirole to 1 mg 3 times daily from 1.5 mg 3 times daily.  She is currently taking her Parkinson's medications 3 times a day at 8 AM, 3 PM and 10 PM daily.  She works with a Clinical research associate twice a week.  She denies any significant cognitive issues but her husband reports that she has short-term memory loss, particularly forgetfulness about conversations.  She tries to hydrate well with water, she has had some peeling of the lips.  She has not had it looked at by a dermatologist.  She does not drink any alcohol and is a non-smoker and does  not drink caffeine daily.  She is no longer on amlodipine, was taken off by her PCP.  Symptoms of Parkinson's disease started with trembling and twitching of the left fingers.  She was diagnosed in 2017.  She denies a family history of Parkinson's disease.  She is retired, she was a Optometrist.  She lives with her husband, they have 3 grown children. ? ?The patient's allergies, current medications, family history, past medical history, past social history, past surgical history and problem list were reviewed and updated as appropriate.  ? ?Previously:  ? ?12/04/20 (Dr. Jannifer Franklin): <<Ms. Overbeck is a 71 year old right-handed black female with a history of Parkinson's disease primarily with left-sided features.  She has restless leg syndrome as well.  She has had some dystonic posturing of the left foot and will have cramps involving the toes on that foot.  The patient is having some low back pain with standing associated with postural changes.  The patient goes to the [facility] and uses a treadmill and she can walk for 30 minutes or more without any back pain.  The patient still has some difficulty sleeping at night.  She denies any fatigue during the day.  She tries to stay active.  She does not have any wearing off effects of her medication.  She does feel somewhat staggery at times, she reports no falls.  She has difficulty walking in crowds.  She returns for an evaluation.  She indicates that  the low-dose Requip as helped her restless leg syndrome significantly.>> ? ? ?Her Past Medical History Is Significant For: ?Past Medical History:  ?Diagnosis Date  ? Bursitis   ? Cellulitis   ? arm  ? Common peroneal neuropathy, left 04/27/2019  ? Headache   ? Hyperlipidemia   ? Insomnia   ? OA (osteoarthritis)   ? Onychomycosis   ? Osteoarthritis   ? Parkinson disease ([place]) 03/23/2015  ? RLS (restless legs syndrome) 03/23/2015  ? Tenosynovitis   ? ? ?Her Past Surgical History Is Significant For: ?Past Surgical History:   ?Procedure Laterality Date  ? CAROTID ENDARTERECTOMY    ? TUBAL LIGATION    ? ? ?Her Family History Is Significant For: ?Family History  ?Problem Relation Age of Onset  ? Heart failure Mother   ? Stroke Mother   ? Prostate cancer Father   ? Diabetes type II Sister   ? Asthma Sister   ? Tremor Sister   ? Diabetes type II Brother   ? Diabetes type II Brother   ? Cancer Brother   ?     prostate  ? Cancer Brother   ?     prostate  ? Cancer Brother   ?     prostate  ? Allergic rhinitis Neg Hx   ? Angioedema Neg Hx   ? Atopy Neg Hx   ? Eczema Neg Hx   ? Immunodeficiency Neg Hx   ? Parkinson's disease Neg Hx   ? ? ?Her Social History Is Significant For: ?Social History  ? ?Socioeconomic History  ? Marital status: Married  ?  Spouse name: roger  ? Number of children: Not on file  ? Years of education: bachelor  ? Highest education level: Not on file  ?Occupational History  ? Occupation: [employer]  ?  Comment: retired  ?Tobacco Use  ? Smoking status: Never  ? Smokeless tobacco: Never  ?Vaping Use  ? Vaping Use: Never used  ?Substance and Sexual Activity  ? Alcohol use: No  ? Drug use: No  ? Sexual activity: Not on file  ?  Comment: Married  ?Other Topics Concern  ? Not on file  ?Social History Narrative  ? Lives at home w/ her husband  ? Patient does not drink caffeine.   ? Patient is right handed.  ? ?Social Determinants of Health  ? ?Financial Resource Strain: Not on file  ?Food Insecurity: Not on file  ?Transportation Needs: Not on file  ?Physical Activity: Not on file  ?Stress: Not on file  ?Social Connections: Not on file  ? ? ?Her Allergies Are:  ?Allergies  ?Allergen Reactions  ? Iodine Swelling  ? Iohexol Swelling  ?   Desc: FACIAL SWELLING and temporary blindness   ? Penicillins Itching and Swelling  ? Shellfish Allergy Swelling  ? Caffeine Other (See Comments)  ?  Palpitations and pain ?  ? Gabapentin Other (See Comments)  ?  Insomnia   ? Sulfa Drugs Cross Reactors Nausea And Vomiting  ?  Tramadol Nausea And Vomiting  ?:  ? ?Her Current Medications Are:  ?Outpatient Encounter Medications as of 06/20/2021  ?Medication Sig  ? acetaminophen (TYLENOL) 500 MG tablet Take 1,000 mg by mouth at bedtime.  ? albuterol (VENTOLIN HFA) 108 (90 Base) MCG/ACT inhaler Inhale 2 puffs into the lungs every 6 (six) hours as needed for wheezing or shortness of breath.  ? atorvastatin (LIPITOR) 20 MG tablet Take 20 mg by mouth daily.  ?  Calcium Carb-Cholecalciferol (CALCIUM 600 + D PO) Take 2 tablets by mouth daily.  ? carbidopa-levodopa (SINEMET CR) 50-200 MG tablet Take 1 tablet by mouth 3 (three) times daily.  ? Omega-3 Fatty Acids (FISH OIL) 1200 MG CAPS Take 2,400 mg by mouth daily.  ? Potassium 99 MG TABS Take 99 mg by mouth daily.  ? pyridOXINE (VITAMIN B-6) 100 MG tablet Take 100 mg by mouth daily.  ? rOPINIRole (REQUIP) 1 MG tablet TAKE 1 TABLET BY MOUTH 3 TIMES DAILY.  ? vitamin B-12 (CYANOCOBALAMIN) 1000 MCG tablet Take 1,000 mcg by mouth daily.  ? Zinc Oxide-Vitamin C (ZINC PLUS VITAMIN C PO) Take 1 tablet by mouth daily.  ? amLODipine (NORVASC) 2.5 MG tablet 2.5 mg daily.  ? benztropine (COGENTIN) 0.5 MG tablet Take 1 tablet (0.5 mg total) by mouth 2 (two) times daily.  ? ?No facility-administered encounter medications on file as of 06/20/2021.  ?: ? ?Review of Systems:  ?Out of a complete 14 point review of systems, all are reviewed and negative with the exception of these symptoms as listed below: ? ? ? ?Review of Systems  ?Neurological:   ?     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 Pt states tremors are less   ? ?Objective:  ?Neurological Exam ? ?Physical Exam ?Physical Examination:  ? ?Vitals:  ? 06/20/21 1032  ?BP: (!) 138/91  ?Pulse: 81  ? ? ?General Examination: The patient is a very pleasant 71 y.o. female in no acute distress. He appears well-developed and well-nourished and well groomed.  ? ?HEENT: Normocephalic, atraumatic, pupils are  reactive, tracking fairly well-preserved, mild to moderate facial masking, mild to moderate nuchal rigidity, no facial tremor.  No obvious peeling of the lips noted.  Mild hypophonia, no dysarthria.  No carotid bru

## 2021-06-20 NOTE — Patient Instructions (Addendum)
It was nice to meet you both today.  As discussed, I agree with Dr. Tobey Grim plan from last year in June to decrease your Cogentin (generic name: Benztropine).  Please take 1 pill twice daily for now and we may taper it off in the future.   ?As you recall, he increased your ropinirole in October 2022 to 1 mg 3 times daily.  Please continue with this dose.  Please also continue with your long-acting levodopa at 1 pill 3 times daily.  We may increase this in the next few months to 1 pill 4 times daily.   ? ?Please follow-up routinely to see me in 3 to 4 months.  He may also see one of our nurse practitioners in the near future. ? ?Please continue to be proactive about constipation issues and try to stay active mentally and physically.  We will plan to do a memory questionnaire next time. ?

## 2021-10-05 IMAGING — CR DG CHEST 2V
2 series · 2 of 2 positions shown · non-contrast
Comparison: 05/03/2015

CLINICAL DATA: Vomiting and chest pain

EXAM:
CHEST - 2 VIEW

[w chest lat]
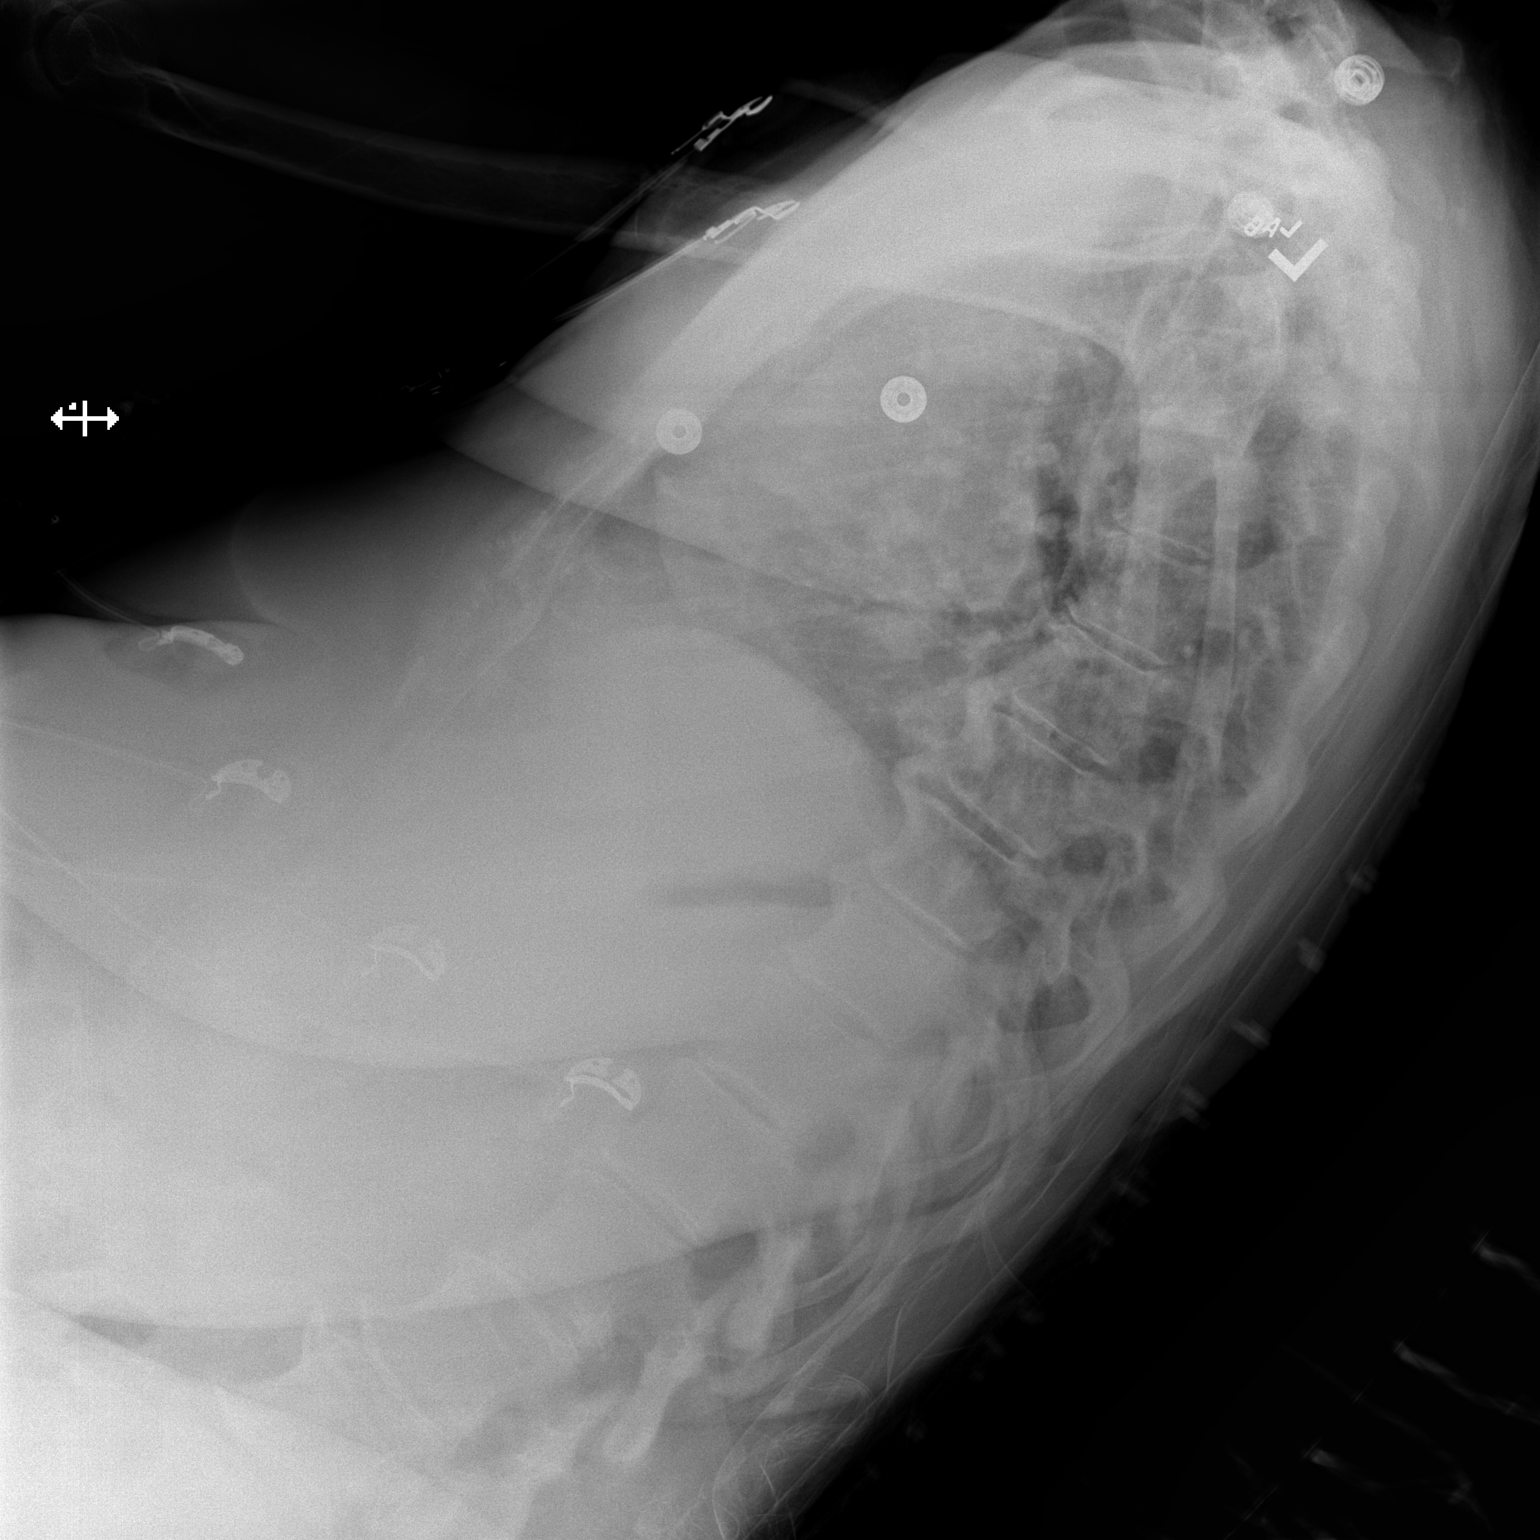

[x chest ap]
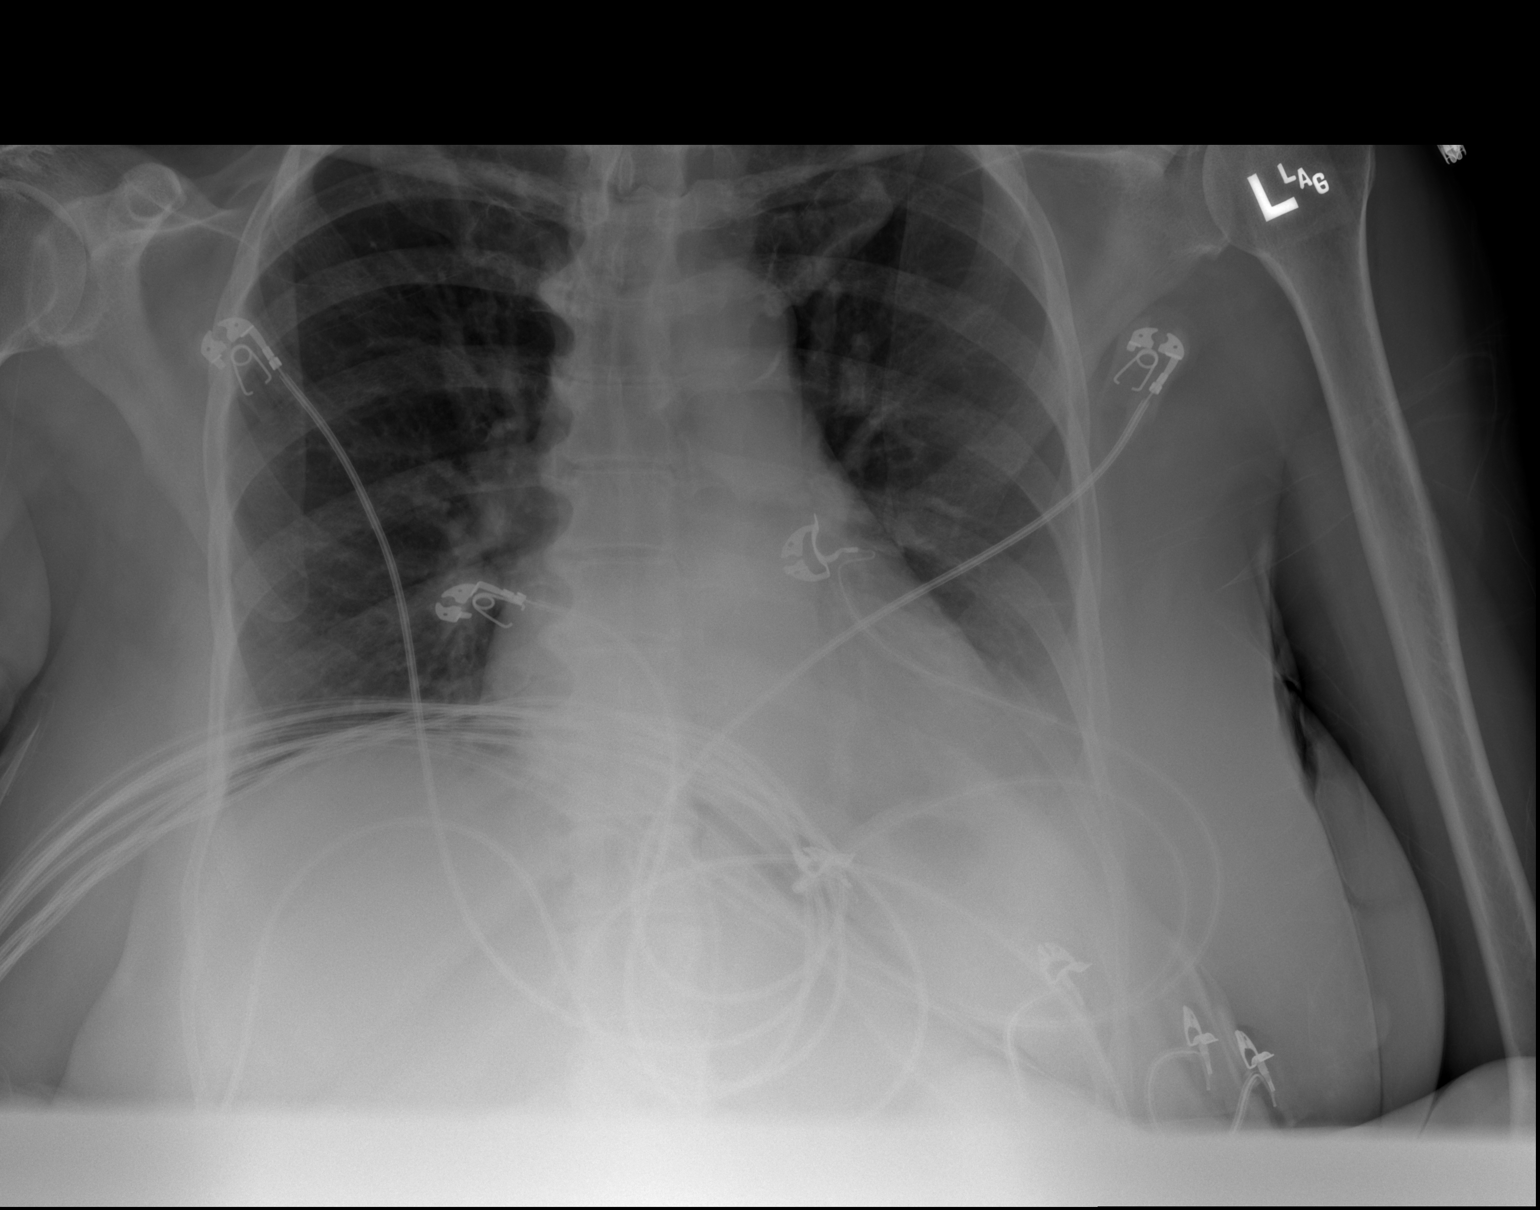

[2 of 2 positions shown; findings below may reference images not displayed]

FINDINGS: Artifact from EKG leads. Clear lungs when accounting for under
penetration. No effusion or pneumothorax. Normal heart size and
mediastinal contours.
IMPRESSION: Negative low volume chest.

## 2021-10-22 ENCOUNTER — Ambulatory Visit: Payer: Medicare Other | Admitting: Neurology

## 2021-10-24 ENCOUNTER — Ambulatory Visit: Payer: Medicare Other | Admitting: Neurology

## 2021-10-24 ENCOUNTER — Other Ambulatory Visit: Payer: Self-pay | Admitting: Neurology

## 2021-11-21 ENCOUNTER — Ambulatory Visit (INDEPENDENT_AMBULATORY_CARE_PROVIDER_SITE_OTHER): Payer: Medicare Other | Admitting: Neurology

## 2021-11-21 ENCOUNTER — Encounter: Payer: Self-pay | Admitting: Neurology

## 2021-11-21 ENCOUNTER — Telehealth: Payer: Self-pay | Admitting: Neurology

## 2021-11-21 VITALS — BP 160/90 | HR 77 | Ht 67.0 in | Wt 164.8 lb

## 2021-11-21 DIAGNOSIS — K5909 Other constipation: Secondary | ICD-10-CM | POA: Diagnosis not present

## 2021-11-21 DIAGNOSIS — R030 Elevated blood-pressure reading, without diagnosis of hypertension: Secondary | ICD-10-CM

## 2021-11-21 DIAGNOSIS — G20 Parkinson's disease: Secondary | ICD-10-CM

## 2021-11-21 DIAGNOSIS — R413 Other amnesia: Secondary | ICD-10-CM

## 2021-11-21 NOTE — Telephone Encounter (Signed)
Pt called stating that the provider informed her to check with her PCP regarding her BP. She states that she went to her PCP and her BP was 124/68

## 2021-11-21 NOTE — Telephone Encounter (Signed)
Ok great, thanks. Will update Dr Rexene Alberts.

## 2021-11-21 NOTE — Patient Instructions (Signed)
It was nice to see you again. Your exam is stable.  Please continue with your longacting Levodopa 3 times a day and the Ropinirole 3 times a day. Please reduce the Cogentin to 1 pill once daily in the evening for 2 to 4 weeks, then stop it altogether.  Continue to exercise regularly and hydrate well with water.  Please discuss your elevated blood pressure with your primary care.

## 2021-11-21 NOTE — Progress Notes (Signed)
Subjective:    Patient ID: [name] [name] is a 71 y.o. female.  HPI    Interim history:   [name] [name] is a 71 year old right-handed woman with an underlying medical history of restless leg syndrome, Parkinson's disease, cellulitis, headaches, hyperlipidemia, osteoarthritis, bursitis and mildly overweight state, who presents for follow-up consultation of her Parkinson's disease.  She is accompanied by her husband today and presents for her 79-monthcheckup.  I first met her on 06/20/21, at which time she was still taking Cogentin.  She was advised to reduce it as initially planned by Dr. WJannifer Franklin  She was advised to continue her long-acting generic Sinemet 3 times a day including ropinirole 1 mg 3 times daily.  We discussed a possible increase in her Sinemet CR to 1 pill 4 times a day in the near future.  She was advised that we will likely taper off the Cogentin altogether.  She was exercising with the help of a personal trainer.  Her husband had noticed short-term memory issues.  She was on Linzess for chronic constipation.  Today, 11/21/2021: She reports feeling stable. No issues when she reduced the Cogentin to 1 pill twice daily, she takes it morning and evening.  She takes her Sinemet CR 3 times a day as well as ropinirole 1 mg strength 3 times a day, typically at 8 AM, 3 PM and 10 PM daily.  Bedtime is around 11 PM.  She sleeps fairly well when her husband is home, he is a tAdministratorand his out of town a lot.  She has tried melatonin in the past, unclear of the dose but it did not seem to help.  She has not fallen, she exercises regularly and tries to hydrate well.  Husband has noticed short-term memory issues, she has a tendency to repeat herself.  She continues to take Linzess for chronic constipation.  She is willing to further reduce her Cogentin and come off of it altogether.  She has discomfort in the right knee and sometimes it pops.  Her balance is not as good.   The patient's allergies,  current medications, family history, past medical history, past social history, past surgical history and problem list were reviewed and updated as appropriate.    Previously:    12/04/20 (Dr. WJannifer Franklin: <<Ms. SBohlenis a 71year old right-handed black female with a history of Parkinson's disease primarily with left-sided features.  She has restless leg syndrome as well.  She has had some dystonic posturing of the left foot and will have cramps involving the toes on that foot.  The patient is having some low back pain with standing associated with postural changes.  [facility] and she can walk for 30 minutes or more without any back pain.  The patient still has some difficulty sleeping at night.  She denies any fatigue during the day.  She tries to stay active.  She does not have any wearing off effects of her medication.  She does feel somewhat staggery at times, she reports no falls.  She has difficulty walking in crowds.  She returns for an evaluation.  She indicates that the low-dose Requip as helped her restless leg syndrome significantly.>>   Her Past Medical History Is Significant For: Past Medical History:  Diagnosis Date   Bursitis    Cellulitis    arm   Common peroneal neuropathy, left 04/27/2019   Headache    Hyperlipidemia    Insomnia  OA (osteoarthritis)    Onychomycosis    Osteoarthritis    Parkinson disease ([place]) 03/23/2015   RLS (restless legs syndrome) 03/23/2015   Tenosynovitis     Her Past Surgical History Is Significant For: Past Surgical History:  Procedure Laterality Date   CAROTID ENDARTERECTOMY     TUBAL LIGATION      Her Family History Is Significant For: Family History  Problem Relation Age of Onset   Heart failure Mother    Stroke Mother    Prostate cancer Father    Diabetes type II Sister    Asthma Sister    Tremor Sister    Diabetes type II Brother    Diabetes type II Brother    Cancer Brother        prostate    Cancer Brother        prostate   Cancer Brother        prostate   Allergic rhinitis Neg Hx    Angioedema Neg Hx    Atopy Neg Hx    Eczema Neg Hx    Immunodeficiency Neg Hx    Parkinson's disease Neg Hx     Her Social History Is Significant For: Social History   Socioeconomic History   Marital status: Married    Spouse name: Dance movement psychotherapist   Number of children: Not on file   Years of education: bachelor   Highest education level: Not on file  Occupational History   Occupation: [employer]    Comment: retired  Tobacco Use   Smoking status: Never   Smokeless tobacco: Never  Scientific laboratory technician Use: Never used  Substance and Sexual Activity   Alcohol use: No   Drug use: No   Sexual activity: Not on file    Comment: Married  Other Topics Concern   Not on file  Social History Narrative   Lives at home w/ her husband   Patient does not drink caffeine.    Patient is right handed.   Social Determinants of Health   Financial Resource Strain: Not on file  Food Insecurity: Not on file  Transportation Needs: Not on file  Physical Activity: Not on file  Stress: Not on file  Social Connections: Not on file    Her Allergies Are:  Allergies  Allergen Reactions   Iodine Swelling   Iohexol Swelling     Desc: FACIAL SWELLING and temporary blindness    Penicillins Itching and Swelling   Shellfish Allergy Swelling   Caffeine Other (See Comments)    Palpitations and pain    Gabapentin Other (See Comments)    Insomnia    Sulfa Drugs Cross Reactors Nausea And Vomiting   Tramadol Nausea And Vomiting  :   Her Current Medications Are:  Outpatient Encounter Medications as of 11/21/2021  Medication Sig   acetaminophen (TYLENOL) 500 MG tablet Take 1,000 mg by mouth at bedtime.   albuterol (VENTOLIN HFA) 108 (90 Base) MCG/ACT inhaler Inhale 2 puffs into the lungs every 6 (six) hours as needed for wheezing or shortness of breath.   atorvastatin (LIPITOR) 20 MG  tablet Take 20 mg by mouth daily.   benztropine (COGENTIN) 0.5 MG tablet Take 1 tablet (0.5 mg total) by mouth 2 (two) times daily.   Calcium Carb-Cholecalciferol (CALCIUM 600 + D PO) Take 2 tablets by mouth daily.   carbidopa-levodopa (SINEMET CR) 50-200 MG tablet Take 1 tablet by mouth 3 (three) times daily.   LINZESS 145 MCG CAPS capsule Take  145 mcg by mouth daily.   Omega-3 Fatty Acids (FISH OIL) 1200 MG CAPS Take 2,400 mg by mouth daily.   Potassium 99 MG TABS Take 99 mg by mouth daily.   pyridOXINE (VITAMIN B-6) 100 MG tablet Take 100 mg by mouth daily.   rOPINIRole (REQUIP) 1 MG tablet TAKE 1 TABLET BY MOUTH THREE TIMES A DAY   vitamin B-12 (CYANOCOBALAMIN) 1000 MCG tablet Take 1,000 mcg by mouth daily.   Zinc Oxide-Vitamin C (ZINC PLUS VITAMIN C PO) Take 1 tablet by mouth daily.   amLODipine (NORVASC) 2.5 MG tablet 2.5 mg daily.   No facility-administered encounter medications on file as of 11/21/2021.  :  Review of Systems:  Out of a complete 14 point review of systems, all are reviewed and negative with the exception of these symptoms as listed below:  Review of Systems  Neurological:        Pt here for parkinson's f/u   Pt states balance is off  Pt states at times unsteady when walking . Pt states st times she drags her left foot when she walks     Objective:  Neurological Exam  Physical Exam Physical Examination:   Vitals:   11/21/21 0727  BP: (!) 160/90  Pulse: 77   Upon recheck her blood pressure at the end of the visit was 159/101 with a pulse of 68.  She has not had anything to eat or drink today except water.  She denies any lightheadedness or symptoms from elevated blood pressure but is concerned about it.  General Examination: The patient is a very pleasant 71 y.o. female in no acute distress. She appears well-developed and well-nourished and well groomed.   HEENT: Normocephalic, atraumatic, pupils are reactive, tracking fairly well-preserved, mild to  moderate facial masking, mild to moderate nuchal rigidity, no facial tremor.  No obvious peeling of the lips noted.  Mild hypophonia, no dysarthria.  No carotid bruits.  Mild mouth dryness noted.  Tongue protrudes centrally and palate elevates symmetrically.   Chest: Clear to auscultation without wheezing, rhonchi or crackles noted.   Heart: S1+S2+0, regular and normal without murmurs, rubs or gallops noted.    Abdomen: Soft, non-tender and non-distended.  Normal bowel sounds on auscultation.   Extremities: There is no obvious edema.     Skin: Warm and dry without trophic changes noted.    Musculoskeletal: exam reveals no obvious joint deformities, with the exception of right knee mildly wider than left and she does have some discomfort in her right knee.    Neurologically:  Mental status: The patient is awake, alert and oriented in all 4 spheres. Her immediate and remote memory, attention, language skills and fund of knowledge are mildly impaired.  She is able to provide her history, she does look to her husband at times and he chimes in.  Thought process is linear. Mood is normal and affect is normal.  Cranial nerves II - XII are as described above under HEENT exam.  Motor exam: Normal bulk, strength.  She has increased tone in both upper extremities with cogwheeling noted, more so on the left.  She has a mild to moderate resting tremor in the left upper extremity only.  On fine motor testing she has mild to moderate difficulty with finger taps, hand movements and rapid alternating patting on the left.  She has milder difficulty on the right.  Foot taps are moderately impaired on the left and better on the right.  She stands without difficulty  and posture is mildly stooped for age, stable.  She walks with decreased stride length and pace and decreased arm swing particularly on the left.  No shuffling noted.  No festination, no freezing spells.  Cerebellar testing: No dysmetria or intention  tremor. There is no truncal or gait ataxia.  Sensory exam: intact to light touch in the upper and lower extremities.    Assessment and Plan:    In summary, [name] [name] is a very pleasant 71 year old female with an underlying medical history of restless leg syndrome, Parkinson's disease, cellulitis, headaches, hyperlipidemia, osteoarthritis, bursitis and mildly overweight state, who presents for follow-up consultation of her left-sided predominant Parkinson's disease of several years' duration, symptoms dating back to 2017.  She has been on Sinemet CR 1 pill 3 times daily, ropinirole was supposed to be increased in October 2022, she increased it in December 2022 from 0.5 mg 3 times daily to 1 mg 3 times daily. In June 2022 she was advised to reduce her Cogentin to twice daily from 3 times daily, Dr. Jannifer Franklin felt that it caused cognitive clouding.  She eventually reduced her Cogentin in April 2023.  She is advised to reduce it to 1 pill once daily in the evening for the next 2 to 4 weeks and then stop it altogether.  She is advised to continue with her Sinemet CR 1 pill 3 times daily and ropinirole 1 mg 3 times daily for now.  We will plan to follow-up in this clinic in 3 to 4 months to see one of our nurse practitioners.  When she is off the Cogentin, we can plan a MMSE as well.  She has limited her driving.  She is advised to continue to exercise regularly and hydrate well with water.  She is doing well in this regard.  She is advised to try melatonin again, start with 5 mg an hour or 2 before bedtime, can go up to 10 mg.  She is advised to follow-up with her primary care regarding her elevated blood pressure values today.  She used to be on low-dose amlodipine but was taken off of it. I answered all their questions today and the patient and her husband were in agreement.

## 2021-12-11 ENCOUNTER — Telehealth: Payer: Self-pay | Admitting: Neurology

## 2021-12-11 NOTE — Telephone Encounter (Signed)
October 7 was viisiting stepped on porch suddenly started leaning to my left. Felt like I was falling in slow motion. [name] [name] 15 was in [place] and step on walkway in the parking lot and fell. No injuries, but right arm was sore. Would like a call from the nurse.Think maybe because weaning off of benztropine (COGENTIN) 0.5 MG tablet may have something to do with it.

## 2021-12-12 NOTE — Telephone Encounter (Signed)
I spoke with the patient.  She reduced her Cogentin 0.5 mg to 1 tablet every evening around September 27th or 28th right after she was seen in the office.  She states the week before her fall on October 7, she noticed her lips were peeling and she decided to drink more water and try to stay away from juice and watch her sugar.  She recalls feeling perhaps a little woozy some days beforehand and when she drank apple juice it helped her feel much better.  She wondered if perhaps those symptoms were due to a blood sugar drop.  She states she does not have diabetes. She had the fall October 7 where she just started leaning to the left.  She had a fall on Friday the 21YY at a concert.  She says she stepped up on a walkway and she may have tripped when she fell.  She states she has been feeling a little off balance/dizzy, a little weak.  She states is difficult to describe.  She is due to come off the Cogentin on October 24.  I did recommend that she primary care since she has made the changes in her diet even though she states she does not have Diabetes. I told her a message would be sent to Dr Rexene Alberts and I would call her back with her response. Pt was very appreciative.

## 2021-12-12 NOTE — Telephone Encounter (Signed)
Recommend following up with primary care as soon as possible, recommend coming off of Cogentin as planned.  Recommend staying well-hydrated standing up slowly, using a cane or walker for gait safety for now.

## 2021-12-12 NOTE — Telephone Encounter (Signed)
I spoke with the patient and I relayed the message directly from Dr. Rexene Alberts as noted below.  Patient verbalized understanding.  She will see if her primary care can send his note over to Dr. Rexene Alberts after she sees him.  She said thank you to Dr. Rexene Alberts.

## 2021-12-13 ENCOUNTER — Other Ambulatory Visit: Payer: Self-pay | Admitting: Neurology

## 2022-02-27 DIAGNOSIS — H81399 Other peripheral vertigo, unspecified ear: Secondary | ICD-10-CM | POA: Diagnosis not present

## 2022-02-27 DIAGNOSIS — M25561 Pain in right knee: Secondary | ICD-10-CM | POA: Diagnosis not present

## 2022-02-28 DIAGNOSIS — H43811 Vitreous degeneration, right eye: Secondary | ICD-10-CM | POA: Diagnosis not present

## 2022-02-28 DIAGNOSIS — H524 Presbyopia: Secondary | ICD-10-CM | POA: Diagnosis not present

## 2022-03-04 DIAGNOSIS — M810 Age-related osteoporosis without current pathological fracture: Secondary | ICD-10-CM | POA: Diagnosis not present

## 2022-03-04 DIAGNOSIS — Z780 Asymptomatic menopausal state: Secondary | ICD-10-CM | POA: Diagnosis not present

## 2022-03-05 DIAGNOSIS — M25561 Pain in right knee: Secondary | ICD-10-CM | POA: Diagnosis not present

## 2022-03-05 DIAGNOSIS — H81399 Other peripheral vertigo, unspecified ear: Secondary | ICD-10-CM | POA: Diagnosis not present

## 2022-03-08 DIAGNOSIS — H81399 Other peripheral vertigo, unspecified ear: Secondary | ICD-10-CM | POA: Diagnosis not present

## 2022-03-08 DIAGNOSIS — M25561 Pain in right knee: Secondary | ICD-10-CM | POA: Diagnosis not present

## 2022-03-11 DIAGNOSIS — M25561 Pain in right knee: Secondary | ICD-10-CM | POA: Diagnosis not present

## 2022-03-11 DIAGNOSIS — H81399 Other peripheral vertigo, unspecified ear: Secondary | ICD-10-CM | POA: Diagnosis not present

## 2022-03-11 NOTE — Progress Notes (Unsigned)
[facility] [address] [place]. [place] [address] [phone]       OFFICE FOLLOW UP NOTE  [name] [facility], [place] Date of Birth:  [birth year] Medical Record Number:  [id number]   Reason for visit:     SUBJECTIVE:   CHIEF COMPLAINT:  No chief complaint on file.   HPI:   [name] [name] is a 72 year old right-handed woman with [name] underlying medical history of restless leg syndrome, Parkinson's disease, cellulitis, headaches, hyperlipidemia, osteoarthritis, bursitis and mildly overweight state, who presents for follow-up consultation of her Parkinson's disease.  She was last seen on 11/21/2021 with Dr. Rexene Alberts. Taking Cogentin 1 tab BID, Sinemet CR 1 tab TID and ropinirole 1mg  TID.  At prior visit, discussed gradually tapering off Cogentin due to cognitive side effects and continued on same dosage of Sinemet and ropinirole.    Update 03/12/2022 JM: Patient returns for 62-month follow-up.          Update 11/21/2021 Dr. Rexene Alberts: She reports feeling stable. No issues when she reduced the Cogentin to 1 pill twice daily, she takes it morning and evening.  She takes her Sinemet CR 3 times a day as well as ropinirole 1 mg strength 3 times a day, typically at 8 AM, 3 PM and 10 PM daily.  Bedtime is around 11 PM.  She sleeps fairly well when her husband is home, he is a Administrator and his out of town a lot.  She has tried melatonin in the past, unclear of the dose but it did not seem to help.  She has not fallen, she exercises regularly and tries to hydrate well.  Husband has noticed short-term memory issues, she has a tendency to repeat herself.  She continues to take Linzess for chronic constipation.  She is willing to further reduce her Cogentin and come off of it altogether.  She has discomfort in the right knee and sometimes it pops.  Her balance is not as good.   The patient's allergies, current medications, family history, past medical history, past social history, past surgical  history and problem list were reviewed and updated as appropriate.   12/04/20 (Dr. Jannifer Franklin): <<[name] [name] is a 72 year old right-handed black female with a history of Parkinson's disease primarily with left-sided features. She has restless leg syndrome as well. She has had some dystonic posturing of the left foot and will have cramps involving the toes on that foot. The patient is having some low back pain with standing associated with postural changes. The patient goes to the [facility] and uses a treadmill and she can walk for 30 minutes or more without any back pain. The patient still has some difficulty sleeping at night. She denies any fatigue during the day. She tries to stay active. She does not have any wearing off effects of her medication. She does feel somewhat staggery at times, she reports no falls. She has difficulty walking in crowds. She returns for [name] evaluation. She indicates that the low-dose Requip as helped her restless leg syndrome significantly.>>          ROS:   14 system review of systems performed and negative with exception of ***  PMH:  Past Medical History:  Diagnosis Date   Bursitis    Cellulitis    arm   Common peroneal neuropathy, left 04/27/2019   Headache    Hyperlipidemia    Insomnia    OA (osteoarthritis)    Onychomycosis    Osteoarthritis    Parkinson disease (  HCC) 03/23/2015   RLS (restless legs syndrome) 03/23/2015   Tenosynovitis     PSH:  Past Surgical History:  Procedure Laterality Date   CAROTID ENDARTERECTOMY     TUBAL LIGATION      Social History:  Social History   Socioeconomic History   Marital status: Married    Spouse name: roger   Number of children: Not on file   Years of education: bachelor   Highest education level: Not on file  Occupational History   Occupation: [employer]    Comment: retired  Tobacco Use   Smoking status: Never   Smokeless tobacco: Never  Building services engineer Use: Never used   Substance and Sexual Activity   Alcohol use: No   Drug use: No   Sexual activity: Not on file    Comment: Married  Other Topics Concern   Not on file  Social History Narrative   Lives at home w/ her husband   Patient does not drink caffeine.    Patient is right handed.   Social Determinants of Health   Financial Resource Strain: Not on file  Food Insecurity: Not on file  Transportation Needs: Not on file  Physical Activity: Not on file  Stress: Not on file  Social Connections: Not on file  Intimate Partner Violence: Not on file    Family History:  Family History  Problem Relation Age of Onset   Heart failure Mother    Stroke Mother    Prostate cancer Father    Diabetes type II Sister    Asthma Sister    Tremor Sister    Diabetes type II Brother    Diabetes type II Brother    Cancer Brother        prostate   Cancer Brother        prostate   Cancer Brother        prostate   Allergic rhinitis Neg Hx    Angioedema Neg Hx    Atopy Neg Hx    Eczema Neg Hx    Immunodeficiency Neg Hx    Parkinson's disease Neg Hx     Medications:   Current Outpatient Medications on File Prior to Visit  Medication Sig Dispense Refill   acetaminophen (TYLENOL) 500 MG tablet Take 1,000 mg by mouth at bedtime.     albuterol (VENTOLIN HFA) 108 (90 Base) MCG/ACT inhaler Inhale 2 puffs into the lungs every 6 (six) hours as needed for wheezing or shortness of breath.     amLODipine (NORVASC) 2.5 MG tablet 2.5 mg daily.     atorvastatin (LIPITOR) 20 MG tablet Take 20 mg by mouth daily.     benztropine (COGENTIN) 0.5 MG tablet Take 1 tablet (0.5 mg total) by mouth 2 (two) times daily. 180 tablet 1   Calcium Carb-Cholecalciferol (CALCIUM 600 + D PO) Take 2 tablets by mouth daily.     carbidopa-levodopa (SINEMET CR) 50-200 MG tablet TAKE 1 TABLET BY MOUTH THREE TIMES A DAY 270 tablet 1   LINZESS 145 MCG CAPS capsule Take 145 mcg by mouth daily.     Omega-3 Fatty Acids (FISH OIL) 1200 MG CAPS  Take 2,400 mg by mouth daily.     Potassium 99 MG TABS Take 99 mg by mouth daily.     pyridOXINE (VITAMIN B-6) 100 MG tablet Take 100 mg by mouth daily.     rOPINIRole (REQUIP) 1 MG tablet TAKE 1 TABLET BY MOUTH THREE TIMES A DAY 270  tablet 1   vitamin B-12 (CYANOCOBALAMIN) 1000 MCG tablet Take 1,000 mcg by mouth daily.     Zinc Oxide-Vitamin C (ZINC PLUS VITAMIN C PO) Take 1 tablet by mouth daily.     No current facility-administered medications on file prior to visit.    Allergies:   Allergies  Allergen Reactions   Iodine Swelling   Iohexol Swelling     Desc: FACIAL SWELLING and temporary blindness    Penicillins Itching and Swelling   Shellfish Allergy Swelling   Caffeine Other (See Comments)    Palpitations and pain    Gabapentin Other (See Comments)    Insomnia    Sulfa Drugs Cross Reactors Nausea And Vomiting   Tramadol Nausea And Vomiting      OBJECTIVE:  Physical Exam  There were no vitals filed for this visit. There is no height or weight on file to calculate BMI. No results found.   General: well developed, well nourished, seated, in no evident distress Head: head normocephalic and atraumatic.   Neck: supple with no carotid or supraclavicular bruits Cardiovascular: regular rate and rhythm, no murmurs Musculoskeletal: no deformity Skin:  no rash/petichiae Vascular:  Normal pulses all extremities   Neurologic Exam Mental Status: Awake and fully alert. Oriented to place and time. Recent and remote memory intact. Attention span, concentration and fund of knowledge appropriate. Mood and affect appropriate.  Cranial Nerves: Pupils equal, briskly reactive to light. Extraocular movements full without nystagmus. Visual fields full to confrontation. Hearing intact. Facial sensation intact. Face, tongue, palate moves normally and symmetrically.  Motor: Normal bulk and tone. Normal strength in all tested extremity muscles Sensory.: intact to touch , pinprick , position  and vibratory sensation.  Coordination: Rapid alternating movements normal in all extremities. Finger-to-nose and heel-to-shin performed accurately bilaterally. Gait and Station: Arises from chair without difficulty. Stance is normal. Gait demonstrates normal stride length and balance without use of AD. Tandem walk and heel toe without difficulty.  Reflexes: 1+ and symmetric. Toes downgoing.         ASSESSMENT/PLAN: ELLEANA STILLSON is a 72 y.o. year old female with Parkinson's disease    -Continue Sinemet CR 50-200 1 tab TID -continue ropinirole 1mg  TID -now off Cogentin due to cognitive side effects       Follow up in *** or call earlier if needed   CC:  PCP: Caryl Bis, MD    I spent *** minutes of face-to-face and non-face-to-face time with patient.  This included previsit chart review, lab review, study review, order entry, electronic health record documentation, patient education regarding ***   Frann Rider, AGNP-BC  [facility] [address] [place], [place] [address]  Phone [phone] Fax [phone] Note: This document was prepared with digital dictation and possible smart phrase technology. Any transcriptional errors that result from this process are unintentional.

## 2022-03-12 ENCOUNTER — Ambulatory Visit (INDEPENDENT_AMBULATORY_CARE_PROVIDER_SITE_OTHER): Payer: Medicare Other | Admitting: Adult Health

## 2022-03-12 ENCOUNTER — Encounter: Payer: Self-pay | Admitting: Adult Health

## 2022-03-12 VITALS — BP 125/82 | HR 77 | Ht 66.0 in | Wt 162.0 lb

## 2022-03-12 DIAGNOSIS — G20A1 Parkinson's disease without dyskinesia, without mention of fluctuations: Secondary | ICD-10-CM

## 2022-03-12 DIAGNOSIS — G2581 Restless legs syndrome: Secondary | ICD-10-CM

## 2022-03-12 NOTE — Patient Instructions (Addendum)
Continue Sinemet CR 1 tab 3 times daily and Requip 1mg  3 times daily  Continue to participate in exercise program  Continue magnesium supplement for cramping and GI support    Follow up in 6 months with Dr. Rexene Alberts or call earlier if needed

## 2022-03-13 ENCOUNTER — Telehealth: Payer: Self-pay | Admitting: Adult Health

## 2022-03-13 NOTE — Telephone Encounter (Signed)
Pt called back. [name] to her to call back and let her know magnesium she is taking. Stated she is taking magnesium extra strength 400 mg.

## 2022-03-13 NOTE — Telephone Encounter (Signed)
Noted this is dose listed on chart, will inform Janett Billow

## 2022-03-18 DIAGNOSIS — M25561 Pain in right knee: Secondary | ICD-10-CM | POA: Diagnosis not present

## 2022-03-18 DIAGNOSIS — H81399 Other peripheral vertigo, unspecified ear: Secondary | ICD-10-CM | POA: Diagnosis not present

## 2022-03-25 DIAGNOSIS — H81399 Other peripheral vertigo, unspecified ear: Secondary | ICD-10-CM | POA: Diagnosis not present

## 2022-03-25 DIAGNOSIS — M25561 Pain in right knee: Secondary | ICD-10-CM | POA: Diagnosis not present

## 2022-04-01 DIAGNOSIS — H81399 Other peripheral vertigo, unspecified ear: Secondary | ICD-10-CM | POA: Diagnosis not present

## 2022-04-01 DIAGNOSIS — M25561 Pain in right knee: Secondary | ICD-10-CM | POA: Diagnosis not present

## 2022-04-02 DIAGNOSIS — G20A1 Parkinson's disease without dyskinesia, without mention of fluctuations: Secondary | ICD-10-CM | POA: Diagnosis not present

## 2022-04-06 DIAGNOSIS — M25512 Pain in left shoulder: Secondary | ICD-10-CM | POA: Diagnosis not present

## 2022-04-12 DIAGNOSIS — M546 Pain in thoracic spine: Secondary | ICD-10-CM | POA: Diagnosis not present

## 2022-04-12 DIAGNOSIS — M1990 Unspecified osteoarthritis, unspecified site: Secondary | ICD-10-CM | POA: Diagnosis not present

## 2022-04-22 DIAGNOSIS — E039 Hypothyroidism, unspecified: Secondary | ICD-10-CM | POA: Diagnosis not present

## 2022-04-22 DIAGNOSIS — E7849 Other hyperlipidemia: Secondary | ICD-10-CM | POA: Diagnosis not present

## 2022-04-22 DIAGNOSIS — Z0000 Encounter for general adult medical examination without abnormal findings: Secondary | ICD-10-CM | POA: Diagnosis not present

## 2022-04-25 DIAGNOSIS — K5900 Constipation, unspecified: Secondary | ICD-10-CM | POA: Diagnosis not present

## 2022-04-25 DIAGNOSIS — I10 Essential (primary) hypertension: Secondary | ICD-10-CM | POA: Diagnosis not present

## 2022-04-25 DIAGNOSIS — M25519 Pain in unspecified shoulder: Secondary | ICD-10-CM | POA: Diagnosis not present

## 2022-04-25 DIAGNOSIS — Z0001 Encounter for general adult medical examination with abnormal findings: Secondary | ICD-10-CM | POA: Diagnosis not present

## 2022-04-25 DIAGNOSIS — M542 Cervicalgia: Secondary | ICD-10-CM | POA: Diagnosis not present

## 2022-04-25 DIAGNOSIS — E7849 Other hyperlipidemia: Secondary | ICD-10-CM | POA: Diagnosis not present

## 2022-04-25 DIAGNOSIS — Z23 Encounter for immunization: Secondary | ICD-10-CM | POA: Diagnosis not present

## 2022-04-25 DIAGNOSIS — G20A1 Parkinson's disease without dyskinesia, without mention of fluctuations: Secondary | ICD-10-CM | POA: Diagnosis not present

## 2022-04-25 DIAGNOSIS — M1990 Unspecified osteoarthritis, unspecified site: Secondary | ICD-10-CM | POA: Diagnosis not present

## 2022-04-26 ENCOUNTER — Other Ambulatory Visit: Payer: Self-pay | Admitting: Neurology

## 2022-04-26 DIAGNOSIS — M25512 Pain in left shoulder: Secondary | ICD-10-CM | POA: Diagnosis not present

## 2022-05-01 DIAGNOSIS — M4802 Spinal stenosis, cervical region: Secondary | ICD-10-CM | POA: Diagnosis not present

## 2022-05-01 DIAGNOSIS — M47812 Spondylosis without myelopathy or radiculopathy, cervical region: Secondary | ICD-10-CM | POA: Diagnosis not present

## 2022-05-01 DIAGNOSIS — R531 Weakness: Secondary | ICD-10-CM | POA: Diagnosis not present

## 2022-05-01 DIAGNOSIS — M5021 Other cervical disc displacement,  high cervical region: Secondary | ICD-10-CM | POA: Diagnosis not present

## 2022-05-01 DIAGNOSIS — M25519 Pain in unspecified shoulder: Secondary | ICD-10-CM | POA: Diagnosis not present

## 2022-05-03 DIAGNOSIS — M25512 Pain in left shoulder: Secondary | ICD-10-CM | POA: Diagnosis not present

## 2022-05-14 DIAGNOSIS — M25512 Pain in left shoulder: Secondary | ICD-10-CM | POA: Diagnosis not present

## 2022-05-14 DIAGNOSIS — M4722 Other spondylosis with radiculopathy, cervical region: Secondary | ICD-10-CM | POA: Diagnosis not present

## 2022-05-27 DIAGNOSIS — M4722 Other spondylosis with radiculopathy, cervical region: Secondary | ICD-10-CM | POA: Diagnosis not present

## 2022-05-31 DIAGNOSIS — M4722 Other spondylosis with radiculopathy, cervical region: Secondary | ICD-10-CM | POA: Diagnosis not present

## 2022-06-03 DIAGNOSIS — M4722 Other spondylosis with radiculopathy, cervical region: Secondary | ICD-10-CM | POA: Diagnosis not present

## 2022-06-05 DIAGNOSIS — M4722 Other spondylosis with radiculopathy, cervical region: Secondary | ICD-10-CM | POA: Diagnosis not present

## 2022-06-11 DIAGNOSIS — M4722 Other spondylosis with radiculopathy, cervical region: Secondary | ICD-10-CM | POA: Diagnosis not present

## 2022-06-12 DIAGNOSIS — M4722 Other spondylosis with radiculopathy, cervical region: Secondary | ICD-10-CM | POA: Diagnosis not present

## 2022-06-13 DIAGNOSIS — M4722 Other spondylosis with radiculopathy, cervical region: Secondary | ICD-10-CM | POA: Diagnosis not present

## 2022-06-13 DIAGNOSIS — M25512 Pain in left shoulder: Secondary | ICD-10-CM | POA: Diagnosis not present

## 2022-06-17 ENCOUNTER — Other Ambulatory Visit: Payer: Self-pay | Admitting: Neurology

## 2022-06-27 DIAGNOSIS — Z1231 Encounter for screening mammogram for malignant neoplasm of breast: Secondary | ICD-10-CM | POA: Diagnosis not present

## 2022-07-16 DIAGNOSIS — M25511 Pain in right shoulder: Secondary | ICD-10-CM | POA: Diagnosis not present

## 2022-08-06 DIAGNOSIS — M25511 Pain in right shoulder: Secondary | ICD-10-CM | POA: Diagnosis not present

## 2022-08-07 IMAGING — MR MR CERVICAL SPINE W/O CM
5 series · 29 of 48 positions shown · non-contrast
Comparison: None.

CLINICAL DATA: Neck pain radiating to left shoulder.

EXAM:
MRI CERVICAL SPINE WITHOUT CONTRAST
TECHNIQUE: Multiplanar, multisequence MR imaging of the cervical spine was
performed. No intravenous contrast was administered.

[Series 5: t2_tse_sag_fast · sagittal · 3.0mm · 0.43mm/px · 7 of 15 slices shown]
[im 1/15]
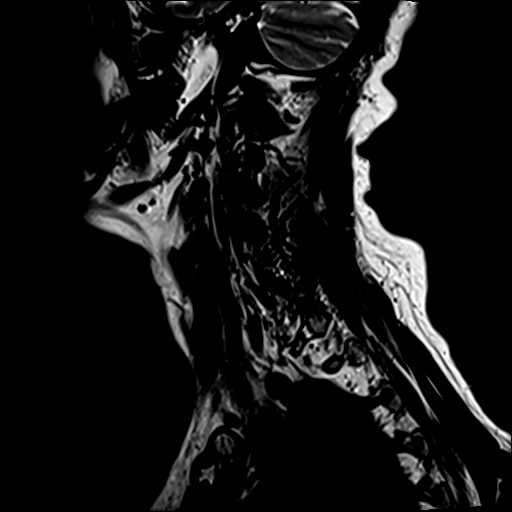
[im 3/15]
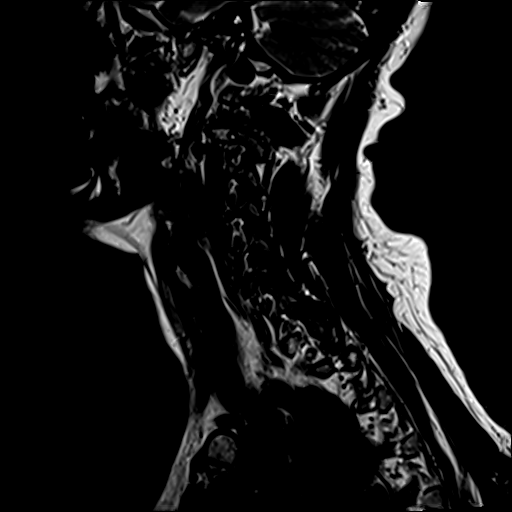
[im 5/15]
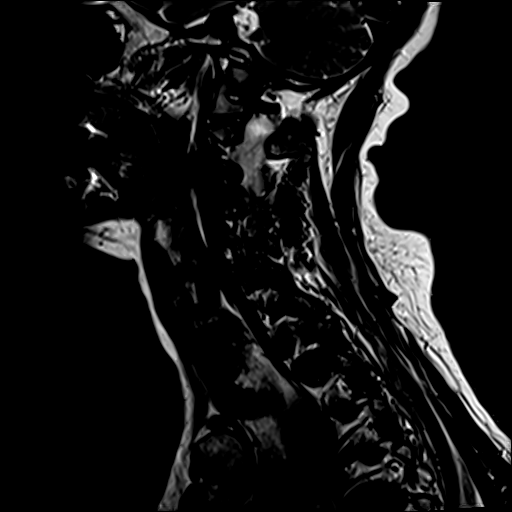
[im 8/15]
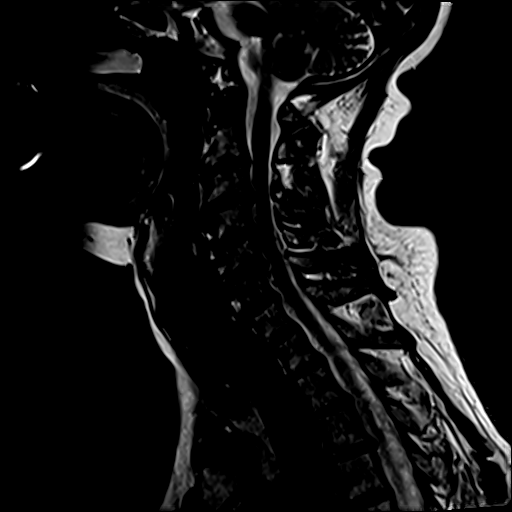
[im 10/15]
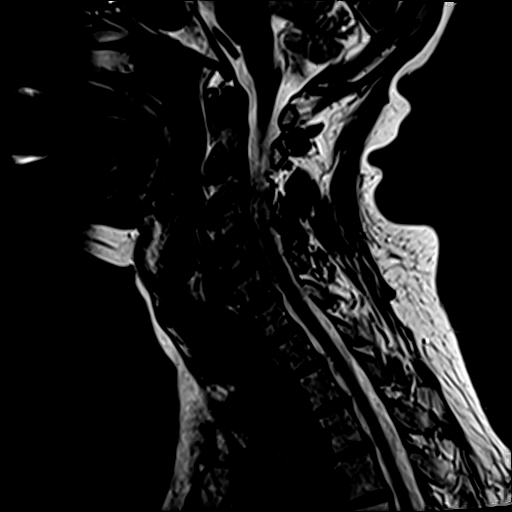
[im 12/15]
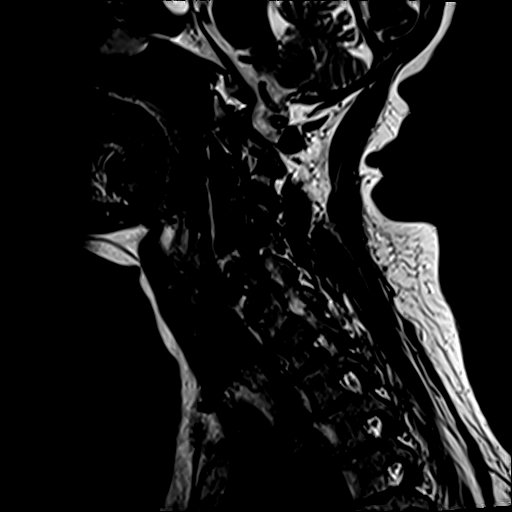
[im 15/15]
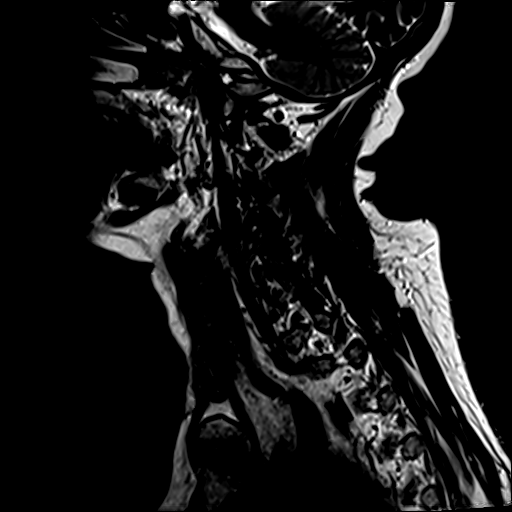

[Series 6: t1_tse_sag_fast · sagittal · 3.0mm · 0.43mm/px · 7 of 15 slices shown]
[im 1/15]
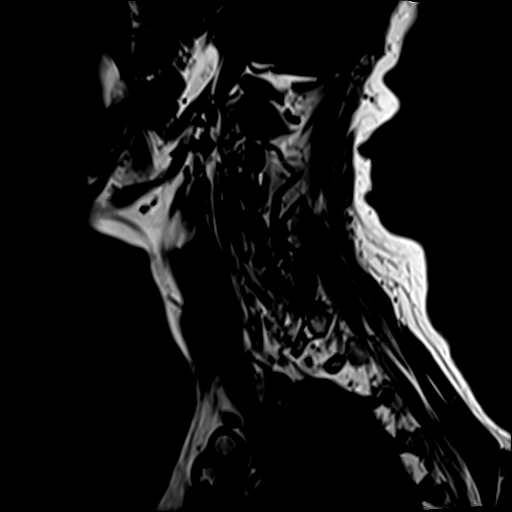
[im 3/15]
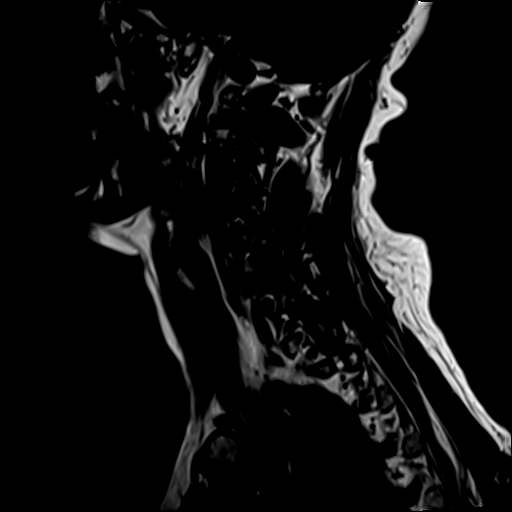
[im 5/15]
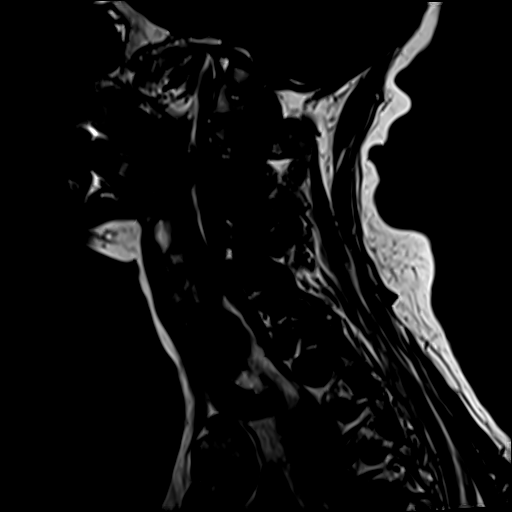
[im 8/15]
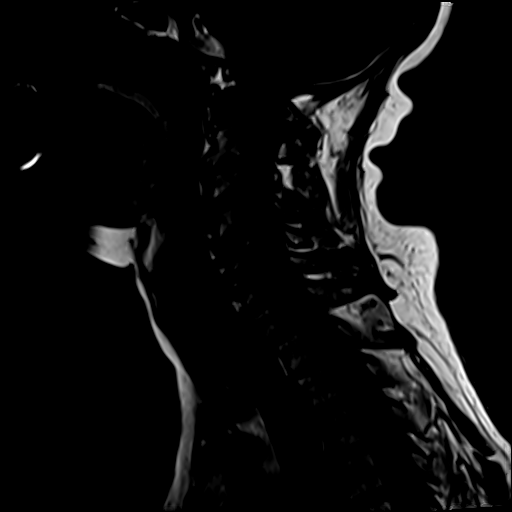
[im 10/15]
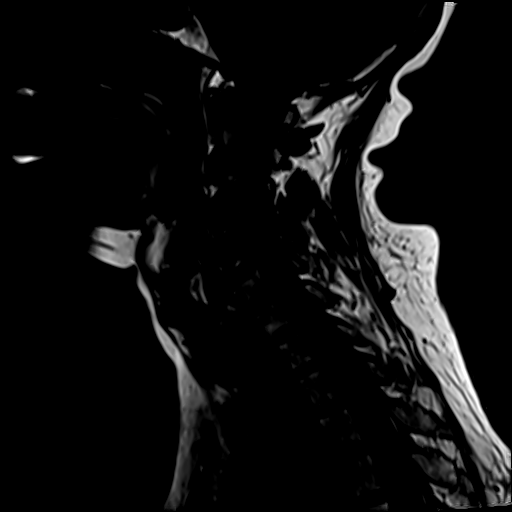
[im 12/15]
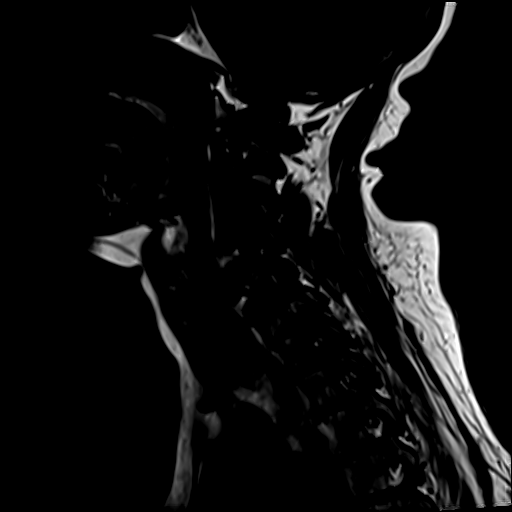
[im 15/15]
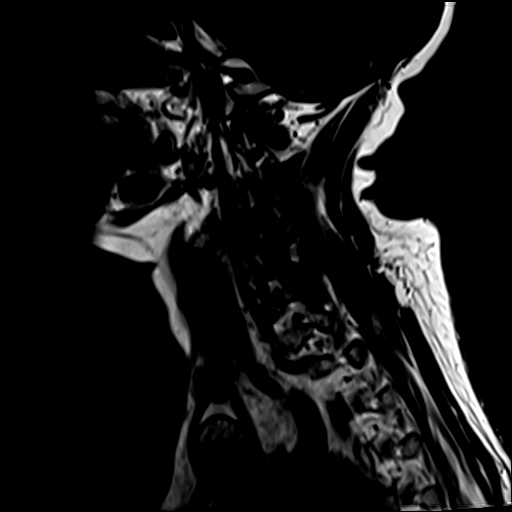

[Series 7: STIR · sagittal · 3.0mm · 0.86mm/px · 6 of 15 slices shown]
[im 1/15]
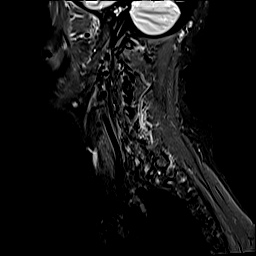
[im 3/15]
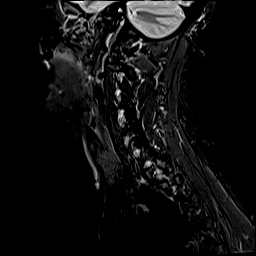
[im 6/15]
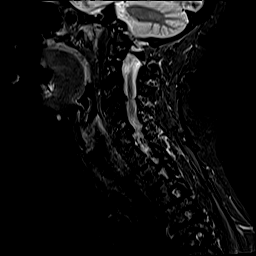
[im 9/15]
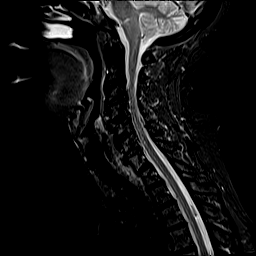
[im 12/15]
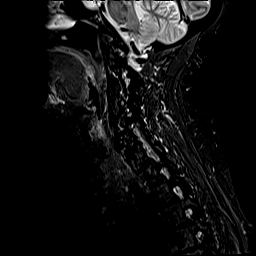
[im 15/15]
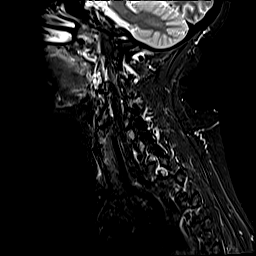

[Series 8: t2_tse_tra_fast · axial · 3.0mm · 0.78mm/px · z∈[-45,+62]mm · 8 of 34 slices shown]
[im 1/34]
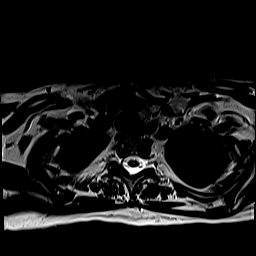
[im 6/34]
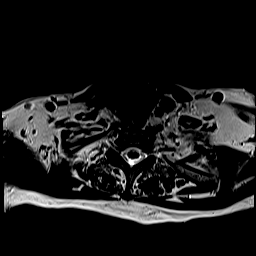
[im 11/34]
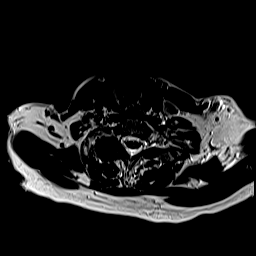
[im 16/34]
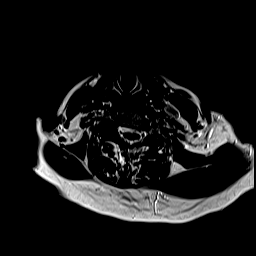
[im 18/34]
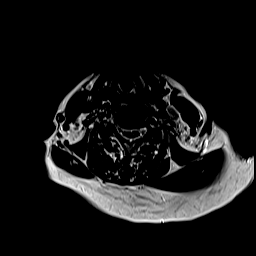
[im 23/34]
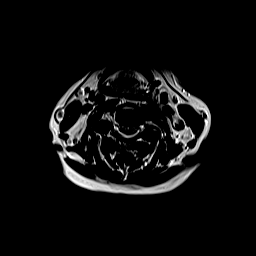
[im 28/34]
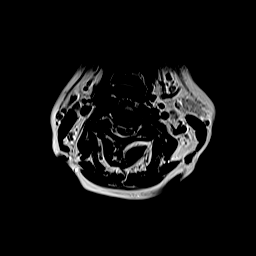
[im 34/34]
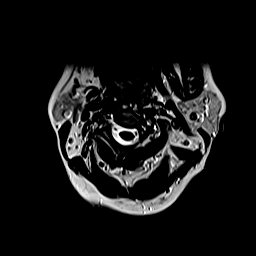

[Series 9: GRE · axial · 3.0mm · 0.78mm/px · 1 of 34 slices shown]
[im 1/34]
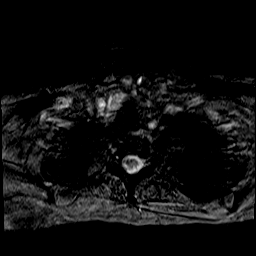

[29 of 48 positions shown; findings below may reference images not displayed]

FINDINGS: Alignment: Preservation of lordosis. Minimal grade 1 C7-T1
anterolisthesis. Minimal grade 1 C3-4 and C5-6 retrolisthesis.

Vertebrae: Multilevel Modic type 2 endplate degenerative changes. No
focal osseous lesion. Vertebral body heights are preserved.

Cord: Mild diffuse T2 hyperintense cord signal spanning the C3-6
levels ([DATE]) without significant volume loss may reflect early
myelomalacia.

Posterior Fossa, vertebral arteries: Negative.

Disc levels: Multilevel endplate osteophytosis, desiccation and disc
space loss.

C2-3: Disc osteophyte complex with central protrusion partially
effacing the ventral CSF containing spaces. Uncovertebral and facet
degenerative spurring. Patent spinal canal and neural foramen.

C3-4: Disc osteophyte complex with central protrusion abutting the
ventral cord. Uncovertebral and facet hypertrophy. Mild spinal canal
and bilateral neural foraminal narrowing.

C4-5: Disc osteophyte complex with uncovertebral and facet
hypertrophy. Mild spinal canal and bilateral neural foraminal
narrowing.

C5-6: Disc osteophyte complex with superimposed shallow right
paracentral protrusion. Uncovertebral and facet hypertrophy. Mild
spinal canal and bilateral neural foraminal narrowing.

C6-7: Disc osteophyte complex with left foraminal protrusion,
uncovertebral and facet hypertrophy. Patent spinal canal and right
neural foramen. Mild left neural foraminal narrowing.

C7-T1: Uncovertebral and facet hypertrophy. Mild right neural
foraminal narrowing. Patent spinal canal and left neural foramen.

Paraspinal tissues: Negative.
IMPRESSION: Mild spinal canal and bilateral neural foraminal narrowing at the
C3-C6 levels.

Left C6-7 foraminal protrusion with mild neural foraminal narrowing.

Mild right C7-T1 neural foraminal narrowing.

Early myelomalacia at the C3-6 levels.

## 2022-08-13 DIAGNOSIS — M25511 Pain in right shoulder: Secondary | ICD-10-CM | POA: Diagnosis not present

## 2022-08-20 DIAGNOSIS — M25511 Pain in right shoulder: Secondary | ICD-10-CM | POA: Diagnosis not present

## 2022-08-26 DIAGNOSIS — I10 Essential (primary) hypertension: Secondary | ICD-10-CM | POA: Diagnosis not present

## 2022-08-26 DIAGNOSIS — E7849 Other hyperlipidemia: Secondary | ICD-10-CM | POA: Diagnosis not present

## 2022-08-26 DIAGNOSIS — E559 Vitamin D deficiency, unspecified: Secondary | ICD-10-CM | POA: Diagnosis not present

## 2022-08-28 DIAGNOSIS — M25511 Pain in right shoulder: Secondary | ICD-10-CM | POA: Diagnosis not present

## 2022-09-02 DIAGNOSIS — I10 Essential (primary) hypertension: Secondary | ICD-10-CM | POA: Diagnosis not present

## 2022-09-02 DIAGNOSIS — E7849 Other hyperlipidemia: Secondary | ICD-10-CM | POA: Diagnosis not present

## 2022-09-02 DIAGNOSIS — M25519 Pain in unspecified shoulder: Secondary | ICD-10-CM | POA: Diagnosis not present

## 2022-09-02 DIAGNOSIS — M1990 Unspecified osteoarthritis, unspecified site: Secondary | ICD-10-CM | POA: Diagnosis not present

## 2022-09-02 DIAGNOSIS — K5900 Constipation, unspecified: Secondary | ICD-10-CM | POA: Diagnosis not present

## 2022-09-02 DIAGNOSIS — G20A1 Parkinson's disease without dyskinesia, without mention of fluctuations: Secondary | ICD-10-CM | POA: Diagnosis not present

## 2022-09-03 ENCOUNTER — Encounter: Payer: Self-pay | Admitting: Neurology

## 2022-09-04 DIAGNOSIS — M25511 Pain in right shoulder: Secondary | ICD-10-CM | POA: Diagnosis not present

## 2022-09-09 DIAGNOSIS — M25511 Pain in right shoulder: Secondary | ICD-10-CM | POA: Diagnosis not present

## 2022-09-10 ENCOUNTER — Ambulatory Visit (INDEPENDENT_AMBULATORY_CARE_PROVIDER_SITE_OTHER): Payer: Medicare Other | Admitting: Neurology

## 2022-09-10 ENCOUNTER — Ambulatory Visit: Payer: BC Managed Care – PPO | Admitting: Adult Health

## 2022-09-10 ENCOUNTER — Encounter: Payer: Self-pay | Admitting: Neurology

## 2022-09-10 VITALS — BP 131/83 | HR 80 | Ht 66.0 in | Wt 165.0 lb

## 2022-09-10 DIAGNOSIS — G20A1 Parkinson's disease without dyskinesia, without mention of fluctuations: Secondary | ICD-10-CM

## 2022-09-10 DIAGNOSIS — K5909 Other constipation: Secondary | ICD-10-CM | POA: Diagnosis not present

## 2022-09-10 DIAGNOSIS — G4752 REM sleep behavior disorder: Secondary | ICD-10-CM | POA: Diagnosis not present

## 2022-09-10 MED ORDER — ROPINIROLE HCL 2 MG PO TABS: 2.0000 mg | ORAL_TABLET | Freq: Three times a day (TID) | ORAL | 3 refills | Status: DC

## 2022-09-10 MED ORDER — CARBIDOPA-LEVODOPA ER 50-200 MG PO TBCR: 1.0000 | EXTENDED_RELEASE_TABLET | Freq: Three times a day (TID) | ORAL | 3 refills | Status: DC
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
## 2022-09-10 NOTE — Progress Notes (Signed)
Order for DME Single point cane sent to [facility]. Confirmation received.

## 2022-09-10 NOTE — Patient Instructions (Signed)
We will increase your ropinirole to 2 mg three times a day.  We will continue with the levodopa, but may increase it next time.  I will prescribe a cane, order will be sent to [facility]. Please try to exercise on a daily basis.  Follow up to see [name] [name] in 6 months.

## 2022-09-12 ENCOUNTER — Other Ambulatory Visit: Payer: Self-pay | Admitting: Neurology

## 2022-09-16 ENCOUNTER — Telehealth: Payer: Self-pay | Admitting: Neurology

## 2022-09-16 DIAGNOSIS — M25511 Pain in right shoulder: Secondary | ICD-10-CM | POA: Diagnosis not present

## 2022-09-16 NOTE — Telephone Encounter (Signed)
Pt said discussed at last office visit a referral PT for walking. Need a referral for walking [facility], [place].

## 2022-09-16 NOTE — Telephone Encounter (Signed)
Contacted pt back, informed her of MD recommendations. She agreed, verbally understood and was appreciative.

## 2022-09-16 NOTE — Telephone Encounter (Signed)
Contacted pt back, per last OV "She is in physical therapy once a week in [place]."  She is currently doing it for her arm. She needs PT for walking. I do not see anything noted regarding PT. Are you willing the place order for walking?

## 2022-09-16 NOTE — Telephone Encounter (Signed)
I think for now we will wait and see how she does with the increased dose of ropinirole.  I will hold off on the physical therapy referral.  She also has an appointment with Dr. Arbutus Leas coming up in less than a month, she may have additional recommendations after the visit.

## 2022-09-23 DIAGNOSIS — M25511 Pain in right shoulder: Secondary | ICD-10-CM | POA: Diagnosis not present

## 2022-09-23 NOTE — Progress Notes (Unsigned)
Assessment/Plan:   1.  Parkinsons Disease, dx 2017 at [facility]  -would recommend changing from carbidopa/levodopa 50/200 cr, 1 po tid to carbidopa/levodopa 25/100, 2 po tid.  While overall dose is the same, bioavailability is improved is increased with this formulation.    -Would recommend changing timing of medication.  She is currently taking medication at 8 AM/4 PM/10 PM.  Would recommend she take carbidopa/levodopa 25/100, 2 tablets at 8 AM/noon/4 PM.  -She has not yet increased to the ropinirole, and I would recommend that we do not do it yet, since I am making the above change.  I did recommend that we change the timing of the ropinirole so she takes ropinirole, 1 mg, 1 tablet at 8 AM/noon/4 PM.  -She asked me about going back on the Cogentin.  While I agree Cogentin is a good tremor medication, her balance is really not good and I think that the Cogentin would make it worse.  Once I discussed this with her, she understood it and agreed that we should not go back on it.  -We discussed that it used to be thought that levodopa would increase risk of melanoma but now it is believed that Parkinsons itself likely increases risk of melanoma. she is to get regular skin checks.  -PT - [facility] in [place]  -Discussed community resources and a packet of information was discussed and given to her.  -Depending on how she responds to the above, we may consider repeating the MRI of the brain.  It has not been done in years.  Her neuroexam though was nonfocal and nonlateralizing today.  2.  Constipation  -On Linzess  3.  REM behavior disorder  -This is commonly associated with PD and the patient is experiencing this.  We discussed that this can be very serious and even harmful.  We talked about medications as well as physical barriers to put in the bed (particularly soft bed rails, pillow barriers).  We talked about moving the night stand so that it is not so close to the side of the bed.   Subjective:    [name] [name] was seen today in the movement disorders clinic for neurologic consultation at the request of Richardean Chimera, MD.  The consultation is for the evaluation of Parkinsons disease.  Patient has previously seen Dr. Anne Hahn, Dr. Gerilyn Pilgrim and currently Dr. Frances Furbish.  Notes are reviewed.  Notes from Dr. Anne Hahn from 2016 indicate that her first symptom was left hand (pt reports it was L index finger) and left leg tremor in March, 2016.  While it was a resting tremor, he felt she did not have any other symptoms of Parkinson's disease and they took a wait-and-see approach early on.  When she was seen back at her follow-up visit in June, 2017, she was diagnosed with Parkinsons disease.  She was lost to follow-up for several years.  She was started on levodopa 2021.  She was diagnosed with a peroneal neuropathy in 2021 as well, manifesting as left foot drop.  Dr. Anne Hahn started her on Cogentin for tremor in early 2022 and discontinued in September, 2023.  This did cause some cognitive dulling.  Ropinirole was added in June, 2022.  Patient was last seen by Dr. Frances Furbish on September 10, 2022.  At that time, her ropinirole was increased from 1 mg 3 times per day to 2 mg 3 times per day.  Pt has not done this.  Current movement disorder medications: Carbidopa/levodopa 50/200  CR, 1 tablet 3 times per day (8am/4pm/10pm) Ropinirole, 1 mg 3 times per day   She states that tremor increased with cogentin being d/c.     Specific Symptoms:  Tremor: Yes.  , started L arm, then L leg and now R arm as well.   Family hx of similar:  No. Voice: sister doesn't think it changed but pt thinks its a bit weaker Sleep: trouble going to sleep and staying asleep  Vivid Dreams:  Yes.    Acting out dreams:  Yes.   Larey Seat out of the bed in May b/c of it) [employer] Pillows: No. Postural symptoms:  Yes.    Falls?  No. Bradykinesia symptoms: slow movements and difficulty getting out of a chair Loss of smell:  yes Loss of taste:   yes Urinary Incontinence:  No. Difficulty Swallowing:  No. Handwriting, micrographia: Yes.   Trouble with ADL's:  no  Trouble buttoning clothing: Yes.   Depression:  No. But does have anxiety with driving Memory changes:  No. Hallucinations:  No.  visual distortions: No. N/V:  No. Lightheaded:  No.  Syncope: No. Diplopia:  No. Dyskinesia:  No.   PREVIOUS MEDICATIONS:  Cogentin; levodopa; ropinirole  ALLERGIES:   Allergies  Allergen Reactions   Iodine Swelling   Iohexol Swelling     Desc: FACIAL SWELLING and temporary blindness    Penicillins Itching and Swelling   Shellfish Allergy Swelling   Caffeine Other (See Comments)    Palpitations and pain    Gabapentin Other (See Comments)    Insomnia    Sulfa Drugs Cross Reactors Nausea And Vomiting   Tramadol Nausea And Vomiting    CURRENT MEDICATIONS:  Current Meds  Medication Sig   albuterol (VENTOLIN HFA) 108 (90 Base) MCG/ACT inhaler Inhale 2 puffs into the lungs every 6 (six) hours as needed for wheezing or shortness of breath.   Ascorbic Acid (VITAMIN C PO) Take by mouth.   atorvastatin (LIPITOR) 20 MG tablet Take 20 mg by mouth daily.   Cholecalciferol (VITAMIN D) 50 MCG (2000 UT) CAPS Take 1 each by mouth daily.   LINZESS 145 MCG CAPS capsule Take 145 mcg by mouth daily.   Naproxen Sodium (ALEVE PO) Take by mouth as needed.   rOPINIRole (REQUIP) 2 MG tablet Take 1 tablet (2 mg total) by mouth 3 (three) times daily.   vitamin B-12 (CYANOCOBALAMIN) 1000 MCG tablet Take 1,000 mcg by mouth daily.     Objective:   VITALS:   Vitals:   09/25/22 0959  BP: 118/78  Pulse: 88  SpO2: 96%  Weight: 161 lb (73 kg)  Height: 5\' 6"  (1.676 m)    GEN:  The patient appears stated age and is in NAD. HEENT:  Normocephalic, atraumatic.  The mucous membranes are moist. The superficial temporal arteries are without ropiness or tenderness. CV:  RRR Lungs:  CTAB Neck/HEME:  There are no carotid bruits  bilaterally.  Neurological examination:  Orientation: The patient is alert and oriented x3.  Cranial nerves: There is good facial symmetry. Extraocular muscles are intact. The visual fields are full to confrontational testing. The speech is fluent and clear. Soft palate rises symmetrically and there is no tongue deviation. Hearing is intact to conversational tone. Sensation: Sensation is intact to light and pinprick throughout (facial, trunk, extremities). Vibration is intact at the bilateral big toe. There is no extinction with double simultaneous stimulation. There is no sensory dermatomal level identified. Motor: Strength is 5/5 in the bilateral upper and  lower extremities.   shoulder shrug is equal and symmetric.  There is no pronator drift. Deep tendon reflexes: Deep tendon reflexes are 2-/4 at the bilateral biceps, triceps, brachioradialis, patella and achilles. Plantar responses are downgoing bilaterally.  Movement examination: Tone: There is mild increased tone in the LUE Abnormal movements: there is L>RUE rest tremor that increases with distraction Coordination:  There is  decremation with RAM's, with any form of RAMS, including alternating supination and pronation of the forearm, hand opening and closing, finger taps, heel taps and toe taps, on the L, esp in the LE (unable to do hand opening and closing on the L due to trigger finger) Gait and Station: The patient easily arises without the use of her hands.  She does not shuffle, but she is wide-based and ataxic.  She is unsteady. I have reviewed and interpreted the following labs independently   Chemistry      Component Value Date/Time   NA 133 (L) 03/03/2019 0527   K 4.1 03/03/2019 0527   CL 97 (L) 03/03/2019 0527   CO2 24 03/03/2019 0527   BUN 15 03/03/2019 0527   CREATININE 0.62 03/03/2019 0527   CREATININE 0.72 04/22/2016 1616      Component Value Date/Time   CALCIUM 9.4 03/03/2019 0527   ALKPHOS 57 03/03/2019 0527   AST  27 03/03/2019 0527   ALT 59 (H) 03/03/2019 0527   BILITOT 0.9 03/03/2019 0527      Lab Results  Component Value Date   TSH 1.83 04/22/2016   Lab Results  Component Value Date   WBC 9.0 03/03/2019   HGB 12.4 03/03/2019   HCT 38.0 03/03/2019   MCV 93.1 03/03/2019   PLT 288 03/03/2019     Total time spent on today's visit was 60 minutes, including both face-to-face time and nonface-to-face time.  Time included that spent on review of records (prior notes available to me/labs/imaging if pertinent), discussing treatment and goals, answering patient's questions and coordinating care.  Cc:  Richardean Chimera, MD

## 2022-09-25 ENCOUNTER — Other Ambulatory Visit: Payer: Self-pay

## 2022-09-25 ENCOUNTER — Ambulatory Visit: Payer: Medicare Other | Admitting: Neurology

## 2022-09-25 ENCOUNTER — Encounter: Payer: Self-pay | Admitting: Neurology

## 2022-09-25 VITALS — BP 118/78 | HR 88 | Ht 66.0 in | Wt 161.0 lb

## 2022-09-25 DIAGNOSIS — G4752 REM sleep behavior disorder: Secondary | ICD-10-CM | POA: Diagnosis not present

## 2022-09-25 DIAGNOSIS — G20A1 Parkinson's disease without dyskinesia, without mention of fluctuations: Secondary | ICD-10-CM

## 2022-09-25 DIAGNOSIS — G2581 Restless legs syndrome: Secondary | ICD-10-CM

## 2022-09-25 DIAGNOSIS — K5901 Slow transit constipation: Secondary | ICD-10-CM | POA: Diagnosis not present

## 2022-09-25 MED ORDER — CARBIDOPA-LEVODOPA 25-100 MG PO TABS: 2.0000 | ORAL_TABLET | Freq: Three times a day (TID) | ORAL | 1 refills | Status: DC

## 2022-09-25 NOTE — Patient Instructions (Addendum)
Take carbidopa/levodopa 25/100, 2 tablets at 8am/noon/4pm  As a reminder, carbidopa/levodopa can be taken at the same time as a carbohydrate, but we like to have you take your pill either 30 minutes before a protein source or 1 hour after as protein can interfere with carbidopa/levodopa absorption.   Take ropinirole 1 mg three times per day at 8am/noon/4pm.  Look up [facility] and let us know if you want [place] to send your RX there  I will send an order for Physical Therapy  SAVE THE DATE!  We are planning a Parkinsons Disease educational symposium at [facility] in [place] on October 11.  More details to come!  If you would like to be added to our email list to get further information, email [email or web address].  We hope to see you there!

## 2022-09-26 ENCOUNTER — Other Ambulatory Visit: Payer: Self-pay

## 2022-09-26 DIAGNOSIS — G20A1 Parkinson's disease without dyskinesia, without mention of fluctuations: Secondary | ICD-10-CM

## 2022-09-26 DIAGNOSIS — G2581 Restless legs syndrome: Secondary | ICD-10-CM

## 2022-09-26 MED ORDER — ROPINIROLE HCL 0.5 MG PO TABS
ORAL_TABLET | ORAL | 0 refills | Status: DC
Start: 2022-09-26 — End: 2023-01-01

## 2022-09-26 MED ORDER — CARBIDOPA-LEVODOPA 25-100 MG PO TABS
2.0000 | ORAL_TABLET | Freq: Three times a day (TID) | ORAL | 0 refills | Status: DC
Start: 2022-09-26 — End: 2023-01-01

## 2022-09-27 ENCOUNTER — Telehealth: Payer: Self-pay | Admitting: Neurology

## 2022-09-27 NOTE — Telephone Encounter (Signed)
Pt is calling in stating that the carbidopa-levodopa  (SINEMET IR) 25-100MG  pt stated that the medication is working but it is making her very sleepy and she would like to know if Dr. Arbutus Leas would like to make any changes to it.  Pt would like to have a call back.

## 2022-09-27 NOTE — Telephone Encounter (Signed)
Spoke to Dr. Arbutus Leas and she feels it maybe a bit strong for patient and can reduce to 1.5 carbidopa levodopa three times a day

## 2022-10-01 DIAGNOSIS — M25511 Pain in right shoulder: Secondary | ICD-10-CM | POA: Diagnosis not present

## 2022-10-07 DIAGNOSIS — G20A1 Parkinson's disease without dyskinesia, without mention of fluctuations: Secondary | ICD-10-CM | POA: Diagnosis not present

## 2022-10-07 DIAGNOSIS — R2681 Unsteadiness on feet: Secondary | ICD-10-CM | POA: Diagnosis not present

## 2022-10-07 DIAGNOSIS — G2581 Restless legs syndrome: Secondary | ICD-10-CM | POA: Diagnosis not present

## 2022-10-07 DIAGNOSIS — M6281 Muscle weakness (generalized): Secondary | ICD-10-CM | POA: Diagnosis not present

## 2022-10-08 ENCOUNTER — Ambulatory Visit: Payer: BC Managed Care – PPO | Admitting: Neurology

## 2022-10-11 DIAGNOSIS — M6281 Muscle weakness (generalized): Secondary | ICD-10-CM | POA: Diagnosis not present

## 2022-10-11 DIAGNOSIS — R2681 Unsteadiness on feet: Secondary | ICD-10-CM | POA: Diagnosis not present

## 2022-10-11 DIAGNOSIS — G2581 Restless legs syndrome: Secondary | ICD-10-CM | POA: Diagnosis not present

## 2022-10-11 DIAGNOSIS — G20A1 Parkinson's disease without dyskinesia, without mention of fluctuations: Secondary | ICD-10-CM | POA: Diagnosis not present

## 2022-10-15 DIAGNOSIS — G2581 Restless legs syndrome: Secondary | ICD-10-CM | POA: Diagnosis not present

## 2022-10-15 DIAGNOSIS — M6281 Muscle weakness (generalized): Secondary | ICD-10-CM | POA: Diagnosis not present

## 2022-10-15 DIAGNOSIS — G20A1 Parkinson's disease without dyskinesia, without mention of fluctuations: Secondary | ICD-10-CM | POA: Diagnosis not present

## 2022-10-15 DIAGNOSIS — R2681 Unsteadiness on feet: Secondary | ICD-10-CM | POA: Diagnosis not present

## 2022-10-17 DIAGNOSIS — G2581 Restless legs syndrome: Secondary | ICD-10-CM | POA: Diagnosis not present

## 2022-10-17 DIAGNOSIS — M6281 Muscle weakness (generalized): Secondary | ICD-10-CM | POA: Diagnosis not present

## 2022-10-17 DIAGNOSIS — R2681 Unsteadiness on feet: Secondary | ICD-10-CM | POA: Diagnosis not present

## 2022-10-17 DIAGNOSIS — G20A1 Parkinson's disease without dyskinesia, without mention of fluctuations: Secondary | ICD-10-CM | POA: Diagnosis not present

## 2022-10-29 DIAGNOSIS — R2681 Unsteadiness on feet: Secondary | ICD-10-CM | POA: Diagnosis not present

## 2022-10-29 DIAGNOSIS — G2581 Restless legs syndrome: Secondary | ICD-10-CM | POA: Diagnosis not present

## 2022-10-29 DIAGNOSIS — G20A1 Parkinson's disease without dyskinesia, without mention of fluctuations: Secondary | ICD-10-CM | POA: Diagnosis not present

## 2022-10-29 DIAGNOSIS — M6281 Muscle weakness (generalized): Secondary | ICD-10-CM | POA: Diagnosis not present

## 2022-10-31 DIAGNOSIS — R2681 Unsteadiness on feet: Secondary | ICD-10-CM | POA: Diagnosis not present

## 2022-10-31 DIAGNOSIS — G2581 Restless legs syndrome: Secondary | ICD-10-CM | POA: Diagnosis not present

## 2022-10-31 DIAGNOSIS — M6281 Muscle weakness (generalized): Secondary | ICD-10-CM | POA: Diagnosis not present

## 2022-10-31 DIAGNOSIS — G20A1 Parkinson's disease without dyskinesia, without mention of fluctuations: Secondary | ICD-10-CM | POA: Diagnosis not present

## 2022-11-05 ENCOUNTER — Telehealth: Payer: Self-pay | Admitting: Neurology

## 2022-11-05 DIAGNOSIS — M6281 Muscle weakness (generalized): Secondary | ICD-10-CM | POA: Diagnosis not present

## 2022-11-05 DIAGNOSIS — G2581 Restless legs syndrome: Secondary | ICD-10-CM | POA: Diagnosis not present

## 2022-11-05 DIAGNOSIS — R2681 Unsteadiness on feet: Secondary | ICD-10-CM | POA: Diagnosis not present

## 2022-11-05 DIAGNOSIS — G20A1 Parkinson's disease without dyskinesia, without mention of fluctuations: Secondary | ICD-10-CM | POA: Diagnosis not present

## 2022-11-05 NOTE — Telephone Encounter (Signed)
Patient left a message with the AN, this is concerning the prescription company that our office told her about. She wants a call back from [facility]

## 2022-11-05 NOTE — Telephone Encounter (Signed)
Called patient and gave number to [facility] to ask about her medication being delivered

## 2022-11-07 DIAGNOSIS — G20A1 Parkinson's disease without dyskinesia, without mention of fluctuations: Secondary | ICD-10-CM | POA: Diagnosis not present

## 2022-11-07 DIAGNOSIS — M6281 Muscle weakness (generalized): Secondary | ICD-10-CM | POA: Diagnosis not present

## 2022-11-07 DIAGNOSIS — G2581 Restless legs syndrome: Secondary | ICD-10-CM | POA: Diagnosis not present

## 2022-11-07 DIAGNOSIS — R2681 Unsteadiness on feet: Secondary | ICD-10-CM | POA: Diagnosis not present

## 2022-11-12 DIAGNOSIS — G20A1 Parkinson's disease without dyskinesia, without mention of fluctuations: Secondary | ICD-10-CM | POA: Diagnosis not present

## 2022-11-12 DIAGNOSIS — G2581 Restless legs syndrome: Secondary | ICD-10-CM | POA: Diagnosis not present

## 2022-11-12 DIAGNOSIS — R2681 Unsteadiness on feet: Secondary | ICD-10-CM | POA: Diagnosis not present

## 2022-11-12 DIAGNOSIS — M6281 Muscle weakness (generalized): Secondary | ICD-10-CM | POA: Diagnosis not present

## 2022-11-14 DIAGNOSIS — G2581 Restless legs syndrome: Secondary | ICD-10-CM | POA: Diagnosis not present

## 2022-11-14 DIAGNOSIS — G20A1 Parkinson's disease without dyskinesia, without mention of fluctuations: Secondary | ICD-10-CM | POA: Diagnosis not present

## 2022-11-14 DIAGNOSIS — R2681 Unsteadiness on feet: Secondary | ICD-10-CM | POA: Diagnosis not present

## 2022-11-14 DIAGNOSIS — M6281 Muscle weakness (generalized): Secondary | ICD-10-CM | POA: Diagnosis not present

## 2022-11-19 DIAGNOSIS — R2681 Unsteadiness on feet: Secondary | ICD-10-CM | POA: Diagnosis not present

## 2022-11-19 DIAGNOSIS — G2581 Restless legs syndrome: Secondary | ICD-10-CM | POA: Diagnosis not present

## 2022-11-19 DIAGNOSIS — G20A1 Parkinson's disease without dyskinesia, without mention of fluctuations: Secondary | ICD-10-CM | POA: Diagnosis not present

## 2022-11-19 DIAGNOSIS — M6281 Muscle weakness (generalized): Secondary | ICD-10-CM | POA: Diagnosis not present

## 2022-11-26 DIAGNOSIS — R2681 Unsteadiness on feet: Secondary | ICD-10-CM | POA: Diagnosis not present

## 2022-11-26 DIAGNOSIS — G2581 Restless legs syndrome: Secondary | ICD-10-CM | POA: Diagnosis not present

## 2022-11-26 DIAGNOSIS — M6281 Muscle weakness (generalized): Secondary | ICD-10-CM | POA: Diagnosis not present

## 2022-11-26 DIAGNOSIS — G20A1 Parkinson's disease without dyskinesia, without mention of fluctuations: Secondary | ICD-10-CM | POA: Diagnosis not present

## 2022-11-28 DIAGNOSIS — K2100 Gastro-esophageal reflux disease with esophagitis, without bleeding: Secondary | ICD-10-CM | POA: Diagnosis not present

## 2022-11-28 DIAGNOSIS — R059 Cough, unspecified: Secondary | ICD-10-CM | POA: Diagnosis not present

## 2022-11-28 DIAGNOSIS — Z23 Encounter for immunization: Secondary | ICD-10-CM | POA: Diagnosis not present

## 2022-12-03 DIAGNOSIS — M6281 Muscle weakness (generalized): Secondary | ICD-10-CM | POA: Diagnosis not present

## 2022-12-03 DIAGNOSIS — G2581 Restless legs syndrome: Secondary | ICD-10-CM | POA: Diagnosis not present

## 2022-12-03 DIAGNOSIS — R2681 Unsteadiness on feet: Secondary | ICD-10-CM | POA: Diagnosis not present

## 2022-12-03 DIAGNOSIS — G20A1 Parkinson's disease without dyskinesia, without mention of fluctuations: Secondary | ICD-10-CM | POA: Diagnosis not present

## 2022-12-10 DIAGNOSIS — M6281 Muscle weakness (generalized): Secondary | ICD-10-CM | POA: Diagnosis not present

## 2022-12-10 DIAGNOSIS — R2681 Unsteadiness on feet: Secondary | ICD-10-CM | POA: Diagnosis not present

## 2022-12-10 DIAGNOSIS — G2581 Restless legs syndrome: Secondary | ICD-10-CM | POA: Diagnosis not present

## 2022-12-10 DIAGNOSIS — G20A1 Parkinson's disease without dyskinesia, without mention of fluctuations: Secondary | ICD-10-CM | POA: Diagnosis not present

## 2022-12-17 DIAGNOSIS — G2581 Restless legs syndrome: Secondary | ICD-10-CM | POA: Diagnosis not present

## 2022-12-17 DIAGNOSIS — G20A1 Parkinson's disease without dyskinesia, without mention of fluctuations: Secondary | ICD-10-CM | POA: Diagnosis not present

## 2022-12-17 DIAGNOSIS — R2681 Unsteadiness on feet: Secondary | ICD-10-CM | POA: Diagnosis not present

## 2022-12-17 DIAGNOSIS — M6281 Muscle weakness (generalized): Secondary | ICD-10-CM | POA: Diagnosis not present

## 2022-12-24 DIAGNOSIS — G2581 Restless legs syndrome: Secondary | ICD-10-CM | POA: Diagnosis not present

## 2022-12-24 DIAGNOSIS — G20A1 Parkinson's disease without dyskinesia, without mention of fluctuations: Secondary | ICD-10-CM | POA: Diagnosis not present

## 2022-12-24 DIAGNOSIS — M6281 Muscle weakness (generalized): Secondary | ICD-10-CM | POA: Diagnosis not present

## 2022-12-24 DIAGNOSIS — R2681 Unsteadiness on feet: Secondary | ICD-10-CM | POA: Diagnosis not present

## 2022-12-30 NOTE — Progress Notes (Unsigned)
Assessment/Plan:   1.  Parkinsons Disease, dx 2017 by [facility] and followed for years at [facility]  -continue carbidopa/levodopa 25/100, 1.5 tablets at 8 AM/noon/4 PM.  Had sleepiness with higher dosages.  Difficult to tell just how effective, as has not yet taken it today.  Nonetheless, she reports she feels much better on it than the CR that she was on.  -Continue ropinirole, 1 mg, 1 tablet at 8 AM/noon/4 PM (she actually has some 2 mg of the requip and she is going to take 1/2 tab three times per day until those run out)  -We discussed that it used to be thought that levodopa would increase risk of melanoma but now it is believed that Parkinsons itself likely increases risk of melanoma. she is to get regular skin checks.  -Discussed value of exercise today.   2.  Constipation  -On Linzess   Subjective:   [name] [name] was seen today in follow up for Parkinsons disease.  My previous records were reviewed prior to todays visit as well as outside records available to me.  Pt with sister who supplements hx.  Last visit, she was taking carbidopa/levodopa 50/200 three times per day and I decided to change her to the immediate release for better bioavailability.  It worked well, but she found herself more sleepy and I backed off on the dose from 2 tablets 3 times per day to 1.5 tablets 3 times per day.  She is not sleepy and is moving better and is getting around better.  She is not having much tremor.  She feels mentally more "sharp."  She just finished PT and is getting ready to get back to the [facility].   Pt denies falls.  Pt denies lightheadedness, near syncope.  No hallucinations.  Mood has been good.  Current prescribed movement disorder medications: Carbidopa/levodopa 25/100, 1.5 tablets 3 times per day at 8 AM/noon/4 PM (changed last time from 50/200 3 times daily) Ropinirole 1 mg, 1 tablet at 8 AM/noon/4 PM   PREVIOUS MEDICATIONS: Cogentin; ropinirole; carbidopa/levodopa 50/200 (she tolerated this  well, I just wanted to change her off of this in the daytime)  ALLERGIES:   Allergies  Allergen Reactions   Iodine Swelling   Iohexol Swelling     Desc: FACIAL SWELLING and temporary blindness    Penicillins Itching and Swelling   Shellfish Allergy Swelling   Caffeine Other (See Comments)    Palpitations and pain    Gabapentin Other (See Comments)    Insomnia    Sulfa Drugs Cross Reactors Nausea And Vomiting   Tramadol Nausea And Vomiting    CURRENT MEDICATIONS:  Current Meds  Medication Sig   albuterol (VENTOLIN HFA) 108 (90 Base) MCG/ACT inhaler Inhale 2 puffs into the lungs every 6 (six) hours as needed for wheezing or shortness of breath.   Ascorbic Acid (VITAMIN C PO) Take by mouth.   atorvastatin (LIPITOR) 20 MG tablet Take 20 mg by mouth daily.   Calcium Carb-Cholecalciferol (CALCIUM 600 + D PO) Take 2 tablets by mouth daily.   carbidopa-levodopa (SINEMET IR) 25-100 MG tablet Take 2 tablets by mouth 3 (three) times daily. 8am/noon/4pm   Cholecalciferol (VITAMIN D) 50 MCG (2000 UT) CAPS Take 1 each by mouth daily.   LINZESS 145 MCG CAPS capsule Take 145 mcg by mouth daily.   Naproxen Sodium (ALEVE PO) Take by mouth as needed.   pantoprazole (PROTONIX) 40 MG tablet Take 40 mg by mouth daily.   rOPINIRole (REQUIP)  0.5 MG tablet Take 1 mg by mouth 3 (three) times daily at 8 AM/noon/4 PM.   vitamin B-12 (CYANOCOBALAMIN) 1000 MCG tablet Take 1,000 mcg by mouth daily.     Objective:   PHYSICAL EXAMINATION:    VITALS:   Vitals:   01/01/23 0805  BP: 118/72  Pulse: 72  SpO2: 96%  Weight: 158 lb 12.8 oz (72 kg)  Height: 5\' 6"  (1.676 m)    GEN:  The patient appears stated age and is in NAD. HEENT:  Normocephalic, atraumatic.  The mucous membranes are moist. The superficial temporal arteries are without ropiness or tenderness. CV:  RRR Lungs:  CTAB Neck/HEME:  There are no carotid bruits bilaterally.  Neurological examination:  Orientation: The patient is alert and  oriented x3. Cranial nerves: There is good facial symmetry without facial hypomimia. The speech is fluent and clear. Soft palate rises symmetrically and there is no tongue deviation. Hearing is intact to conversational tone. Sensation: Sensation is intact to light touch throughout Motor: Strength is at least antigravity x4.  Seen at 8:15 AM today and had not taken medication since 4 PM yesterday  Movement examination: Tone: There is mild increased tone in the LUE Abnormal movements: Mild left upper extremity rest tremor Coordination:  There is  decremation with RAM's, with any form of RAMS, including alternating supination and pronation of the forearm, hand opening and closing, finger taps, heel taps and toe taps, on the L, esp in the LE (unable to do hand opening and closing on the L due to trigger finger) Gait and Station: The patient easily arises without the use of her hands.  She does not shuffle, but she is wide-based and ataxic.  She is unsteady.  I have reviewed and interpreted the following labs independently    Chemistry      Component Value Date/Time   NA 133 (L) 03/03/2019 0527   K 4.1 03/03/2019 0527   CL 97 (L) 03/03/2019 0527   CO2 24 03/03/2019 0527   BUN 15 03/03/2019 0527   CREATININE 0.62 03/03/2019 0527   CREATININE 0.72 04/22/2016 1616      Component Value Date/Time   CALCIUM 9.4 03/03/2019 0527   ALKPHOS 57 03/03/2019 0527   AST 27 03/03/2019 0527   ALT 59 (H) 03/03/2019 0527   BILITOT 0.9 03/03/2019 0527       Lab Results  Component Value Date   WBC 9.0 03/03/2019   HGB 12.4 03/03/2019   HCT 38.0 03/03/2019   MCV 93.1 03/03/2019   PLT 288 03/03/2019    Lab Results  Component Value Date   TSH 1.83 04/22/2016     Total time spent on today's visit was 30 minutes, including both face-to-face time and nonface-to-face time.  Time included that spent on review of records (prior notes available to me/labs/imaging if pertinent), discussing treatment  and goals, answering patient's questions and coordinating care.  Cc:  Richardean Chimera, MD

## 2023-01-01 ENCOUNTER — Ambulatory Visit (INDEPENDENT_AMBULATORY_CARE_PROVIDER_SITE_OTHER): Payer: Medicare Other | Admitting: Neurology

## 2023-01-01 DIAGNOSIS — G2581 Restless legs syndrome: Secondary | ICD-10-CM

## 2023-01-01 DIAGNOSIS — G20A1 Parkinson's disease without dyskinesia, without mention of fluctuations: Secondary | ICD-10-CM | POA: Diagnosis not present

## 2023-01-01 MED ORDER — CARBIDOPA-LEVODOPA 25-100 MG PO TABS: 1.5000 | ORAL_TABLET | Freq: Three times a day (TID) | ORAL | Status: DC

## 2023-01-01 MED ORDER — ROPINIROLE HCL 1 MG PO TABS: 1.0000 mg | ORAL_TABLET | Freq: Three times a day (TID) | ORAL | Status: DC

## 2023-01-01 NOTE — Patient Instructions (Signed)
Here are some resources/books that you may find helpful as you navigate the challenges of Parkinson's Disease  1.  Parkinson's treatement: 10 secrets to a happier life by Jefferey Pica, MD 2.  Navigating Life with Parkinsons disease by Sotirios Parashos 3.  My degeneration: A journey through Parkinsons Ledora Bottcher - Shohl) 4.  Every Victory counts (I believe this one if free through [employer]) 5.  Lucky Man by Gardner Candle 6.  101 Questions & Answers about Parkinson's by Caprice Renshaw 7.  Parkinsons Disease Treatment Book by JE Ahiskog

## 2023-01-07 DIAGNOSIS — I10 Essential (primary) hypertension: Secondary | ICD-10-CM | POA: Diagnosis not present

## 2023-01-07 DIAGNOSIS — E559 Vitamin D deficiency, unspecified: Secondary | ICD-10-CM | POA: Diagnosis not present

## 2023-01-07 DIAGNOSIS — E039 Hypothyroidism, unspecified: Secondary | ICD-10-CM | POA: Diagnosis not present

## 2023-01-07 DIAGNOSIS — E7849 Other hyperlipidemia: Secondary | ICD-10-CM | POA: Diagnosis not present

## 2023-01-07 DIAGNOSIS — G20A1 Parkinson's disease without dyskinesia, without mention of fluctuations: Secondary | ICD-10-CM | POA: Diagnosis not present

## 2023-01-14 DIAGNOSIS — I10 Essential (primary) hypertension: Secondary | ICD-10-CM | POA: Diagnosis not present

## 2023-01-14 DIAGNOSIS — E7849 Other hyperlipidemia: Secondary | ICD-10-CM | POA: Diagnosis not present

## 2023-01-14 DIAGNOSIS — M25519 Pain in unspecified shoulder: Secondary | ICD-10-CM | POA: Diagnosis not present

## 2023-01-14 DIAGNOSIS — G20A1 Parkinson's disease without dyskinesia, without mention of fluctuations: Secondary | ICD-10-CM | POA: Diagnosis not present

## 2023-01-14 DIAGNOSIS — M1990 Unspecified osteoarthritis, unspecified site: Secondary | ICD-10-CM | POA: Diagnosis not present

## 2023-01-14 DIAGNOSIS — K5900 Constipation, unspecified: Secondary | ICD-10-CM | POA: Diagnosis not present

## 2023-03-05 DIAGNOSIS — M25512 Pain in left shoulder: Secondary | ICD-10-CM | POA: Diagnosis not present

## 2023-03-20 ENCOUNTER — Ambulatory Visit: Payer: BC Managed Care – PPO | Admitting: Adult Health

## 2023-03-21 DIAGNOSIS — H43811 Vitreous degeneration, right eye: Secondary | ICD-10-CM | POA: Diagnosis not present

## 2023-03-21 DIAGNOSIS — H524 Presbyopia: Secondary | ICD-10-CM | POA: Diagnosis not present

## 2023-04-21 DIAGNOSIS — Z131 Encounter for screening for diabetes mellitus: Secondary | ICD-10-CM | POA: Diagnosis not present

## 2023-04-21 DIAGNOSIS — E7849 Other hyperlipidemia: Secondary | ICD-10-CM | POA: Diagnosis not present

## 2023-04-21 DIAGNOSIS — E559 Vitamin D deficiency, unspecified: Secondary | ICD-10-CM | POA: Diagnosis not present

## 2023-04-21 DIAGNOSIS — Z1329 Encounter for screening for other suspected endocrine disorder: Secondary | ICD-10-CM | POA: Diagnosis not present

## 2023-04-28 DIAGNOSIS — K2100 Gastro-esophageal reflux disease with esophagitis, without bleeding: Secondary | ICD-10-CM | POA: Diagnosis not present

## 2023-04-28 DIAGNOSIS — Z9189 Other specified personal risk factors, not elsewhere classified: Secondary | ICD-10-CM | POA: Diagnosis not present

## 2023-04-28 DIAGNOSIS — E782 Mixed hyperlipidemia: Secondary | ICD-10-CM | POA: Diagnosis not present

## 2023-04-28 DIAGNOSIS — M1990 Unspecified osteoarthritis, unspecified site: Secondary | ICD-10-CM | POA: Diagnosis not present

## 2023-04-28 DIAGNOSIS — Z1231 Encounter for screening mammogram for malignant neoplasm of breast: Secondary | ICD-10-CM | POA: Diagnosis not present

## 2023-04-28 DIAGNOSIS — I10 Essential (primary) hypertension: Secondary | ICD-10-CM | POA: Diagnosis not present

## 2023-04-28 DIAGNOSIS — E7849 Other hyperlipidemia: Secondary | ICD-10-CM | POA: Diagnosis not present

## 2023-04-28 DIAGNOSIS — Z0001 Encounter for general adult medical examination with abnormal findings: Secondary | ICD-10-CM | POA: Diagnosis not present

## 2023-05-08 ENCOUNTER — Telehealth: Payer: Self-pay | Admitting: Neurology

## 2023-05-08 ENCOUNTER — Other Ambulatory Visit: Payer: Self-pay

## 2023-05-08 DIAGNOSIS — G2581 Restless legs syndrome: Secondary | ICD-10-CM

## 2023-05-08 DIAGNOSIS — G20A1 Parkinson's disease without dyskinesia, without mention of fluctuations: Secondary | ICD-10-CM

## 2023-05-08 MED ORDER — ROPINIROLE HCL 1 MG PO TABS: 1.0000 mg | ORAL_TABLET | Freq: Three times a day (TID) | ORAL | 0 refills | Status: DC

## 2023-05-08 MED ORDER — CARBIDOPA-LEVODOPA 25-100 MG PO TABS: 1.5000 | ORAL_TABLET | Freq: Three times a day (TID) | ORAL | 0 refills | Status: DC

## 2023-05-08 NOTE — Telephone Encounter (Signed)
 Pharmacy is not allowing Refills RX Carbidopa-levodopa and rOPINIRole, please contact and advise [phone]

## 2023-05-08 NOTE — Telephone Encounter (Signed)
 Meds have been sent to [facility] [place] and patient has been called

## 2023-05-14 DIAGNOSIS — Z6824 Body mass index (BMI) 24.0-24.9, adult: Secondary | ICD-10-CM | POA: Diagnosis not present

## 2023-05-20 NOTE — Progress Notes (Unsigned)
 Assessment/Plan:   1.  Parkinsons Disease, dx 2017 by [facility] and followed for years at [facility]  -increase carbidopa/levodopa 25/100, 2 tablets at 8 AM/noon/4 PM.  Had sleepiness with these dosages in past but going to try this.    -Continue ropinirole, 1 mg, 1 tablet at 8 AM/noon/4 PM (she is going to check the dosage as she was taking 2 mg - 1/2 tablet but she may be splitting the 1mg , 1/2 tablet now which is not what we want)  -We discussed that it used to be thought that levodopa would increase risk of melanoma but now it is believed that Parkinsons itself likely increases risk of melanoma. she is to get regular skin checks.  -Discussed value of exercise today.   2.  Constipation  -On Linzess   Subjective:   [name] [name] was seen today in follow up for Parkinsons disease.  My previous records were reviewed prior to todays visit as well as outside records available to me.  Pt with sister who supplements hx. patient is taking her levodopa faithfully.  She has had no falls.  More freezing over the last few weeks.  More shuffling over the last few weeks.   No lightheadedness or near syncope.  No hallucinations with no compulsive behaviors or sleep attacks.  Current prescribed movement disorder medications: Carbidopa/levodopa 25/100, 1.5 tablets 3 times per day at 8 AM/noon/4 PM  Ropinirole 1 mg, 1 tablet at 8 AM/noon/4 PM   PREVIOUS MEDICATIONS: Cogentin; ropinirole; carbidopa/levodopa 50/200 (she tolerated this well, I just wanted to change her off of this in the daytime)  ALLERGIES:   Allergies  Allergen Reactions   Iodine Swelling   Iohexol Swelling     Desc: FACIAL SWELLING and temporary blindness    Penicillins Itching and Swelling   Shellfish Allergy Swelling   Caffeine Other (See Comments)    Palpitations and pain    Gabapentin Other (See Comments)    Insomnia    Hydrocodone-Acetaminophen Itching   Sulfa Drugs Cross Reactors Nausea And Vomiting   Tramadol Nausea And  Vomiting    CURRENT MEDICATIONS:  Current Meds  Medication Sig   albuterol (VENTOLIN HFA) 108 (90 Base) MCG/ACT inhaler Inhale 2 puffs into the lungs every 6 (six) hours as needed for wheezing or shortness of breath.   Ascorbic Acid (VITAMIN C PO) Take by mouth.   atorvastatin (LIPITOR) 20 MG tablet Take 20 mg by mouth daily.   Calcium Carb-Cholecalciferol (CALCIUM 600 + D PO) Take 2 tablets by mouth daily.   carbidopa-levodopa (SINEMET IR) 25-100 MG tablet Take 1.5 tablets by mouth 3 (three) times daily. 8am/noon/4pm   Cholecalciferol (VITAMIN D) 50 MCG (2000 UT) CAPS Take 1 each by mouth daily.   LINZESS 145 MCG CAPS capsule Take 145 mcg by mouth daily.   Naproxen Sodium (ALEVE PO) Take by mouth as needed.   pantoprazole (PROTONIX) 40 MG tablet Take 40 mg by mouth daily.   rOPINIRole (REQUIP) 1 MG tablet Take 1 tablet (1 mg total) by mouth 3 (three) times daily. (Patient taking differently: Take 1 mg by mouth 3 (three) times daily. 1/2 tab tid)   vitamin B-12 (CYANOCOBALAMIN) 1000 MCG tablet Take 1,000 mcg by mouth daily.     Objective:   PHYSICAL EXAMINATION:    VITALS:   Vitals:   05/21/23 0824  BP: (!) 142/92  Pulse: 82  SpO2: 95%  Weight: 158 lb 6.4 oz (71.8 kg)   GEN:  The patient appears stated  age and is in NAD. HEENT:  Normocephalic, atraumatic.  The mucous membranes are moist. The superficial temporal arteries are without ropiness or tenderness. CV:  RRR Lungs:  CTAB Neck/HEME:  There are no carotid bruits bilaterally.  Neurological examination:  Orientation: The patient is alert and oriented x3. Cranial nerves: There is good facial symmetry without facial hypomimia. The speech is fluent and clear. Soft palate rises symmetrically and there is no tongue deviation. Hearing is intact to conversational tone. Sensation: Sensation is intact to light touch throughout Motor: Strength is at least antigravity x4.  Seen at 8:30 AM today and last medication at  8am  Movement examination: Tone: There is nl tone today Abnormal movements: Mild left upper extremity rest tremor, intermittent (stable) Coordination:  There is mild decremation with foot taps on the L Gait and Station: The patient easily arises without the use of her hands.  She does not shuffle, but she is wide-based and ataxic.  She slightly drags the L leg.  She is mildly unsteady.  I have reviewed and interpreted the following labs independently    Chemistry      Component Value Date/Time   NA 133 (L) 03/03/2019 0527   K 4.1 03/03/2019 0527   CL 97 (L) 03/03/2019 0527   CO2 24 03/03/2019 0527   BUN 15 03/03/2019 0527   CREATININE 0.62 03/03/2019 0527   CREATININE 0.72 04/22/2016 1616      Component Value Date/Time   CALCIUM 9.4 03/03/2019 0527   ALKPHOS 57 03/03/2019 0527   AST 27 03/03/2019 0527   ALT 59 (H) 03/03/2019 0527   BILITOT 0.9 03/03/2019 0527       Lab Results  Component Value Date   WBC 9.0 03/03/2019   HGB 12.4 03/03/2019   HCT 38.0 03/03/2019   MCV 93.1 03/03/2019   PLT 288 03/03/2019    Lab Results  Component Value Date   TSH 1.83 04/22/2016     Total time spent on today's visit was 30 minutes, including both face-to-face time and nonface-to-face time.  Time included that spent on review of records (prior notes available to me/labs/imaging if pertinent), discussing treatment and goals, answering patient's questions and coordinating care.  Cc:  Richardean Chimera, MD

## 2023-05-21 ENCOUNTER — Encounter: Payer: Self-pay | Admitting: Neurology

## 2023-05-21 ENCOUNTER — Ambulatory Visit: Admitting: Neurology

## 2023-05-21 DIAGNOSIS — G20A1 Parkinson's disease without dyskinesia, without mention of fluctuations: Secondary | ICD-10-CM | POA: Diagnosis not present

## 2023-05-21 DIAGNOSIS — G2581 Restless legs syndrome: Secondary | ICD-10-CM | POA: Diagnosis not present

## 2023-05-21 MED ORDER — CARBIDOPA-LEVODOPA 25-100 MG PO TABS: 2.0000 | ORAL_TABLET | Freq: Three times a day (TID) | ORAL | Status: DC

## 2023-05-21 NOTE — Patient Instructions (Addendum)
 Increase carbidopa/levodopa 25/100, 2 tablets at 8am/noon/4pm Check on your dosage of ropinirole.  I want you to be taking 1 mg three times per day!  If you have 1 mg tablet, then it will be a full tablet three times per day but if its a 2mg  tablet then its a 1/2 tablet three times per day  The physicians and staff at [facility] are committed to providing excellent care. You may receive a survey requesting feedback about your experience at our office. We strive to receive "very good" responses to the survey questions. If you feel that your experience would prevent you from giving the office a "very good " response, please contact our office to try to remedy the situation. We may be reached at [phone]. Thank you for taking the time out of your busy day to complete the survey.

## 2023-06-13 DIAGNOSIS — L738 Other specified follicular disorders: Secondary | ICD-10-CM | POA: Diagnosis not present

## 2023-06-16 DIAGNOSIS — Z1211 Encounter for screening for malignant neoplasm of colon: Secondary | ICD-10-CM | POA: Diagnosis not present

## 2023-07-01 ENCOUNTER — Ambulatory Visit: Payer: Self-pay | Admitting: Neurology

## 2023-07-08 DIAGNOSIS — Z1231 Encounter for screening mammogram for malignant neoplasm of breast: Secondary | ICD-10-CM | POA: Diagnosis not present

## 2023-07-10 DIAGNOSIS — D485 Neoplasm of uncertain behavior of skin: Secondary | ICD-10-CM | POA: Diagnosis not present

## 2023-07-25 ENCOUNTER — Telehealth: Payer: Self-pay | Admitting: Neurology

## 2023-07-25 NOTE — Telephone Encounter (Signed)
 Pt. Needs Rx refill

## 2023-07-25 NOTE — Telephone Encounter (Signed)
 Called patient and left a message for a call back. Need to know what medication she is needing a refill of.

## 2023-07-28 ENCOUNTER — Other Ambulatory Visit: Payer: Self-pay

## 2023-07-28 ENCOUNTER — Telehealth: Payer: Self-pay | Admitting: Neurology

## 2023-07-28 DIAGNOSIS — G20A1 Parkinson's disease without dyskinesia, without mention of fluctuations: Secondary | ICD-10-CM

## 2023-07-28 DIAGNOSIS — G2581 Restless legs syndrome: Secondary | ICD-10-CM

## 2023-07-28 MED ORDER — ROPINIROLE HCL 1 MG PO TABS: 1.0000 mg | ORAL_TABLET | Freq: Three times a day (TID) | ORAL | 0 refills | Status: DC

## 2023-07-28 MED ORDER — CARBIDOPA-LEVODOPA 25-100 MG PO TABS: 2.0000 | ORAL_TABLET | Freq: Three times a day (TID) | ORAL | Status: DC

## 2023-07-28 NOTE — Telephone Encounter (Signed)
 Patient needs to speak to someone about her medication refill  on the Carbidopa  levodopa  and the ropinirole   [facility]

## 2023-07-28 NOTE — Telephone Encounter (Signed)
 Called and left message to call office

## 2023-07-29 ENCOUNTER — Other Ambulatory Visit: Payer: Self-pay

## 2023-07-29 DIAGNOSIS — G20A1 Parkinson's disease without dyskinesia, without mention of fluctuations: Secondary | ICD-10-CM

## 2023-07-29 DIAGNOSIS — G2581 Restless legs syndrome: Secondary | ICD-10-CM

## 2023-07-29 MED ORDER — CARBIDOPA-LEVODOPA 25-100 MG PO TABS: 2.0000 | ORAL_TABLET | Freq: Three times a day (TID) | ORAL | 0 refills | Status: DC

## 2023-07-29 NOTE — Telephone Encounter (Signed)
 Pt called in and just spoke with Renee. The pt's pharmacy says they have not gotten the prescription for her carbidopa -levodopa 

## 2023-07-30 ENCOUNTER — Other Ambulatory Visit: Payer: Self-pay

## 2023-07-30 DIAGNOSIS — G2581 Restless legs syndrome: Secondary | ICD-10-CM

## 2023-07-30 DIAGNOSIS — G20A1 Parkinson's disease without dyskinesia, without mention of fluctuations: Secondary | ICD-10-CM

## 2023-07-30 MED ORDER — CARBIDOPA-LEVODOPA 25-100 MG PO TABS: 2.0000 | ORAL_TABLET | Freq: Three times a day (TID) | ORAL | 0 refills | Status: DC

## 2023-08-05 ENCOUNTER — Telehealth: Payer: Self-pay | Admitting: Neurology

## 2023-08-05 NOTE — Telephone Encounter (Signed)
 Pt. Is having difficulty moving and walking would a nurse call for help

## 2023-08-06 NOTE — Telephone Encounter (Signed)
 Called patient and this is a new onset of symptoms starting last Wednesday she ususally goes to the [facility] and to her other exercise classes. She had statred with her Otho Blitz but was able to walk with no assistance until Wednesday. Pateitn feels uncomfortable to drive given her issues with hter legs. Patient ran out of he levodopa  on Monday due to getting her pharmcy to deliver her meds but she feels her symptoms statred before her meds ran out. Patient has blood work done every 3 months and has not been screened for a UTI but feels she is having no other issues. Patient is still on Requip .

## 2023-08-06 NOTE — Telephone Encounter (Signed)
 Called patient and she is going to see PCP tomorrow for a UTI screening and will let me know the results.

## 2023-08-07 DIAGNOSIS — R5383 Other fatigue: Secondary | ICD-10-CM | POA: Diagnosis not present

## 2023-08-07 DIAGNOSIS — Z6823 Body mass index (BMI) 23.0-23.9, adult: Secondary | ICD-10-CM | POA: Diagnosis not present

## 2023-08-07 DIAGNOSIS — R300 Dysuria: Secondary | ICD-10-CM | POA: Diagnosis not present

## 2023-08-07 DIAGNOSIS — R531 Weakness: Secondary | ICD-10-CM | POA: Diagnosis not present

## 2023-08-11 ENCOUNTER — Telehealth: Payer: Self-pay | Admitting: Neurology

## 2023-08-11 NOTE — Telephone Encounter (Signed)
 Pt called in stating she had gone to her PCP last week and her labs were normal and she does not have a UTI. She feels like cement is in her feet. She says she is feeling worse and would like to know what she should do?

## 2023-08-12 NOTE — Telephone Encounter (Signed)
 Patient is back on levodopa . She said out of 10 with 10 being the best she feels a 2 at this time better but still struggling to walk. She is done with PT but would really like to go back. She is taking levodopa  at 8/12/4

## 2023-08-14 NOTE — Progress Notes (Unsigned)
 Assessment/Plan:   1.  Parkinsons Disease, dx 2017 by [facility] and followed for years at [facility]  -increase carbidopa /levodopa  25/100, 2 tablets at 7am/2 at 10am/2 at 1pm/1 at 4pm (she goes to bed at 7pm)   -RX inbrija .  Samples provided.  Use at least for first morning on and up to 4 other times in the day.  -Continue ropinirole , 1 mg, 1 tablet at 8 AM/noon/4 PM   -consider adding carbidopa /levodopa  50/200 CR next visit q hs -refer to PT  -We discussed that it used to be thought that levodopa  would increase risk of melanoma but now it is believed that Parkinsons itself likely increases risk of melanoma. she is to get regular skin checks.  -Discussed value of exercise today.   2.  Constipation  -On Linzess   Subjective:   [name] [name] was seen today in follow up for Parkinsons disease.  My previous records were reviewed prior to todays visit as well as outside records available to me.  Pt with sister who supplements hx. Daughter on phone.  Patient worked in today.  She called recently stating she had run out of her levodopa  and the pharmacy did not get it to her and she really was not able to walk well.  She had to start walking with her walker.  She was not able to drive.  She was still on her ropinirole .  I told her to see how she did once her levodopa  came in, but she was apparently going to call her primary care physician as well.  She apparently did that and saw her primary care (I do not have any of those notes) and had lab work and apparently all looked good.  She got back on her levodopa , but still did not feel completely back to normal.  She was struggling to walk.  She was worked in today to examine and discuss further.  She has trouble at night with being able to move when she gets up to urinate 20,000 times.  She is awake at night and having anxiety attacks.  She cannot say how long it takes for her medication to kick in.  Last PT was about a year ago.  She goes to the [facility] in [place]  and does balance, core, tai chi and some biking.    Current prescribed movement disorder medications: Carbidopa /levodopa  25/100, 2 tablets 3 times per day at 8 AM/noon/4 PM (increased last visit) Ropinirole  1 mg, 1 tablet at 8 AM/noon/4 PM   PREVIOUS MEDICATIONS: Cogentin ; ropinirole ; carbidopa /levodopa  50/200 (she tolerated this well, I just wanted to change her off of this in the daytime)  ALLERGIES:   Allergies  Allergen Reactions   Iodine Swelling   Iohexol  Swelling     Desc: FACIAL SWELLING and temporary blindness    Penicillins Itching and Swelling   Shellfish Allergy  Swelling   Caffeine Other (See Comments)    Palpitations and pain    Gabapentin Other (See Comments)    Insomnia    Hydrocodone-Acetaminophen  Itching   Sulfa Drugs Cross Reactors Nausea And Vomiting   Tramadol  Nausea And Vomiting    CURRENT MEDICATIONS:  Current Meds  Medication Sig   albuterol (VENTOLIN HFA) 108 (90 Base) MCG/ACT inhaler Inhale 2 puffs into the lungs every 6 (six) hours as needed for wheezing or shortness of breath.   Ascorbic Acid (VITAMIN C PO) Take by mouth.   atorvastatin (LIPITOR) 20 MG tablet Take 20 mg by mouth daily.   Calcium Carb-Cholecalciferol (  CALCIUM 600 + D PO) Take 2 tablets by mouth daily.   Cholecalciferol (VITAMIN D) 50 MCG (2000 UT) CAPS Take 1 each by mouth daily.   LINZESS 145 MCG CAPS capsule Take 145 mcg by mouth daily.   Naproxen Sodium (ALEVE PO) Take by mouth as needed.   pantoprazole  (PROTONIX ) 40 MG tablet Take 40 mg by mouth daily.   rOPINIRole  (REQUIP ) 1 MG tablet Take 1 tablet (1 mg total) by mouth 3 (three) times daily.   vitamin B-12 (CYANOCOBALAMIN) 1000 MCG tablet Take 1,000 mcg by mouth daily.   [DISCONTINUED] carbidopa -levodopa  (SINEMET  IR) 25-100 MG tablet Take 2 tablets by mouth 3 (three) times daily. 8am/noon/4pm     Objective:   PHYSICAL EXAMINATION:    VITALS:   Vitals:   08/18/23 0845  BP: 132/80  Pulse: 91  SpO2: 96%  Weight: 157  lb (71.2 kg)    GEN:  The patient appears stated age and is in NAD. HEENT:  Normocephalic, atraumatic.  The mucous membranes are moist. The superficial temporal arteries are without ropiness or tenderness. CV:  RRR Lungs:  CTAB Neck/HEME:  There are no carotid bruits bilaterally.  Neurological examination:  Orientation: The patient is alert and oriented x3. Cranial nerves: There is good facial symmetry without facial hypomimia. The speech is fluent and clear. Soft palate rises symmetrically and there is no tongue deviation. Hearing is intact to conversational tone. Sensation: Sensation is intact to light touch throughout Motor: Strength is at least antigravity x4.   Movement examination: Tone: There is nl tone today Abnormal movements: Mild left and rare LLE rest tremor Coordination:  There is trouble with hand opening and closing but that is arthritic in nature.  Toe taps are slow on the L.  Other RAMs are good Gait and Station: The patient pushes off the transport chair (but it leans back).  She holds lightly onto the examiner.  She doesn't shuffle but the more she walks the more short stepped she becomes.  She is slightly unsteady esp in the turn.  I have reviewed and interpreted the following labs independently    Chemistry      Component Value Date/Time   NA 133 (L) 03/03/2019 0527   K 4.1 03/03/2019 0527   CL 97 (L) 03/03/2019 0527   CO2 24 03/03/2019 0527   BUN 15 03/03/2019 0527   CREATININE 0.62 03/03/2019 0527   CREATININE 0.72 04/22/2016 1616      Component Value Date/Time   CALCIUM 9.4 03/03/2019 0527   ALKPHOS 57 03/03/2019 0527   AST 27 03/03/2019 0527   ALT 59 (H) 03/03/2019 0527   BILITOT 0.9 03/03/2019 0527       Lab Results  Component Value Date   WBC 9.0 03/03/2019   HGB 12.4 03/03/2019   HCT 38.0 03/03/2019   MCV 93.1 03/03/2019   PLT 288 03/03/2019    Lab Results  Component Value Date   TSH 1.83 04/22/2016     Total time spent on  today's visit was 30 minutes, including both face-to-face time and nonface-to-face time.  Time included that spent on review of records (prior notes available to me/labs/imaging if pertinent), discussing treatment and goals, answering patient's questions and coordinating care.  Cc:  Toribio Jerel MATSU, MD

## 2023-08-15 DIAGNOSIS — Z6824 Body mass index (BMI) 24.0-24.9, adult: Secondary | ICD-10-CM | POA: Diagnosis not present

## 2023-08-15 DIAGNOSIS — G20B1 Parkinson's disease with dyskinesia, without mention of fluctuations: Secondary | ICD-10-CM | POA: Diagnosis not present

## 2023-08-18 ENCOUNTER — Encounter: Payer: Self-pay | Admitting: Neurology

## 2023-08-18 ENCOUNTER — Ambulatory Visit: Admitting: Neurology

## 2023-08-18 DIAGNOSIS — G20A1 Parkinson's disease without dyskinesia, without mention of fluctuations: Secondary | ICD-10-CM

## 2023-08-18 DIAGNOSIS — G2581 Restless legs syndrome: Secondary | ICD-10-CM

## 2023-08-18 MED ORDER — CARBIDOPA-LEVODOPA 25-100 MG PO TABS: ORAL_TABLET | ORAL | 2 refills | Status: AC

## 2023-08-18 NOTE — Patient Instructions (Addendum)
 We are going to start inbrija .  Remember that TWO capsules is ONE dosage (never inhale just one capsule).  You can inhale the capsules as needed up to 5 times per day, separated by 2 hour intervals.  Many patients use this right when the wake up to help with first morning on and then as needed during the day.  You should take a sip of water prior to using the inhaler to avoid side effects.  It may generate some cough right when you use it and that is normal.  There are nurse educators available to help you with this device.  You can call [phone] and they will set you up with a nurse educator to assist you for free of charge.  They are available 8am-8pm Monday-Friday.   SAVE THE DATE!  We are planning a Parkinsons Disease educational symposium at [facility] in [place] on September 19.  More details to come!  We will have a movement disorder physician expert from [employer] coming to speak and a caregiver speaker.  We will have a panel of experts that will show you who you may need on your team of people on your journey with Parkinsons.  If you would like to be added to our email list to get further information, email [email or web address].  I hope to see you there!   Increase carbidopa /levodopa  25/100, 2 tablets at 7am/2 at 10am/2 at 1pm/1 at 4pm

## 2023-09-01 ENCOUNTER — Telehealth: Payer: Self-pay | Admitting: Neurology

## 2023-09-01 NOTE — Telephone Encounter (Signed)
 1. Which medications need refilled? (List name and dosage, if known) inbrija  - she tried the samples and liked it. Now she wants a prescription  2. Which pharmacy/location is medication to be sent to? (include street and city if local pharmacy) [facility] [address] [place]

## 2023-09-02 ENCOUNTER — Other Ambulatory Visit: Payer: Self-pay

## 2023-09-02 DIAGNOSIS — G20A1 Parkinson's disease without dyskinesia, without mention of fluctuations: Secondary | ICD-10-CM

## 2023-09-02 MED ORDER — INBRIJA 42 MG IN CAPS: 42.0000 | ORAL_CAPSULE | Freq: Every day | RESPIRATORY_TRACT | 1 refills | Status: DC

## 2023-09-03 NOTE — Telephone Encounter (Signed)
 Sent in RX to inbrijia and called patient to explain next steps

## 2023-09-05 NOTE — Therapy (Signed)
 OUTPATIENT PHYSICAL THERAPY PARKINSON'S EVALUATION   Patient Name: [name] [name] MRN: [id number] DOB:September 04, 1950, 73 y.o., female Today's Date: 09/09/2023   END OF SESSION:  PT End of Session - 09/09/23 1400     Visit Number 1    Date for PT Re-Evaluation 12/02/23    Authorization Type [employer] Medicare    Authorization Time Period auth pending    PT Start Time 1400    PT Stop Time 1454    PT Time Calculation (min) 54 min    Activity Tolerance Patient tolerated treatment well    Behavior During Therapy WFL for tasks assessed/performed          Past Medical History:  Diagnosis Date   Bursitis    Cellulitis    arm   Common peroneal neuropathy, left 04/27/2019   Headache    Hyperlipidemia    Insomnia    OA (osteoarthritis)    Onychomycosis    Osteoarthritis    Parkinson disease (HCC) 03/23/2015   RLS (restless legs syndrome) 03/23/2015   Tenosynovitis    Past Surgical History:  Procedure Laterality Date   TUBAL LIGATION     Patient Active Problem List   Diagnosis Date Noted   Common peroneal neuropathy, left 04/27/2019   Upper airway cough syndrome 05/03/2015   RLS (restless legs syndrome) 03/23/2015   Parkinson disease (HCC) 03/23/2015   Fever 08/29/2011   Headache 08/29/2011   Knee pain, right 08/29/2011   Back pain 08/29/2011   Bacteremia due to Gram-positive bacteria 08/29/2011   Hepatitis 08/29/2011   Periorbital edema 08/29/2011   Facial rash 08/29/2011   Blurred vision, right eye 08/29/2011   Tremor 08/29/2011   Meningitis 08/29/2011   CHEST PAIN UNSPECIFIED 03/04/2007   PNEUMONIA, LEFT LOWER LOBE 03/03/2007   COUGH, CHRONIC 03/03/2007    PCP: Toribio Jerel MATSU, MD   REFERRING PROVIDER: Evonnie Asberry RAMAN, DO   REFERRING DIAG: G20.A1 (ICD-10-CM) - Parkinson's disease without dyskinesia or fluctuating manifestations (HCC)  THERAPY DIAG:  Other abnormalities of gait and mobility  Unsteadiness on feet  Muscle weakness (generalized)  RATIONALE FOR  EVALUATION AND TREATMENT: Rehabilitation  ONSET DATE: PD diagnosis in January 2017, worsening symptoms since April/May 2025  NEXT MD VISIT: 11/25/23   SUBJECTIVE:                                                                                                                                                                                                         SUBJECTIVE STATEMENT: Most recent PT episode for PD started in Aug 2024 at [facility]  in [place], [place] and completed in Dec 2024. As of April/May, she noted changes in her mobility - started taking baby steps which seems to come and go.  Feels like she is freezing at times when she walks with small shuffle steps due to cement feeling in her legs and feet.  Has increased difficulty getting in/out of bed.  Notes more anxiety lately and not sleeping well - could not get comfortable due to RLS.  Also notes her muscles feel like they want to draw up.  Prior to this exacerbation she was walking w/o the walker.  She stopped driving due to cement feeling in her legs.  Pt accompanied by: self  PAIN: Are you having pain? No and Yes: NPRS scale: 0/10 currently, up to 9/10 in intensity Pain location: L leg  Pain description: sore, agony, tight  Aggravating factors: unpredictable, fatigue (worn out from effort of walking)  Relieving factors: stretching   PERTINENT HISTORY:  L common peroneal neuropathy (2021), headache, OA, RLS, h/o back pain  PRECAUTIONS: None  RED FLAGS: None  WEIGHT BEARING RESTRICTIONS: No  FALLS:  Has patient fallen in last 6 months? No  LIVING ENVIRONMENT: Lives with: lives with their spouse or sister (when husband away for work) Lives in: [place] with husband, condo with sister Stairs: Yes: External: 1 steps; none - at her home Has following equipment at home: Single point cane, Environmental consultant - 4 wheeled, shower chair, and Grab bars  OCCUPATION: Retired  PLOF: Independent with household mobility with device,  Independent with community mobility with device, Needs assistance with homemaking, and Leisure: scrabble, 4-5 days/week to gym until ~1 month ago  PATIENT GOALS: To be able to walk w/o the cement feeling in my feet - a steady walk w/o shuffling.   OBJECTIVE: (objective measures completed at initial evaluation unless otherwise dated)  DIAGNOSTIC FINDINGS:  05/01/2022 - MRI cervical spine IMPRESSION:  1. Stable degenerative changes of the cervical spine with mild spinal canal stenosis at C2-3, C3-4 and C5-6.  2. Multilevel neural foraminal narrowing, severe on the right at C5-6 and moderate on the right at C3-4 and C7-T1.   COGNITION: Overall cognitive status: Within functional limits for tasks assessed   SENSATION: WFL Numbness in L great toe (intermittent)  COORDINATION: Gross motor - mild slowing  EDEMA:  Not recently  MUSCLE TONE: LLE: Mild into L ankle inversion  POSTURE:  rounded shoulders, forward head, flexed trunk , and L ankle drawing into inversion  MUSCLE LENGTH: Hamstrings: Mild tight B ITB: Mild tight L>R Piriformis: Mod tight B Hip flexors: Mod/severe tight B Quads: Mild/mod tight B Heelcord: Mild/mod tight B  LOWER EXTREMITY ROM:    Grossly WFL other than restrictions as noted above in muscle length  LOWER EXTREMITY MMT:    MMT Right eval Left eval  Hip flexion 4 4  Hip extension 3- 3-  Hip abduction 3 3  Hip adduction 4- 4-  Hip internal rotation 4 4  Hip external rotation 3+ 3+  Knee flexion 4+ 4  Knee extension 4+ 4+  Ankle dorsiflexion 4 4-  Ankle plantarflexion 5 4+   Ankle inversion 4- 3+  Ankle eversion 4 4-  (Blank rows = not tested)  BED MOBILITY:  Sit to supine SBA Supine to sit SBA Rolling to Right SBA Rolling to Left SBA  TRANSFERS: Assistive device utilized: Environmental consultant - 4 wheeled and None  Sit to stand: Modified independence Stand to sit: Modified independence Chair to chair: Modified independence Floor:  NT  GAIT: Distance walked: Clinic distances Assistive device utilized: Environmental consultant - 4 wheeled and None Level of assistance: Modified independence and SBA Gait pattern: step through pattern, decreased stride length, decreased hip/knee flexion- Right, decreased hip/knee flexion- Left, shuffling, and trunk flexed Comments: No freezing demonstrated during initial eval assessment however patient reports increasing episodes of freezing of gait recently  FUNCTIONAL TESTS:  5 times sit to stand: 10.79 sec w/o UE assist Timed up and go (TUG): 9.63 sec with 4WW; 10.50 sec w/o AD (normal), 10.06 sec (manual), 12.66 sec (cognitive - shifted from counting back by 3's to 2's mid test) 10 meter walk test: 14.00 sec with 4WW, 13.10 sec w/o AD Gait speed: 2.34 ft/sec with 4WW, 2.50 ft/sec w/o AD Functional gait assessment: TBA FGA Interpretation of scores: Non-Specific Older Adults Cutoff Score: <=22/30 = risk of falls Parkinson's Disease Cutoff score <15/30 = fall risk (Hoehn & Yahr 1-4) Minimally Clinically Important Difference (MCID)  Stroke (acute, subacute, and chronic) = MDC: 4.2 points Vestibular (acute) = MDC: 6 points Community Dwelling Older Adults =  MCID: 4 points Parkinson's Disease  =  MDC: 4.3 points  PATIENT SURVEYS:  ABC scale: 480 / 1600 = 30.0 %, <69% indicates risk for recurrent falls in PD and <50% indicates a low level of physical functioning  TODAY'S TREATMENT:   09/09/2023 - Eval SELF CARE:  Reviewed eval findings and role of PT in addressing identified deficits as well as need for further assessment of dynamic balance during gait.    PATIENT EDUCATION:  Education details: PT eval findings, anticipated POC, and need for further assessment of FGA vs DGI  Person educated: Patient Education method: Explanation Education comprehension: verbalized understanding  HOME EXERCISE PROGRAM: TBD   ASSESSMENT:  CLINICAL IMPRESSION: Makaya EMERSYNN DEATLEY is a 73 y.o. female who was  referred to physical therapy for evaluation and treatment for Parkinson's disease.  She was first diagnosed with Parkinson's in 2017.  She has completed 1 prior PT episode in 2017 within the [facility] system.  Her most recent PT episode for Parkinson's disease was Aug - Dec 2024 at [facility] in [place].  Since her last PT episode, she reports changes in her mobility with worsening of shuffling gait and freezing of gait, resulting in need for increased reliance on 4WW/rollator.  She also notes increased difficulty with bed mobility of late.  Patient presents with physical impairments of decreased timing and coordination of gait, impaired ambulation, impaired standing balance, abnormal posture, bradykinesia with transfers, impaired activity tolerance, LE weakness, postural instability and decreased safety awareness impacting safe and independent functional mobility.  Examination revealed patient is at risk for falls and functional decline as evidenced by the following objective test measures: 5xSTS of 10.79 sec (>15 sec indicates increased risk for falls and decreased BLE power), Gait speed of 2.34 ft/sec with 4WW and 2.50 ft/sec w/o AD (2.62 ft/sec is needed for safe community access), TUG of 9.63 sec with 4WW and 10.50 sec w/o AD (>13.5 sec indicates increased risk for falls), TUG Manual of 10.06 sec (difference between TUG manual and TUG >4.5 seconds indicates increased fall risk), and TUG cognitive of 12.66 sec (>/= 15 seconds indicates high risk for falls and community dwelling older adults).  TUG scores of >10% difference indicate difficulty with dual tasking.  Further testing of dynamic gait stability indicated and will be completed on next visit.  ABC scale score of 30% indicates a low level of physical functioning.  Kirbi will benefit  from skilled PT to address above deficits to improve mobility and activity tolerance to help reach the maximal level of functional independence with mobility and gait  with reduced risk for falls.  Patient demonstrates understanding of this POC and is in agreement with this plan.   OBJECTIVE IMPAIRMENTS: Abnormal gait, decreased activity tolerance, decreased balance, decreased coordination, decreased knowledge of condition, decreased knowledge of use of DME, decreased mobility, difficulty walking, decreased strength, impaired perceived functional ability, increased muscle spasms, impaired flexibility, improper body mechanics, postural dysfunction, and pain.   ACTIVITY LIMITATIONS: standing, sleeping, stairs, transfers, bed mobility, bathing, locomotion level, and caring for others  PARTICIPATION LIMITATIONS: meal prep, cleaning, laundry, driving, shopping, and community activity  PERSONAL FACTORS: Fitness, Past/current experiences, Time since onset of injury/illness/exacerbation, and 3+ comorbidities: L common peroneal neuropathy (2021), headache, OA, RLS, h/o back pain are also affecting patient's functional outcome.   REHAB POTENTIAL: Good  CLINICAL DECISION MAKING: Unstable/unpredictable  EVALUATION COMPLEXITY: High   GOALS: Goals reviewed with patient? Yes  SHORT TERM GOALS: Target date: 10/21/2023  Patient will be independent with initial HEP. Baseline:  Goal status: INITIAL  2.  Patient will demonstrate improvement in overall LE muscle strength by at least 1/2 grade on MMT. Baseline: Refer to above MMT table Goal status: INITIAL  3.  Patient will be educated on strategies to decrease risk of falls.  Baseline:  Goal status: INITIAL  4.  Patient will verbalize tips to reduce freezing/festination with gait and turns. Baseline:  Goal status: INITIAL  LONG TERM GOALS: Target date: 12/02/2023  Patient will be independent with ongoing/advanced HEP for self-management at home incorporating PWR! Moves as indicated .  Baseline:  Goal status: INITIAL  2.  Patient will be able to ambulate 600' with or w/o LRAD with good safety and without  freezing of gait to access community.  Baseline:  Goal status: INITIAL  3.  Patient will be able to step up/down curb safely with or w/oLRAD for safety with community ambulation.  Baseline:  Goal status: INITIAL   4.  Patient will demonstrate gait speed of >/= 2.62 ft/sec to be a safe limited community ambulator with decreased risk for recurrent falls.  Baseline: 2.34 ft/sec with 4WW, 2.50 ft/sec w/o AD Goal status: INITIAL  5.  Patient will demonstrate at least a 4 point improvement on FGA to improve gait stability and reduce risk for falls. (MCID = 4 points) Baseline: TBA Goal status: INITIAL  6.  Patient will report >/= 43% on ABC scale to demonstrate improved balance confidence and decreased risk for falls. Baseline: 480 / 1600 = 30.0 % Goal status: INITIAL  7. Patient will verbalize understanding of local Parkinson's disease community resources, including community fitness post d/c. Baseline:  Goal status: INITIAL   PLAN:  PT FREQUENCY: 2x/week  PT DURATION: 12 weeks  PLANNED INTERVENTIONS: 97164- PT Re-evaluation, 97750- Physical Performance Testing, 97110-Therapeutic exercises, 97530- Therapeutic activity, [id number]- Neuromuscular re-education, 97535- Self Care, [id number]- Manual therapy, [phone]- Gait training, [phone]- Electrical stimulation (unattended), [phone]- Electrical stimulation (manual), [id number]- Ultrasound, [id number]- Ionotophoresis 4mg /ml Dexamethasone , [id number] (1-2 muscles), [id number] (3+ muscles)- Dry Needling, Patient/Family education, Balance training, Stair training, Taping, Joint mobilization, DME instructions, Cryotherapy, and Moist heat  PLAN FOR NEXT SESSION: Complete FGA; create initial HEP including LE flexibility/stretching as well as core/LE strengthening; provide education on fall risk prevention; instruct in tips to reduce freezing of mobility and gait   Elijah CHRISTELLA Hidden, PT 09/09/2023, 6:15 PM   Date of referral: 08/18/23 Referring provider:  Evonnie Asberry RAMAN,  DO Referring diagnosis? G20.A1 (ICD-10-CM) - Parkinson's disease without dyskinesia or fluctuating manifestations (HCC) Treatment diagnosis? (if different than referring diagnosis)  Other abnormalities of gait and mobility  Unsteadiness on feet  Muscle weakness (generalized)  What was this (referring dx) caused by? Ongoing Issue  Lysle of Condition: Chronic (continuous duration > 3 months)   Laterality: Both  Current Functional Measure Score: Other ABC scale: 480 / 1600 = 30.0 %  Objective measurements identify impairments when they are compared to normal values, the uninvolved extremity, and prior level of function.  [x]  Yes  []  No  Objective assessment of functional ability: Moderate functional limitations   Briefly describe symptoms: Teighan reports changes in her mobility with worsening of shuffling gait and freezing of gait since April/May, resulting in need for increased reliance on 4WW/rollator.  She also notes increased difficulty with bed mobility of late.  She presents with physical impairments of decreased timing and coordination of gait, impaired ambulation, impaired standing balance, abnormal posture, bradykinesia with transfers, impaired activity tolerance, LE weakness, postural instability and decreased safety awareness impacting safe and independent functional mobility.  Gait speeds of 2.34 ft/sec with 4WW and 2.50 ft/sec w/o AD indicate limited community ambulator (2.62 ft/sec is needed for safe community access).  Further testing of dynamic gait stability indicated and will be completed on next visit.  ABC scale score of 30% indicates a low level of physical functioning  How did symptoms start: PT first diagnosed in 2017 with current exacerbation of symptoms beginning in April/May of this year with increasing difficulty with mobility noted, in particular getting in and out of bed and increased shuffling and freezing of gait.  Average pain intensity:  Last 24 hours:  0/10  Past week: up to 9/10  How often does the pt experience symptoms? Frequently  How much have the symptoms interfered with usual daily activities? Quite a bit  How has condition changed since care began at this facility? NA - initial visit  In general, how is the patients overall health? Good  Onset date: PT first diagnosed in 2017 with current exacerbation of symptoms beginning in April/May 2025   BACK PAIN (STarT Back Screening Tool) - (When applicable): N/A  Has your back pain spread down your leg(s) at sometime in the last 2 weeks? []  Yes   []  No Have you had pain in the shoulder or neck at sometime in the past 2 weeks? []  Yes   []  No Have you only walked short distances because of your back pain? []  Yes   []  No In the past 2 weeks, have you dressed more slowly than usual because of your back pain? []  Yes   []  No Do you think it is not really safe for person with a condition like yours to be physically active? []  Yes   []  No Have worrying thoughts been going through your mind a lot of the time? []  Yes   []  No Do you feel that your back pain is terrible and it is never going to get any better? []  Yes   []  No In general, have you stopped enjoying all the things you usually enjoy? []  Yes   []  No Overall, how bothersome has your back pain been in the last 2 weeks? []  Not at all   []  Slightly     []  Moderate   []  Very much     []  Extremely

## 2023-09-09 ENCOUNTER — Encounter: Payer: Self-pay | Admitting: Physical Therapy

## 2023-09-09 ENCOUNTER — Ambulatory Visit: Attending: Neurology | Admitting: Physical Therapy

## 2023-09-09 ENCOUNTER — Other Ambulatory Visit: Payer: Self-pay

## 2023-09-09 DIAGNOSIS — G20A1 Parkinson's disease without dyskinesia, without mention of fluctuations: Secondary | ICD-10-CM | POA: Insufficient documentation

## 2023-09-09 DIAGNOSIS — R5383 Other fatigue: Secondary | ICD-10-CM | POA: Diagnosis not present

## 2023-09-09 DIAGNOSIS — E7800 Pure hypercholesterolemia, unspecified: Secondary | ICD-10-CM | POA: Diagnosis not present

## 2023-09-09 DIAGNOSIS — M6281 Muscle weakness (generalized): Secondary | ICD-10-CM | POA: Diagnosis not present

## 2023-09-09 DIAGNOSIS — R2689 Other abnormalities of gait and mobility: Secondary | ICD-10-CM | POA: Insufficient documentation

## 2023-09-09 DIAGNOSIS — R2681 Unsteadiness on feet: Secondary | ICD-10-CM | POA: Diagnosis not present

## 2023-09-09 DIAGNOSIS — E7849 Other hyperlipidemia: Secondary | ICD-10-CM | POA: Diagnosis not present

## 2023-09-09 DIAGNOSIS — G2581 Restless legs syndrome: Secondary | ICD-10-CM | POA: Diagnosis not present

## 2023-09-09 DIAGNOSIS — Z131 Encounter for screening for diabetes mellitus: Secondary | ICD-10-CM | POA: Diagnosis not present

## 2023-09-09 DIAGNOSIS — I10 Essential (primary) hypertension: Secondary | ICD-10-CM | POA: Diagnosis not present

## 2023-09-16 ENCOUNTER — Ambulatory Visit

## 2023-09-16 DIAGNOSIS — M6281 Muscle weakness (generalized): Secondary | ICD-10-CM | POA: Diagnosis not present

## 2023-09-16 DIAGNOSIS — G2581 Restless legs syndrome: Secondary | ICD-10-CM | POA: Diagnosis not present

## 2023-09-16 DIAGNOSIS — K2100 Gastro-esophageal reflux disease with esophagitis, without bleeding: Secondary | ICD-10-CM | POA: Diagnosis not present

## 2023-09-16 DIAGNOSIS — I10 Essential (primary) hypertension: Secondary | ICD-10-CM | POA: Diagnosis not present

## 2023-09-16 DIAGNOSIS — M1990 Unspecified osteoarthritis, unspecified site: Secondary | ICD-10-CM | POA: Diagnosis not present

## 2023-09-16 DIAGNOSIS — E782 Mixed hyperlipidemia: Secondary | ICD-10-CM | POA: Diagnosis not present

## 2023-09-16 DIAGNOSIS — R2681 Unsteadiness on feet: Secondary | ICD-10-CM | POA: Diagnosis not present

## 2023-09-16 DIAGNOSIS — R2689 Other abnormalities of gait and mobility: Secondary | ICD-10-CM

## 2023-09-16 DIAGNOSIS — G20A1 Parkinson's disease without dyskinesia, without mention of fluctuations: Secondary | ICD-10-CM | POA: Diagnosis not present

## 2023-09-16 DIAGNOSIS — E7849 Other hyperlipidemia: Secondary | ICD-10-CM | POA: Diagnosis not present

## 2023-09-16 DIAGNOSIS — Z6824 Body mass index (BMI) 24.0-24.9, adult: Secondary | ICD-10-CM | POA: Diagnosis not present

## 2023-09-16 NOTE — Therapy (Signed)
 OUTPATIENT PHYSICAL THERAPY PARKINSON'S TREATMENT   Patient Name: [name] [name] MRN: [id number] DOB:03-31-1950, 73 y.o., female Today's Date: 09/16/2023   END OF SESSION:  PT End of Session - 09/16/23 1319     Visit Number 2    Date for PT Re-Evaluation 12/02/23    Authorization Type [employer]    Authorization Time Period 09/09/23-12/02/23    Authorization - Visit Number 2    Authorization - Number of Visits 6    PT Start Time 1315    PT Stop Time 1413    PT Time Calculation (min) 58 min    Activity Tolerance Patient tolerated treatment well    Behavior During Therapy [facility] for tasks assessed/performed           Past Medical History:  Diagnosis Date   Bursitis    Cellulitis    arm   Common peroneal neuropathy, left 04/27/2019   Headache    Hyperlipidemia    Insomnia    OA (osteoarthritis)    Onychomycosis    Osteoarthritis    Parkinson disease (HCC) 03/23/2015   RLS (restless legs syndrome) 03/23/2015   Tenosynovitis    Past Surgical History:  Procedure Laterality Date   TUBAL LIGATION     Patient Active Problem List   Diagnosis Date Noted   Common peroneal neuropathy, left 04/27/2019   Upper airway cough syndrome 05/03/2015   RLS (restless legs syndrome) 03/23/2015   Parkinson disease (HCC) 03/23/2015   Fever 08/29/2011   Headache 08/29/2011   Knee pain, right 08/29/2011   Back pain 08/29/2011   Bacteremia due to Gram-positive bacteria 08/29/2011   Hepatitis 08/29/2011   Periorbital edema 08/29/2011   Facial rash 08/29/2011   Blurred vision, right eye 08/29/2011   Tremor 08/29/2011   Meningitis 08/29/2011   CHEST PAIN UNSPECIFIED 03/04/2007   PNEUMONIA, LEFT LOWER LOBE 03/03/2007   COUGH, CHRONIC 03/03/2007    PCP: Toribio Jerel MATSU, MD   REFERRING PROVIDER: Evonnie Asberry RAMAN, DO   REFERRING DIAG: G20.A1 (ICD-10-CM) - Parkinson's disease without dyskinesia or fluctuating manifestations (HCC)  THERAPY DIAG:  Other abnormalities of gait and  mobility  Unsteadiness on feet  Muscle weakness (generalized)  RATIONALE FOR EVALUATION AND TREATMENT: Rehabilitation  ONSET DATE: PD diagnosis in January 2017, worsening symptoms since April/May 2025  NEXT MD VISIT: 11/25/23   SUBJECTIVE:  SUBJECTIVE STATEMENT: Pt reports no recent falls, no pain.  Pt accompanied by: self  PAIN: Are you having pain? No and Yes: NPRS scale: 0/10 currently, up to 9/10 in intensity Pain location: L leg  Pain description: sore, agony, tight  Aggravating factors: unpredictable, fatigue (worn out from effort of walking)  Relieving factors: stretching   PERTINENT HISTORY:  L common peroneal neuropathy (2021), headache, OA, RLS, h/o back pain  PRECAUTIONS: None  RED FLAGS: None  WEIGHT BEARING RESTRICTIONS: No  FALLS:  Has patient fallen in last 6 months? No  LIVING ENVIRONMENT: Lives with: lives with their spouse or sister (when husband away for work) Lives in: [place] with husband, condo with sister Stairs: Yes: External: 1 steps; none - at her home Has following equipment at home: Single point cane, Environmental consultant - 4 wheeled, shower chair, and Grab bars  OCCUPATION: Retired  PLOF: Independent with household mobility with device, Independent with community mobility with device, Needs assistance with homemaking, and Leisure: scrabble, 4-5 days/week to gym until ~1 month ago  PATIENT GOALS: To be able to walk w/o the cement feeling in my feet - a steady walk w/o shuffling.   OBJECTIVE: (objective measures completed at initial evaluation unless otherwise dated)  DIAGNOSTIC FINDINGS:  05/01/2022 - MRI cervical spine IMPRESSION:  1. Stable degenerative changes of the cervical spine with mild spinal canal stenosis at C2-3, C3-4 and C5-6.  2.  Multilevel neural foraminal narrowing, severe on the right at C5-6 and moderate on the right at C3-4 and C7-T1.   COGNITION: Overall cognitive status: Within functional limits for tasks assessed   SENSATION: WFL Numbness in L great toe (intermittent)  COORDINATION: Gross motor - mild slowing  EDEMA:  Not recently  MUSCLE TONE: LLE: Mild into L ankle inversion  POSTURE:  rounded shoulders, forward head, flexed trunk , and L ankle drawing into inversion  MUSCLE LENGTH: Hamstrings: Mild tight B ITB: Mild tight L>R Piriformis: Mod tight B Hip flexors: Mod/severe tight B Quads: Mild/mod tight B Heelcord: Mild/mod tight B  LOWER EXTREMITY ROM:    Grossly WFL other than restrictions as noted above in muscle length  LOWER EXTREMITY MMT:    MMT Right eval Left eval  Hip flexion 4 4  Hip extension 3- 3-  Hip abduction 3 3  Hip adduction 4- 4-  Hip internal rotation 4 4  Hip external rotation 3+ 3+  Knee flexion 4+ 4  Knee extension 4+ 4+  Ankle dorsiflexion 4 4-  Ankle plantarflexion 5 4+   Ankle inversion 4- 3+  Ankle eversion 4 4-  (Blank rows = not tested)  BED MOBILITY:  Sit to supine SBA Supine to sit SBA Rolling to Right SBA Rolling to Left SBA  TRANSFERS: Assistive device utilized: Environmental consultant - 4 wheeled and None  Sit to stand: Modified independence Stand to sit: Modified independence Chair to chair: Modified independence Floor: NT  GAIT: Distance walked: Clinic distances Assistive device utilized: Environmental consultant - 4 wheeled and None Level of assistance: Modified independence and SBA Gait pattern: step through pattern, decreased stride length, decreased hip/knee flexion- Right, decreased hip/knee flexion- Left, shuffling, and trunk flexed Comments: No freezing demonstrated during initial eval assessment however patient reports increasing episodes of freezing of gait recently  FUNCTIONAL TESTS:  5 times sit to stand: 10.79 sec w/o UE assist Timed up and go  (TUG): 9.63 sec with 4WW; 10.50 sec w/o AD (normal), 10.06 sec (manual), 12.66 sec (cognitive - shifted from counting back by 3's  to 2's mid test) 10 meter walk test: 14.00 sec with 4WW, 13.10 sec w/o AD Gait speed: 2.34 ft/sec with 4WW, 2.50 ft/sec w/o AD Functional gait assessment:   [facility] PT Assessment - 09/16/23 0001       Functional Gait  Assessment   Gait assessed  Yes    Gait Level Surface Walks 20 ft, slow speed, abnormal gait pattern, evidence for imbalance or deviates 10-15 in outside of the 12 in walkway width. Requires more than 7 sec to ambulate 20 ft.    Change in Gait Speed Able to change speed, demonstrates mild gait deviations, deviates 6-10 in outside of the 12 in walkway width, or no gait deviations, unable to achieve a major change in velocity, or uses a change in velocity, or uses an assistive device.    Gait with Horizontal Head Turns Performs head turns smoothly with slight change in gait velocity (eg, minor disruption to smooth gait path), deviates 6-10 in outside 12 in walkway width, or uses an assistive device.    Gait with Vertical Head Turns Performs task with slight change in gait velocity (eg, minor disruption to smooth gait path), deviates 6 - 10 in outside 12 in walkway width or uses assistive device    Gait and Pivot Turn Pivot turns safely within 3 sec and stops quickly with no loss of balance.    Step Over Obstacle Is able to step over one shoe box (4.5 in total height) without changing gait speed. No evidence of imbalance.    Gait with Narrow Base of Support Ambulates 7-9 steps.    Gait with Eyes Closed Walks 20 ft, uses assistive device, slower speed, mild gait deviations, deviates 6-10 in outside 12 in walkway width. Ambulates 20 ft in less than 9 sec but greater than 7 sec.    Ambulating Backwards Walks 20 ft, slow speed, abnormal gait pattern, evidence for imbalance, deviates 10-15 in outside 12 in walkway width.    Steps Alternating feet, must use rail.     Total Score 19         FGA Interpretation of scores: Non-Specific Older Adults Cutoff Score: <=22/30 = risk of falls Parkinson's Disease Cutoff score <15/30 = fall risk (Hoehn & Yahr 1-4) Minimally Clinically Important Difference (MCID)  Stroke (acute, subacute, and chronic) = MDC: 4.2 points Vestibular (acute) = MDC: 6 points Community Dwelling Older Adults =  MCID: 4 points Parkinson's Disease  =  MDC: 4.3 points  PATIENT SURVEYS:  ABC scale: 480 / 1600 = 30.0 %, <69% indicates risk for recurrent falls in PD and <50% indicates a low level of physical functioning  TODAY'S TREATMENT:  09/16/23 Nustep L3x38min UE/LE FGA assessed Seated ball squeeze 10x3 Seated hip ABD GTB 10x3 Seated marching GTB 2 x 10 BLE Standing hip abduction x 10  Standing hip extension x 10 Standing marching x 10 Seated KTOS and figure 4 stretch x 30' Seated lunge hip flexor stretch BLE  09/09/2023 - Eval SELF CARE:  Reviewed eval findings and role of PT in addressing identified deficits as well as need for further assessment of dynamic balance during gait.    PATIENT EDUCATION:  Education details: PT eval findings, anticipated POC, and need for further assessment of FGA vs DGI  Person educated: Patient Education method: Explanation Education comprehension: verbalized understanding  HOME EXERCISE PROGRAM: Access Code: [email or web address] URL: [email or web address] Date: 09/16/2023 Prepared by: Mercie Balsley  Exercises - Seated Piriformis Stretch  - 2 x daily - 7 x weekly -  2 sets - 2 reps - 30 sec hold - Seated Piriformis Stretch  - 2 x daily - 7 x weekly - 2 sets - 2 reps - 30 sec hold - Seated Hip Flexor Stretch  - 2 x daily - 7 x weekly - 2 sets - 2 reps - 30 sec hold - Standing Hip Abduction with Counter Support  - 1 x daily - 7 x weekly - 2 sets - 10 reps - Standing Hip Extension with Counter Support  - 1 x daily - 7 x weekly - 2 sets - 10 reps - Standing March with Counter Support  - 1  x daily - 7 x weekly - 2 sets - 10 reps   ASSESSMENT:  CLINICAL IMPRESSION: Pt tolerated session well. She scored 19/30 on FGA which does show a fall risk. She was able to complete bulk of test w/o her AD. She does tend to catch L foot on floor more than R, as well as showing a shuffling gait at times. Started HEP for hip strengthening and flexibility. Pt demonstrated more tightness in L piriformis and increased LLE weakness as well. Spent a good amount of time providing education on ways to do HEP at home.  Eval: [name] [name] is a 73 y.o. female who was referred to physical therapy for evaluation and treatment for Parkinson's disease.  She was first diagnosed with Parkinson's in 2017.  She has completed 1 prior PT episode in 2017 within the [facility] system.  Her most recent PT episode for Parkinson's disease was Aug - Dec 2024 at [facility] in [place].  Since her last PT episode, she reports changes in her mobility with worsening of shuffling gait and freezing of gait, resulting in need for increased reliance on 4WW/rollator.  She also notes increased difficulty with bed mobility of late.  Patient presents with physical impairments of decreased timing and coordination of gait, impaired ambulation, impaired standing balance, abnormal posture, bradykinesia with transfers, impaired activity tolerance, LE weakness, postural instability and decreased safety awareness impacting safe and independent functional mobility.  Examination revealed patient is at risk for falls and functional decline as evidenced by the following objective test measures: 5xSTS of 10.79 sec (>15 sec indicates increased risk for falls and decreased BLE power), Gait speed of 2.34 ft/sec with 4WW and 2.50 ft/sec w/o AD (2.62 ft/sec is needed for safe community access), TUG of 9.63 sec with 4WW and 10.50 sec w/o AD (>13.5 sec indicates increased risk for falls), TUG Manual of 10.06 sec (difference between TUG manual and TUG >4.5  seconds indicates increased fall risk), and TUG cognitive of 12.66 sec (>/= 15 seconds indicates high risk for falls and community dwelling older adults).  TUG scores of >10% difference indicate difficulty with dual tasking.  Further testing of dynamic gait stability indicated and will be completed on next visit.  ABC scale score of 30% indicates a low level of physical functioning.  [name] [name] will benefit from skilled PT to address above deficits to improve mobility and activity tolerance to help reach the maximal level of functional independence with mobility and gait with reduced risk for falls.  Patient demonstrates understanding of this POC and is in agreement with this plan.   OBJECTIVE IMPAIRMENTS: Abnormal gait, decreased activity tolerance, decreased balance, decreased coordination, decreased knowledge of condition, decreased knowledge of use of DME, decreased mobility, difficulty walking, decreased strength, impaired perceived functional ability, increased muscle spasms, impaired flexibility, improper body mechanics, postural dysfunction, and pain.  ACTIVITY LIMITATIONS: standing, sleeping, stairs, transfers, bed mobility, bathing, locomotion level, and caring for others  PARTICIPATION LIMITATIONS: meal prep, cleaning, laundry, driving, shopping, and community activity  PERSONAL FACTORS: Fitness, Past/current experiences, Time since onset of injury/illness/exacerbation, and 3+ comorbidities: L common peroneal neuropathy (2021), headache, OA, RLS, h/o back pain are also affecting patient's functional outcome.   REHAB POTENTIAL: Good  CLINICAL DECISION MAKING: Unstable/unpredictable  EVALUATION COMPLEXITY: High   GOALS: Goals reviewed with patient? Yes  SHORT TERM GOALS: Target date: 10/21/2023  Patient will be independent with initial HEP. Baseline:  Goal status: IN PROGRESS  2.  Patient will demonstrate improvement in overall LE muscle strength by at least 1/2 grade on  MMT. Baseline: Refer to above MMT table Goal status: IN PROGRESS  3.  Patient will be educated on strategies to decrease risk of falls.  Baseline:  Goal status: IN PROGRESS  4.  Patient will verbalize tips to reduce freezing/festination with gait and turns. Baseline:  Goal status: IN PROGRESS  LONG TERM GOALS: Target date: 12/02/2023  Patient will be independent with ongoing/advanced HEP for self-management at home incorporating PWR! Moves as indicated .  Baseline:  Goal status: IN PROGRESS  2.  Patient will be able to ambulate 600' with or w/o LRAD with good safety and without freezing of gait to access community.  Baseline:  Goal status: IN PROGRESS  3.  Patient will be able to step up/down curb safely with or w/oLRAD for safety with community ambulation.  Baseline:  Goal status: IN PROGRESS   4.  Patient will demonstrate gait speed of >/= 2.62 ft/sec to be a safe limited community ambulator with decreased risk for recurrent falls.  Baseline: 2.34 ft/sec with 4WW, 2.50 ft/sec w/o AD Goal status: IN PROGRESS  5.  Patient will demonstrate at least a 4 point improvement on FGA to improve gait stability and reduce risk for falls. (MCID = 4 points) Baseline: TBA Goal status: IN PROGRESS  6.  Patient will report >/= 43% on ABC scale to demonstrate improved balance confidence and decreased risk for falls. Baseline: 480 / 1600 = 30.0 % Goal status: IN PROGRESS  7. Patient will verbalize understanding of local Parkinson's disease community resources, including community fitness post d/c. Baseline:  Goal status: IN PROGRESS   PLAN:  PT FREQUENCY: 2x/week  PT DURATION: 12 weeks  PLANNED INTERVENTIONS: 97164- PT Re-evaluation, 97750- Physical Performance Testing, 97110-Therapeutic exercises, 97530- Therapeutic activity, 97112- Neuromuscular re-education, 97535- Self Care, [id number]- Manual therapy, [phone]- Gait training, [phone]- Electrical stimulation (unattended), [phone]- Electrical  stimulation (manual), [id number]- Ultrasound, [id number]- Ionotophoresis 4mg /ml Dexamethasone , 79439 (1-2 muscles), 20561 (3+ muscles)- Dry Needling, Patient/Family education, Balance training, Stair training, Taping, Joint mobilization, DME instructions, Cryotherapy, and Moist heat  PLAN FOR NEXT SESSION: Review HEP and progress; work on improving foot clearance; provide education on fall risk prevention; instruct in tips to reduce freezing of mobility and gait   Sol LITTIE Gaskins, PTA 09/16/2023, 2:27 PM   Date of referral: 08/18/23 Referring provider: Evonnie Asberry RAMAN, DO Referring diagnosis? G20.A1 (ICD-10-CM) - Parkinson's disease without dyskinesia or fluctuating manifestations (HCC) Treatment diagnosis? (if different than referring diagnosis)  Other abnormalities of gait and mobility  Unsteadiness on feet  Muscle weakness (generalized)  What was this (referring dx) caused by? Ongoing Issue  Lysle of Condition: Chronic (continuous duration > 3 months)   Laterality: Both  Current Functional Measure Score: Other ABC scale: 480 / 1600 = 30.0 %  Objective measurements identify impairments when they are  compared to normal values, the uninvolved extremity, and prior level of function.  [x]  Yes  []  No  Objective assessment of functional ability: Moderate functional limitations   Briefly describe symptoms: [name] [name] reports changes in her mobility with worsening of shuffling gait and freezing of gait since April/May, resulting in need for increased reliance on 4WW/rollator.  She also notes increased difficulty with bed mobility of late.  She presents with physical impairments of decreased timing and coordination of gait, impaired ambulation, impaired standing balance, abnormal posture, bradykinesia with transfers, impaired activity tolerance, LE weakness, postural instability and decreased safety awareness impacting safe and independent functional mobility.  Gait speeds of 2.34 ft/sec with 4WW and 2.50  ft/sec w/o AD indicate limited community ambulator (2.62 ft/sec is needed for safe community access).  Further testing of dynamic gait stability indicated and will be completed on next visit.  ABC scale score of 30% indicates a low level of physical functioning  How did symptoms start: PT first diagnosed in 2017 with current exacerbation of symptoms beginning in April/May of this year with increasing difficulty with mobility noted, in particular getting in and out of bed and increased shuffling and freezing of gait.  Average pain intensity:  Last 24 hours: 0/10  Past week: up to 9/10  How often does the pt experience symptoms? Frequently  How much have the symptoms interfered with usual daily activities? Quite a bit  How has condition changed since care began at this facility? NA - initial visit  In general, how is the patients overall health? Good  Onset date: PT first diagnosed in 2017 with current exacerbation of symptoms beginning in April/May 2025   BACK PAIN (STarT Back Screening Tool) - (When applicable): N/A  Has your back pain spread down your leg(s) at sometime in the last 2 weeks? []  Yes   []  No Have you had pain in the shoulder or neck at sometime in the past 2 weeks? []  Yes   []  No Have you only walked short distances because of your back pain? []  Yes   []  No In the past 2 weeks, have you dressed more slowly than usual because of your back pain? []  Yes   []  No Do you think it is not really safe for person with a condition like yours to be physically active? []  Yes   []  No Have worrying thoughts been going through your mind a lot of the time? []  Yes   []  No Do you feel that your back pain is terrible and it is never going to get any better? []  Yes   []  No In general, have you stopped enjoying all the things you usually enjoy? []  Yes   []  No Overall, how bothersome has your back pain been in the last 2 weeks? []  Not at all   []  Slightly     []  Moderate   []  Very much      []  Extremely

## 2023-09-19 ENCOUNTER — Ambulatory Visit

## 2023-09-19 DIAGNOSIS — R2681 Unsteadiness on feet: Secondary | ICD-10-CM | POA: Diagnosis not present

## 2023-09-19 DIAGNOSIS — R2689 Other abnormalities of gait and mobility: Secondary | ICD-10-CM

## 2023-09-19 DIAGNOSIS — M6281 Muscle weakness (generalized): Secondary | ICD-10-CM | POA: Diagnosis not present

## 2023-09-19 DIAGNOSIS — G2581 Restless legs syndrome: Secondary | ICD-10-CM | POA: Diagnosis not present

## 2023-09-19 DIAGNOSIS — G20A1 Parkinson's disease without dyskinesia, without mention of fluctuations: Secondary | ICD-10-CM | POA: Diagnosis not present

## 2023-09-19 NOTE — Therapy (Signed)
 OUTPATIENT PHYSICAL THERAPY PARKINSON'S TREATMENT   Patient Name: [name] [name] MRN: [id number] DOB:1950-12-19, 73 y.o., female Today's Date: 09/19/2023   END OF SESSION:  PT End of Session - 09/19/23 1126     Visit Number 3    Date for PT Re-Evaluation 12/02/23    Authorization Type [employer]    Authorization Time Period 09/09/23-12/02/23    Authorization - Visit Number 3    Authorization - Number of Visits 6    PT Start Time 1018    PT Stop Time 1108    PT Time Calculation (min) 50 min    Equipment Utilized During Treatment Gait belt    Activity Tolerance Patient tolerated treatment well    Behavior During Therapy WFL for tasks assessed/performed            Past Medical History:  Diagnosis Date   Bursitis    Cellulitis    arm   Common peroneal neuropathy, left 04/27/2019   Headache    Hyperlipidemia    Insomnia    OA (osteoarthritis)    Onychomycosis    Osteoarthritis    Parkinson disease (HCC) 03/23/2015   RLS (restless legs syndrome) 03/23/2015   Tenosynovitis    Past Surgical History:  Procedure Laterality Date   TUBAL LIGATION     Patient Active Problem List   Diagnosis Date Noted   Common peroneal neuropathy, left 04/27/2019   Upper airway cough syndrome 05/03/2015   RLS (restless legs syndrome) 03/23/2015   Parkinson disease (HCC) 03/23/2015   Fever 08/29/2011   Headache 08/29/2011   Knee pain, right 08/29/2011   Back pain 08/29/2011   Bacteremia due to Gram-positive bacteria 08/29/2011   Hepatitis 08/29/2011   Periorbital edema 08/29/2011   Facial rash 08/29/2011   Blurred vision, right eye 08/29/2011   Tremor 08/29/2011   Meningitis 08/29/2011   CHEST PAIN UNSPECIFIED 03/04/2007   PNEUMONIA, LEFT LOWER LOBE 03/03/2007   COUGH, CHRONIC 03/03/2007    PCP: Toribio Jerel MATSU, MD   REFERRING PROVIDER: Evonnie Asberry RAMAN, DO   REFERRING DIAG: G20.A1 (ICD-10-CM) - Parkinson's disease without dyskinesia or fluctuating manifestations  (HCC)  THERAPY DIAG:  Other abnormalities of gait and mobility  Unsteadiness on feet  Muscle weakness (generalized)  RATIONALE FOR EVALUATION AND TREATMENT: Rehabilitation  ONSET DATE: PD diagnosis in January 2017, worsening symptoms since April/May 2025  NEXT MD VISIT: 11/25/23   SUBJECTIVE:  SUBJECTIVE STATEMENT: Pt reports no recent falls, no pain. Having some issues with lunge stretch in chair.  Pt accompanied by: self  PAIN: Are you having pain? No and Yes: NPRS scale: 0/10 currently, up to 9/10 in intensity Pain location: L leg  Pain description: sore, agony, tight  Aggravating factors: unpredictable, fatigue (worn out from effort of walking)  Relieving factors: stretching   PERTINENT HISTORY:  L common peroneal neuropathy (2021), headache, OA, RLS, h/o back pain  PRECAUTIONS: None  RED FLAGS: None  WEIGHT BEARING RESTRICTIONS: No  FALLS:  Has patient fallen in last 6 months? No  LIVING ENVIRONMENT: Lives with: lives with their spouse or sister (when husband away for work) Lives in: [place] with husband, condo with sister Stairs: Yes: External: 1 steps; none - at her home Has following equipment at home: Single point cane, Environmental consultant - 4 wheeled, shower chair, and Grab bars  OCCUPATION: Retired  PLOF: Independent with household mobility with device, Independent with community mobility with device, Needs assistance with homemaking, and Leisure: scrabble, 4-5 days/week to gym until ~1 month ago  PATIENT GOALS: To be able to walk w/o the cement feeling in my feet - a steady walk w/o shuffling.   OBJECTIVE: (objective measures completed at initial evaluation unless otherwise dated)  DIAGNOSTIC FINDINGS:  05/01/2022 - MRI cervical spine IMPRESSION:  1. Stable  degenerative changes of the cervical spine with mild spinal canal stenosis at C2-3, C3-4 and C5-6.  2. Multilevel neural foraminal narrowing, severe on the right at C5-6 and moderate on the right at C3-4 and C7-T1.   COGNITION: Overall cognitive status: Within functional limits for tasks assessed   SENSATION: WFL Numbness in L great toe (intermittent)  COORDINATION: Gross motor - mild slowing  EDEMA:  Not recently  MUSCLE TONE: LLE: Mild into L ankle inversion  POSTURE:  rounded shoulders, forward head, flexed trunk , and L ankle drawing into inversion  MUSCLE LENGTH: Hamstrings: Mild tight B ITB: Mild tight L>R Piriformis: Mod tight B Hip flexors: Mod/severe tight B Quads: Mild/mod tight B Heelcord: Mild/mod tight B  LOWER EXTREMITY ROM:    Grossly WFL other than restrictions as noted above in muscle length  LOWER EXTREMITY MMT:    MMT Right eval Left eval  Hip flexion 4 4  Hip extension 3- 3-  Hip abduction 3 3  Hip adduction 4- 4-  Hip internal rotation 4 4  Hip external rotation 3+ 3+  Knee flexion 4+ 4  Knee extension 4+ 4+  Ankle dorsiflexion 4 4-  Ankle plantarflexion 5 4+   Ankle inversion 4- 3+  Ankle eversion 4 4-  (Blank rows = not tested)  BED MOBILITY:  Sit to supine SBA Supine to sit SBA Rolling to Right SBA Rolling to Left SBA  TRANSFERS: Assistive device utilized: Environmental consultant - 4 wheeled and None  Sit to stand: Modified independence Stand to sit: Modified independence Chair to chair: Modified independence Floor: NT  GAIT: Distance walked: Clinic distances Assistive device utilized: Environmental consultant - 4 wheeled and None Level of assistance: Modified independence and SBA Gait pattern: step through pattern, decreased stride length, decreased hip/knee flexion- Right, decreased hip/knee flexion- Left, shuffling, and trunk flexed Comments: No freezing demonstrated during initial eval assessment however patient reports increasing episodes of freezing of  gait recently  FUNCTIONAL TESTS:  5 times sit to stand: 10.79 sec w/o UE assist Timed up and go (TUG): 9.63 sec with 4WW; 10.50 sec w/o AD (normal), 10.06 sec (manual), 12.66 sec (  cognitive - shifted from counting back by 3's to 2's mid test) 10 meter walk test: 14.00 sec with 4WW, 13.10 sec w/o AD Gait speed: 2.34 ft/sec with 4WW, 2.50 ft/sec w/o AD Functional gait assessment:   [facility] PT Assessment - 09/16/23 0001       Functional Gait  Assessment   Gait assessed  Yes    Gait Level Surface Walks 20 ft, slow speed, abnormal gait pattern, evidence for imbalance or deviates 10-15 in outside of the 12 in walkway width. Requires more than 7 sec to ambulate 20 ft.    Change in Gait Speed Able to change speed, demonstrates mild gait deviations, deviates 6-10 in outside of the 12 in walkway width, or no gait deviations, unable to achieve a major change in velocity, or uses a change in velocity, or uses an assistive device.    Gait with Horizontal Head Turns Performs head turns smoothly with slight change in gait velocity (eg, minor disruption to smooth gait path), deviates 6-10 in outside 12 in walkway width, or uses an assistive device.    Gait with Vertical Head Turns Performs task with slight change in gait velocity (eg, minor disruption to smooth gait path), deviates 6 - 10 in outside 12 in walkway width or uses assistive device    Gait and Pivot Turn Pivot turns safely within 3 sec and stops quickly with no loss of balance.    Step Over Obstacle Is able to step over one shoe box (4.5 in total height) without changing gait speed. No evidence of imbalance.    Gait with Narrow Base of Support Ambulates 7-9 steps.    Gait with Eyes Closed Walks 20 ft, uses assistive device, slower speed, mild gait deviations, deviates 6-10 in outside 12 in walkway width. Ambulates 20 ft in less than 9 sec but greater than 7 sec.    Ambulating Backwards Walks 20 ft, slow speed, abnormal gait pattern, evidence for  imbalance, deviates 10-15 in outside 12 in walkway width.    Steps Alternating feet, must use rail.    Total Score 19         FGA Interpretation of scores: Non-Specific Older Adults Cutoff Score: <=22/30 = risk of falls Parkinson's Disease Cutoff score <15/30 = fall risk (Hoehn & Yahr 1-4) Minimally Clinically Important Difference (MCID)  Stroke (acute, subacute, and chronic) = MDC: 4.2 points Vestibular (acute) = MDC: 6 points Community Dwelling Older Adults =  MCID: 4 points Parkinson's Disease  =  MDC: 4.3 points  PATIENT SURVEYS:  ABC scale: 480 / 1600 = 30.0 %, <69% indicates risk for recurrent falls in PD and <50% indicates a low level of physical functioning  TODAY'S TREATMENT:  09/19/23 Nustep L3x97min UE/LE Standing hip abduction 2x10 BLE Standing hip extension 2x10 BLE Standing marching YTB 2 x 10 BLE Step up and over pool noodle 2lb x10 BLE Lateral steps over pool noodle 2lb x 10 BLE Walking navigating over obstacles along counter w/ 2lb weight 4x Counter squats x 20 Seated LAQ + ball squeeze x20 Sit to stand no UE support x 20  09/16/23 Nustep L3x32min UE/LE FGA assessed Seated ball squeeze 10x3 Seated hip ABD GTB 10x3 Seated marching GTB 2 x 10 BLE Standing hip abduction x 10  Standing hip extension x 10 Standing marching x 10 Seated KTOS and figure 4 stretch x 30' Seated lunge hip flexor stretch BLE  09/09/2023 - Eval SELF CARE:  Reviewed eval findings and role of PT in addressing identified  deficits as well as need for further assessment of dynamic balance during gait.    PATIENT EDUCATION:  Education details: HEP updates- standing hip flexor stretch and added YTB to marching  Person educated: Patient Education method: Explanation Education comprehension: verbalized understanding  HOME EXERCISE PROGRAM: Access Code: [email or web address] URL: [email or web address] Date: 09/19/2023 Prepared by: Siah Steely  Exercises - Seated Piriformis  Stretch  - 2 x daily - 7 x weekly - 2 sets - 2 reps - 30 sec hold - Seated Piriformis Stretch  - 2 x daily - 7 x weekly - 2 sets - 2 reps - 30 sec hold - Standing Hip Abduction with Counter Support  - 1 x daily - 7 x weekly - 2 sets - 10 reps - Standing Hip Extension with Counter Support  - 1 x daily - 7 x weekly - 2 sets - 10 reps - Standing March with Counter Support  - 1 x daily - 7 x weekly - 2 sets - 10 reps - Standing Hip Flexor Stretch with Foot Elevated  - 2 x daily - 7 x weekly - 2 sets - 2 reps - 30 sec hold   ASSESSMENT:  CLINICAL IMPRESSION: [name] [name] showed a good tolerance for the interventions today. We worked on hip strengthening along with stepping activities to improve BLE clearance/heel strike with walking. Progressed HEP as indicated, replacing seated hip flexor stretch with standing and added YTB to marches. Intermittent cues with stepping activities to improve foot clearance.   Eval: [name] [name] is a 72 y.o. female who was referred to physical therapy for evaluation and treatment for Parkinson's disease.  She was first diagnosed with Parkinson's in 2017.  She has completed 1 prior PT episode in 2017 within the [facility] system.  Her most recent PT episode for Parkinson's disease was Aug - Dec 2024 at [facility] in [place].  Since her last PT episode, she reports changes in her mobility with worsening of shuffling gait and freezing of gait, resulting in need for increased reliance on 4WW/rollator.  She also notes increased difficulty with bed mobility of late.  Patient presents with physical impairments of decreased timing and coordination of gait, impaired ambulation, impaired standing balance, abnormal posture, bradykinesia with transfers, impaired activity tolerance, LE weakness, postural instability and decreased safety awareness impacting safe and independent functional mobility.  Examination revealed patient is at risk for falls and functional decline as evidenced by the  following objective test measures: 5xSTS of 10.79 sec (>15 sec indicates increased risk for falls and decreased BLE power), Gait speed of 2.34 ft/sec with 4WW and 2.50 ft/sec w/o AD (2.62 ft/sec is needed for safe community access), TUG of 9.63 sec with 4WW and 10.50 sec w/o AD (>13.5 sec indicates increased risk for falls), TUG Manual of 10.06 sec (difference between TUG manual and TUG >4.5 seconds indicates increased fall risk), and TUG cognitive of 12.66 sec (>/= 15 seconds indicates high risk for falls and community dwelling older adults).  TUG scores of >10% difference indicate difficulty with dual tasking.  Further testing of dynamic gait stability indicated and will be completed on next visit.  ABC scale score of 30% indicates a low level of physical functioning.  [name] [name] will benefit from skilled PT to address above deficits to improve mobility and activity tolerance to help reach the maximal level of functional independence with mobility and gait with reduced risk for falls.  Patient demonstrates understanding of this POC and is in agreement with this plan.  OBJECTIVE IMPAIRMENTS: Abnormal gait, decreased activity tolerance, decreased balance, decreased coordination, decreased knowledge of condition, decreased knowledge of use of DME, decreased mobility, difficulty walking, decreased strength, impaired perceived functional ability, increased muscle spasms, impaired flexibility, improper body mechanics, postural dysfunction, and pain.   ACTIVITY LIMITATIONS: standing, sleeping, stairs, transfers, bed mobility, bathing, locomotion level, and caring for others  PARTICIPATION LIMITATIONS: meal prep, cleaning, laundry, driving, shopping, and community activity  PERSONAL FACTORS: Fitness, Past/current experiences, Time since onset of injury/illness/exacerbation, and 3+ comorbidities: L common peroneal neuropathy (2021), headache, OA, RLS, h/o back pain are also affecting patient's functional outcome.    REHAB POTENTIAL: Good  CLINICAL DECISION MAKING: Unstable/unpredictable  EVALUATION COMPLEXITY: High   GOALS: Goals reviewed with patient? Yes  SHORT TERM GOALS: Target date: 10/21/2023  Patient will be independent with initial HEP. Baseline:  Goal status: MET- 09/19/23  2.  Patient will demonstrate improvement in overall LE muscle strength by at least 1/2 grade on MMT. Baseline: Refer to above MMT table Goal status: IN PROGRESS  3.  Patient will be educated on strategies to decrease risk of falls.  Baseline:  Goal status: MET- 09/19/23 education provided  4.  Patient will verbalize tips to reduce freezing/festination with gait and turns. Baseline:  Goal status: IN PROGRESS  LONG TERM GOALS: Target date: 12/02/2023  Patient will be independent with ongoing/advanced HEP for self-management at home incorporating PWR! Moves as indicated .  Baseline:  Goal status: IN PROGRESS  2.  Patient will be able to ambulate 600' with or w/o LRAD with good safety and without freezing of gait to access community.  Baseline:  Goal status: IN PROGRESS  3.  Patient will be able to step up/down curb safely with or w/oLRAD for safety with community ambulation.  Baseline:  Goal status: IN PROGRESS   4.  Patient will demonstrate gait speed of >/= 2.62 ft/sec to be a safe limited community ambulator with decreased risk for recurrent falls.  Baseline: 2.34 ft/sec with 4WW, 2.50 ft/sec w/o AD Goal status: IN PROGRESS  5.  Patient will demonstrate at least a 4 point improvement on FGA to improve gait stability and reduce risk for falls. (MCID = 4 points) Baseline: TBA Goal status: IN PROGRESS  6.  Patient will report >/= 43% on ABC scale to demonstrate improved balance confidence and decreased risk for falls. Baseline: 480 / 1600 = 30.0 % Goal status: IN PROGRESS  7. Patient will verbalize understanding of local Parkinson's disease community resources, including community fitness post  d/c. Baseline:  Goal status: IN PROGRESS   PLAN:  PT FREQUENCY: 2x/week  PT DURATION: 12 weeks  PLANNED INTERVENTIONS: 97164- PT Re-evaluation, 97750- Physical Performance Testing, 97110-Therapeutic exercises, 97530- Therapeutic activity, 97112- Neuromuscular re-education, 97535- Self Care, 02859- Manual therapy, [phone]- Gait training, [phone]- Electrical stimulation (unattended), [phone]- Electrical stimulation (manual), [id number]- Ultrasound, [id number]- Ionotophoresis 4mg /ml Dexamethasone , 79439 (1-2 muscles), 20561 (3+ muscles)- Dry Needling, Patient/Family education, Balance training, Stair training, Taping, Joint mobilization, DME instructions, Cryotherapy, and Moist heat  PLAN FOR NEXT SESSION: Review HEP and progress; work on improving foot clearance; provide education on fall risk prevention; instruct in tips to reduce freezing of mobility and gait   [name] [name], PTA 09/19/2023, 11:34 AM   Date of referral: 08/18/23 Referring provider: Evonnie Asberry RAMAN, DO Referring diagnosis? G20.A1 (ICD-10-CM) - Parkinson's disease without dyskinesia or fluctuating manifestations (HCC) Treatment diagnosis? (if different than referring diagnosis)  Other abnormalities of gait and mobility  Unsteadiness on feet  Muscle weakness (  generalized)  What was this (referring dx) caused by? Ongoing Issue  Lysle of Condition: Chronic (continuous duration > 3 months)   Laterality: Both  Current Functional Measure Score: Other ABC scale: 480 / 1600 = 30.0 %  Objective measurements identify impairments when they are compared to normal values, the uninvolved extremity, and prior level of function.  [x]  Yes  []  No  Objective assessment of functional ability: Moderate functional limitations   Briefly describe symptoms: [name] [name] reports changes in her mobility with worsening of shuffling gait and freezing of gait since April/May, resulting in need for increased reliance on 4WW/rollator.  She also notes increased  difficulty with bed mobility of late.  She presents with physical impairments of decreased timing and coordination of gait, impaired ambulation, impaired standing balance, abnormal posture, bradykinesia with transfers, impaired activity tolerance, LE weakness, postural instability and decreased safety awareness impacting safe and independent functional mobility.  Gait speeds of 2.34 ft/sec with 4WW and 2.50 ft/sec w/o AD indicate limited community ambulator (2.62 ft/sec is needed for safe community access).  Further testing of dynamic gait stability indicated and will be completed on next visit.  ABC scale score of 30% indicates a low level of physical functioning  How did symptoms start: PT first diagnosed in 2017 with current exacerbation of symptoms beginning in April/May of this year with increasing difficulty with mobility noted, in particular getting in and out of bed and increased shuffling and freezing of gait.  Average pain intensity:  Last 24 hours: 0/10  Past week: up to 9/10  How often does the pt experience symptoms? Frequently  How much have the symptoms interfered with usual daily activities? Quite a bit  How has condition changed since care began at this facility? NA - initial visit  In general, how is the patients overall health? Good  Onset date: PT first diagnosed in 2017 with current exacerbation of symptoms beginning in April/May 2025   BACK PAIN (STarT Back Screening Tool) - (When applicable): N/A  Has your back pain spread down your leg(s) at sometime in the last 2 weeks? []  Yes   []  No Have you had pain in the shoulder or neck at sometime in the past 2 weeks? []  Yes   []  No Have you only walked short distances because of your back pain? []  Yes   []  No In the past 2 weeks, have you dressed more slowly than usual because of your back pain? []  Yes   []  No Do you think it is not really safe for person with a condition like yours to be physically active? []  Yes   []   No Have worrying thoughts been going through your mind a lot of the time? []  Yes   []  No Do you feel that your back pain is terrible and it is never going to get any better? []  Yes   []  No In general, have you stopped enjoying all the things you usually enjoy? []  Yes   []  No Overall, how bothersome has your back pain been in the last 2 weeks? []  Not at all   []  Slightly     []  Moderate   []  Very much     []  Extremely

## 2023-09-23 ENCOUNTER — Ambulatory Visit

## 2023-09-23 DIAGNOSIS — G20A1 Parkinson's disease without dyskinesia, without mention of fluctuations: Secondary | ICD-10-CM | POA: Diagnosis not present

## 2023-09-23 DIAGNOSIS — R2681 Unsteadiness on feet: Secondary | ICD-10-CM | POA: Diagnosis not present

## 2023-09-23 DIAGNOSIS — G2581 Restless legs syndrome: Secondary | ICD-10-CM | POA: Diagnosis not present

## 2023-09-23 DIAGNOSIS — M6281 Muscle weakness (generalized): Secondary | ICD-10-CM

## 2023-09-23 DIAGNOSIS — R2689 Other abnormalities of gait and mobility: Secondary | ICD-10-CM

## 2023-09-23 NOTE — Therapy (Signed)
 OUTPATIENT PHYSICAL THERAPY PARKINSON'S TREATMENT   Patient Name: [name] [name] MRN: [id number] DOB:08/11/1950, 73 y.o., female Today's Date: 09/23/2023   END OF SESSION:  PT End of Session - 09/23/23 1320     Visit Number 4    Date for PT Re-Evaluation 12/02/23    Authorization Type [employer]    Authorization Time Period 09/09/23-12/02/23    Authorization - Visit Number 4    Authorization - Number of Visits 6    PT Start Time 1315    PT Stop Time 1358    PT Time Calculation (min) 43 min    Activity Tolerance Patient tolerated treatment well    Behavior During Therapy [facility] for tasks assessed/performed             Past Medical History:  Diagnosis Date   Bursitis    Cellulitis    arm   Common peroneal neuropathy, left 04/27/2019   Headache    Hyperlipidemia    Insomnia    OA (osteoarthritis)    Onychomycosis    Osteoarthritis    Parkinson disease (HCC) 03/23/2015   RLS (restless legs syndrome) 03/23/2015   Tenosynovitis    Past Surgical History:  Procedure Laterality Date   TUBAL LIGATION     Patient Active Problem List   Diagnosis Date Noted   Common peroneal neuropathy, left 04/27/2019   Upper airway cough syndrome 05/03/2015   RLS (restless legs syndrome) 03/23/2015   Parkinson disease (HCC) 03/23/2015   Fever 08/29/2011   Headache 08/29/2011   Knee pain, right 08/29/2011   Back pain 08/29/2011   Bacteremia due to Gram-positive bacteria 08/29/2011   Hepatitis 08/29/2011   Periorbital edema 08/29/2011   Facial rash 08/29/2011   Blurred vision, right eye 08/29/2011   Tremor 08/29/2011   Meningitis 08/29/2011   CHEST PAIN UNSPECIFIED 03/04/2007   PNEUMONIA, LEFT LOWER LOBE 03/03/2007   COUGH, CHRONIC 03/03/2007    PCP: Toribio Jerel MATSU, MD   REFERRING PROVIDER: Evonnie Asberry RAMAN, DO   REFERRING DIAG: G20.A1 (ICD-10-CM) - Parkinson's disease without dyskinesia or fluctuating manifestations (HCC)  THERAPY DIAG:  Other abnormalities of gait and  mobility  Unsteadiness on feet  Muscle weakness (generalized)  RATIONALE FOR EVALUATION AND TREATMENT: Rehabilitation  ONSET DATE: PD diagnosis in January 2017, worsening symptoms since April/May 2025  NEXT MD VISIT: 11/25/23   SUBJECTIVE:  SUBJECTIVE STATEMENT: Pt reports no recent falls, no pain.   Pt accompanied by: self  PAIN: Are you having pain? No and Yes: NPRS scale: 0/10 currently, up to 9/10 in intensity Pain location: L leg  Pain description: sore, agony, tight  Aggravating factors: unpredictable, fatigue (worn out from effort of walking)  Relieving factors: stretching   PERTINENT HISTORY:  L common peroneal neuropathy (2021), headache, OA, RLS, h/o back pain  PRECAUTIONS: None  RED FLAGS: None  WEIGHT BEARING RESTRICTIONS: No  FALLS:  Has patient fallen in last 6 months? No  LIVING ENVIRONMENT: Lives with: lives with their spouse or sister (when husband away for work) Lives in: [place] with husband, condo with sister Stairs: Yes: External: 1 steps; none - at her home Has following equipment at home: Single point cane, Environmental consultant - 4 wheeled, shower chair, and Grab bars  OCCUPATION: Retired  PLOF: Independent with household mobility with device, Independent with community mobility with device, Needs assistance with homemaking, and Leisure: scrabble, 4-5 days/week to gym until ~1 month ago  PATIENT GOALS: To be able to walk w/o the cement feeling in my feet - a steady walk w/o shuffling.   OBJECTIVE: (objective measures completed at initial evaluation unless otherwise dated)  DIAGNOSTIC FINDINGS:  05/01/2022 - MRI cervical spine IMPRESSION:  1. Stable degenerative changes of the cervical spine with mild spinal canal stenosis at C2-3, C3-4 and C5-6.  2.  Multilevel neural foraminal narrowing, severe on the right at C5-6 and moderate on the right at C3-4 and C7-T1.   COGNITION: Overall cognitive status: Within functional limits for tasks assessed   SENSATION: WFL Numbness in L great toe (intermittent)  COORDINATION: Gross motor - mild slowing  EDEMA:  Not recently  MUSCLE TONE: LLE: Mild into L ankle inversion  POSTURE:  rounded shoulders, forward head, flexed trunk , and L ankle drawing into inversion  MUSCLE LENGTH: Hamstrings: Mild tight B ITB: Mild tight L>R Piriformis: Mod tight B Hip flexors: Mod/severe tight B Quads: Mild/mod tight B Heelcord: Mild/mod tight B  LOWER EXTREMITY ROM:    Grossly WFL other than restrictions as noted above in muscle length  LOWER EXTREMITY MMT:    MMT Right eval Left eval  Hip flexion 4 4  Hip extension 3- 3-  Hip abduction 3 3  Hip adduction 4- 4-  Hip internal rotation 4 4  Hip external rotation 3+ 3+  Knee flexion 4+ 4  Knee extension 4+ 4+  Ankle dorsiflexion 4 4-  Ankle plantarflexion 5 4+   Ankle inversion 4- 3+  Ankle eversion 4 4-  (Blank rows = not tested)  BED MOBILITY:  Sit to supine SBA Supine to sit SBA Rolling to Right SBA Rolling to Left SBA  TRANSFERS: Assistive device utilized: Environmental consultant - 4 wheeled and None  Sit to stand: Modified independence Stand to sit: Modified independence Chair to chair: Modified independence Floor: NT  GAIT: Distance walked: Clinic distances Assistive device utilized: Environmental consultant - 4 wheeled and None Level of assistance: Modified independence and SBA Gait pattern: step through pattern, decreased stride length, decreased hip/knee flexion- Right, decreased hip/knee flexion- Left, shuffling, and trunk flexed Comments: No freezing demonstrated during initial eval assessment however patient reports increasing episodes of freezing of gait recently  FUNCTIONAL TESTS:  5 times sit to stand: 10.79 sec w/o UE assist Timed up and go  (TUG): 9.63 sec with 4WW; 10.50 sec w/o AD (normal), 10.06 sec (manual), 12.66 sec (cognitive - shifted from counting back by  3's to 2's mid test) 10 meter walk test: 14.00 sec with 4WW, 13.10 sec w/o AD Gait speed: 2.34 ft/sec with 4WW, 2.50 ft/sec w/o AD Functional gait assessment:   [facility] PT Assessment - 09/16/23 0001       Functional Gait  Assessment   Gait assessed  Yes    Gait Level Surface Walks 20 ft, slow speed, abnormal gait pattern, evidence for imbalance or deviates 10-15 in outside of the 12 in walkway width. Requires more than 7 sec to ambulate 20 ft.    Change in Gait Speed Able to change speed, demonstrates mild gait deviations, deviates 6-10 in outside of the 12 in walkway width, or no gait deviations, unable to achieve a major change in velocity, or uses a change in velocity, or uses an assistive device.    Gait with Horizontal Head Turns Performs head turns smoothly with slight change in gait velocity (eg, minor disruption to smooth gait path), deviates 6-10 in outside 12 in walkway width, or uses an assistive device.    Gait with Vertical Head Turns Performs task with slight change in gait velocity (eg, minor disruption to smooth gait path), deviates 6 - 10 in outside 12 in walkway width or uses assistive device    Gait and Pivot Turn Pivot turns safely within 3 sec and stops quickly with no loss of balance.    Step Over Obstacle Is able to step over one shoe box (4.5 in total height) without changing gait speed. No evidence of imbalance.    Gait with Narrow Base of Support Ambulates 7-9 steps.    Gait with Eyes Closed Walks 20 ft, uses assistive device, slower speed, mild gait deviations, deviates 6-10 in outside 12 in walkway width. Ambulates 20 ft in less than 9 sec but greater than 7 sec.    Ambulating Backwards Walks 20 ft, slow speed, abnormal gait pattern, evidence for imbalance, deviates 10-15 in outside 12 in walkway width.    Steps Alternating feet, must use rail.     Total Score 19         FGA Interpretation of scores: Non-Specific Older Adults Cutoff Score: <=22/30 = risk of falls Parkinson's Disease Cutoff score <15/30 = fall risk (Hoehn & Yahr 1-4) Minimally Clinically Important Difference (MCID)  Stroke (acute, subacute, and chronic) = MDC: 4.2 points Vestibular (acute) = MDC: 6 points Community Dwelling Older Adults =  MCID: 4 points Parkinson's Disease  =  MDC: 4.3 points  PATIENT SURVEYS:  ABC scale: 480 / 1600 = 30.0 %, <69% indicates risk for recurrent falls in PD and <50% indicates a low level of physical functioning  TODAY'S TREATMENT:  09/23/23 Nustep L5x68min UE/LE Seated LAQ 3lb 2 x 10 BLE Stepping in four corners CW/CCW 3lb weights x 10 BLE Stepping over full foam roll 3lb weight x 10 B- LOB x 1, MinA for recovery Lateral stepping over foam roll 3lb weights x 10 BLE Sidesteps along counter YTB at ankles 3x down/back Review of ways to prevent freezing of gait checklist  09/19/23 Nustep L3x70min UE/LE Standing hip abduction 2x10 BLE Standing hip extension 2x10 BLE Standing marching YTB 2 x 10 BLE Step up and over pool noodle 2lb x10 BLE Lateral steps over pool noodle 2lb x 10 BLE Walking navigating over obstacles along counter w/ 2lb weight 4x Counter squats x 20 Seated LAQ + ball squeeze x20 Sit to stand no UE support x 20  09/16/23 Nustep L3x51min UE/LE FGA assessed Seated ball squeeze 10x3  Seated hip ABD GTB 10x3 Seated marching GTB 2 x 10 BLE Standing hip abduction x 10  Standing hip extension x 10 Standing marching x 10 Seated KTOS and figure 4 stretch x 30' Seated lunge hip flexor stretch BLE  09/09/2023 - Eval SELF CARE:  Reviewed eval findings and role of PT in addressing identified deficits as well as need for further assessment of dynamic balance during gait.    PATIENT EDUCATION:  Education details: HEP updates- standing hip flexor stretch and added YTB to marching ; freezing of gait checklist Person  educated: Patient Education method: Explanation Education comprehension: verbalized understanding  HOME EXERCISE PROGRAM: Access Code: [email or web address] URL: [email or web address] Date: 09/23/2023 Prepared by: Myleka Moncure  Exercises - Seated Piriformis Stretch  - 2 x daily - 7 x weekly - 2 sets - 2 reps - 30 sec hold - Seated Piriformis Stretch  - 2 x daily - 7 x weekly - 2 sets - 2 reps - 30 sec hold - Standing Hip Abduction with Counter Support  - 1 x daily - 7 x weekly - 2 sets - 10 reps - Standing Hip Extension with Counter Support  - 1 x daily - 7 x weekly - 2 sets - 10 reps - Standing March with Counter Support  - 1 x daily - 7 x weekly - 2 sets - 10 reps - Standing Hip Flexor Stretch with Foot Elevated  - 2 x daily - 7 x weekly - 2 sets - 2 reps - 30 sec hold  Patient Education - Tips to reduce freezing episodes with standing or walking   ASSESSMENT:  CLINICAL IMPRESSION: [name] [name] showed a good tolerance for the interventions today. We worked on hip strengthening along with stepping activities to improve BLE clearance/heel strike with walking. Cues were needed to correct form and to pick up her feet with stepping activities. LOB x 1 with stepping over foam roll with RLE. Reviewed freezing of gait prevention strategies with her today as well.   Eval: [name] [name] is a 73 y.o. female who was referred to physical therapy for evaluation and treatment for Parkinson's disease.  She was first diagnosed with Parkinson's in 2017.  She has completed 1 prior PT episode in 2017 within the [facility] system.  Her most recent PT episode for Parkinson's disease was Aug - Dec 2024 at [facility] in [place].  Since her last PT episode, she reports changes in her mobility with worsening of shuffling gait and freezing of gait, resulting in need for increased reliance on 4WW/rollator.  She also notes increased difficulty with bed mobility of late.  Patient presents with physical impairments  of decreased timing and coordination of gait, impaired ambulation, impaired standing balance, abnormal posture, bradykinesia with transfers, impaired activity tolerance, LE weakness, postural instability and decreased safety awareness impacting safe and independent functional mobility.  Examination revealed patient is at risk for falls and functional decline as evidenced by the following objective test measures: 5xSTS of 10.79 sec (>15 sec indicates increased risk for falls and decreased BLE power), Gait speed of 2.34 ft/sec with 4WW and 2.50 ft/sec w/o AD (2.62 ft/sec is needed for safe community access), TUG of 9.63 sec with 4WW and 10.50 sec w/o AD (>13.5 sec indicates increased risk for falls), TUG Manual of 10.06 sec (difference between TUG manual and TUG >4.5 seconds indicates increased fall risk), and TUG cognitive of 12.66 sec (>/= 15 seconds indicates high risk for falls and community dwelling older adults).  TUG  scores of >10% difference indicate difficulty with dual tasking.  Further testing of dynamic gait stability indicated and will be completed on next visit.  ABC scale score of 30% indicates a low level of physical functioning.  [name] [name] will benefit from skilled PT to address above deficits to improve mobility and activity tolerance to help reach the maximal level of functional independence with mobility and gait with reduced risk for falls.  Patient demonstrates understanding of this POC and is in agreement with this plan.   OBJECTIVE IMPAIRMENTS: Abnormal gait, decreased activity tolerance, decreased balance, decreased coordination, decreased knowledge of condition, decreased knowledge of use of DME, decreased mobility, difficulty walking, decreased strength, impaired perceived functional ability, increased muscle spasms, impaired flexibility, improper body mechanics, postural dysfunction, and pain.   ACTIVITY LIMITATIONS: standing, sleeping, stairs, transfers, bed mobility, bathing, locomotion  level, and caring for others  PARTICIPATION LIMITATIONS: meal prep, cleaning, laundry, driving, shopping, and community activity  PERSONAL FACTORS: Fitness, Past/current experiences, Time since onset of injury/illness/exacerbation, and 3+ comorbidities: L common peroneal neuropathy (2021), headache, OA, RLS, h/o back pain are also affecting patient's functional outcome.   REHAB POTENTIAL: Good  CLINICAL DECISION MAKING: Unstable/unpredictable  EVALUATION COMPLEXITY: High   GOALS: Goals reviewed with patient? Yes  SHORT TERM GOALS: Target date: 10/21/2023  Patient will be independent with initial HEP. Baseline:  Goal status: MET- 09/19/23  2.  Patient will demonstrate improvement in overall LE muscle strength by at least 1/2 grade on MMT. Baseline: Refer to above MMT table Goal status: IN PROGRESS  3.  Patient will be educated on strategies to decrease risk of falls.  Baseline:  Goal status: MET- 09/19/23 education provided  4.  Patient will verbalize tips to reduce freezing/festination with gait and turns. Baseline:  Goal status: IN PROGRESS- 09/23/23 able to verbalize some of the tips  LONG TERM GOALS: Target date: 12/02/2023  Patient will be independent with ongoing/advanced HEP for self-management at home incorporating [employer]! Moves as indicated .  Baseline:  Goal status: IN PROGRESS  2.  Patient will be able to ambulate 600' with or w/o LRAD with good safety and without freezing of gait to access community.  Baseline:  Goal status: IN PROGRESS  3.  Patient will be able to step up/down curb safely with or w/oLRAD for safety with community ambulation.  Baseline:  Goal status: IN PROGRESS   4.  Patient will demonstrate gait speed of >/= 2.62 ft/sec to be a safe limited community ambulator with decreased risk for recurrent falls.  Baseline: 2.34 ft/sec with 4WW, 2.50 ft/sec w/o AD Goal status: IN PROGRESS  5.  Patient will demonstrate at least a 4 point improvement on FGA  to improve gait stability and reduce risk for falls. (MCID = 4 points) Baseline: TBA Goal status: IN PROGRESS  6.  Patient will report >/= 43% on ABC scale to demonstrate improved balance confidence and decreased risk for falls. Baseline: 480 / 1600 = 30.0 % Goal status: IN PROGRESS  7. Patient will verbalize understanding of local Parkinson's disease community resources, including community fitness post d/c. Baseline:  Goal status: IN PROGRESS   PLAN:  PT FREQUENCY: 2x/week  PT DURATION: 12 weeks  PLANNED INTERVENTIONS: 97164- PT Re-evaluation, 97750- Physical Performance Testing, 97110-Therapeutic exercises, 97530- Therapeutic activity, [id number]- Neuromuscular re-education, 97535- Self Care, [id number]- Manual therapy, [id number]- Gait training, [phone]- Electrical stimulation (unattended), [phone]- Electrical stimulation (manual), [id number]- Ultrasound, [id number]- Ionotophoresis 4mg /ml Dexamethasone , 79439 (1-2 muscles), 20561 (3+ muscles)- Dry Needling, Patient/Family education, Balance training,  Stair training, Taping, Joint mobilization, DME instructions, Cryotherapy, and Moist heat  PLAN FOR NEXT SESSION: Review HEP and progress; work on improving foot clearance; provide education on fall risk prevention; instruct in tips to reduce freezing of mobility and gait   [name] [name], PTA 09/23/2023, 2:08 PM   Date of referral: 08/18/23 Referring provider: Evonnie Asberry RAMAN, DO Referring diagnosis? G20.A1 (ICD-10-CM) - Parkinson's disease without dyskinesia or fluctuating manifestations (HCC) Treatment diagnosis? (if different than referring diagnosis)  Other abnormalities of gait and mobility  Unsteadiness on feet  Muscle weakness (generalized)  What was this (referring dx) caused by? Ongoing Issue  Lysle of Condition: Chronic (continuous duration > 3 months)   Laterality: Both  Current Functional Measure Score: Other ABC scale: 480 / 1600 = 30.0 %  Objective measurements identify impairments  when they are compared to normal values, the uninvolved extremity, and prior level of function.  [x]  Yes  []  No  Objective assessment of functional ability: Moderate functional limitations   Briefly describe symptoms: [name] [name] reports changes in her mobility with worsening of shuffling gait and freezing of gait since April/May, resulting in need for increased reliance on 4WW/rollator.  She also notes increased difficulty with bed mobility of late.  She presents with physical impairments of decreased timing and coordination of gait, impaired ambulation, impaired standing balance, abnormal posture, bradykinesia with transfers, impaired activity tolerance, LE weakness, postural instability and decreased safety awareness impacting safe and independent functional mobility.  Gait speeds of 2.34 ft/sec with 4WW and 2.50 ft/sec w/o AD indicate limited community ambulator (2.62 ft/sec is needed for safe community access).  Further testing of dynamic gait stability indicated and will be completed on next visit.  ABC scale score of 30% indicates a low level of physical functioning  How did symptoms start: PT first diagnosed in 2017 with current exacerbation of symptoms beginning in April/May of this year with increasing difficulty with mobility noted, in particular getting in and out of bed and increased shuffling and freezing of gait.  Average pain intensity:  Last 24 hours: 0/10  Past week: up to 9/10  How often does the pt experience symptoms? Frequently  How much have the symptoms interfered with usual daily activities? Quite a bit  How has condition changed since care began at this facility? NA - initial visit  In general, how is the patients overall health? Good  Onset date: PT first diagnosed in 2017 with current exacerbation of symptoms beginning in April/May 2025   BACK PAIN (STarT Back Screening Tool) - (When applicable): N/A  Has your back pain spread down your leg(s) at sometime in the last  2 weeks? []  Yes   []  No Have you had pain in the shoulder or neck at sometime in the past 2 weeks? []  Yes   []  No Have you only walked short distances because of your back pain? []  Yes   []  No In the past 2 weeks, have you dressed more slowly than usual because of your back pain? []  Yes   []  No Do you think it is not really safe for person with a condition like yours to be physically active? []  Yes   []  No Have worrying thoughts been going through your mind a lot of the time? []  Yes   []  No Do you feel that your back pain is terrible and it is never going to get any better? []  Yes   []  No In general, have you stopped enjoying all the  things you usually enjoy? []  Yes   []  No Overall, how bothersome has your back pain been in the last 2 weeks? []  Not at all   []  Slightly     []  Moderate   []  Very much     []  Extremely

## 2023-09-26 ENCOUNTER — Ambulatory Visit: Attending: Neurology

## 2023-09-26 DIAGNOSIS — M6281 Muscle weakness (generalized): Secondary | ICD-10-CM | POA: Diagnosis not present

## 2023-09-26 DIAGNOSIS — R2681 Unsteadiness on feet: Secondary | ICD-10-CM | POA: Diagnosis not present

## 2023-09-26 DIAGNOSIS — R2689 Other abnormalities of gait and mobility: Secondary | ICD-10-CM | POA: Diagnosis not present

## 2023-09-26 NOTE — Therapy (Signed)
 OUTPATIENT PHYSICAL THERAPY PARKINSON'S TREATMENT   Patient Name: PAISLY FINGERHUT MRN: [id number] DOB:1950/09/16, 73 y.o., female Today's Date: 09/26/2023   END OF SESSION:  PT End of Session - 09/26/23 1157     Visit Number 5    Date for PT Re-Evaluation 12/02/23    Authorization Type [employer]    Authorization Time Period 09/09/23-12/02/23    Authorization - Visit Number 5    Authorization - Number of Visits 6    PT Start Time 1107    PT Stop Time 1149    PT Time Calculation (min) 42 min    Activity Tolerance Patient tolerated treatment well    Behavior During Therapy [facility] for tasks assessed/performed              Past Medical History:  Diagnosis Date   Bursitis    Cellulitis    arm   Common peroneal neuropathy, left 04/27/2019   Headache    Hyperlipidemia    Insomnia    OA (osteoarthritis)    Onychomycosis    Osteoarthritis    Parkinson disease (HCC) 03/23/2015   RLS (restless legs syndrome) 03/23/2015   Tenosynovitis    Past Surgical History:  Procedure Laterality Date   TUBAL LIGATION     Patient Active Problem List   Diagnosis Date Noted   Common peroneal neuropathy, left 04/27/2019   Upper airway cough syndrome 05/03/2015   RLS (restless legs syndrome) 03/23/2015   Parkinson disease (HCC) 03/23/2015   Fever 08/29/2011   Headache 08/29/2011   Knee pain, right 08/29/2011   Back pain 08/29/2011   Bacteremia due to Gram-positive bacteria 08/29/2011   Hepatitis 08/29/2011   Periorbital edema 08/29/2011   Facial rash 08/29/2011   Blurred vision, right eye 08/29/2011   Tremor 08/29/2011   Meningitis 08/29/2011   CHEST PAIN UNSPECIFIED 03/04/2007   PNEUMONIA, LEFT LOWER LOBE 03/03/2007   COUGH, CHRONIC 03/03/2007    PCP: Toribio Jerel MATSU, MD   REFERRING PROVIDER: Evonnie Asberry RAMAN, DO   REFERRING DIAG: G20.A1 (ICD-10-CM) - Parkinson's disease without dyskinesia or fluctuating manifestations (HCC)  THERAPY DIAG:  Other abnormalities of gait and  mobility  Unsteadiness on feet  Muscle weakness (generalized)  RATIONALE FOR EVALUATION AND TREATMENT: Rehabilitation  ONSET DATE: PD diagnosis in January 2017, worsening symptoms since April/May 2025  NEXT MD VISIT: 11/25/23   SUBJECTIVE:  SUBJECTIVE STATEMENT: Pt reports feeling nauseous today, a little weak with some tightness along bil scapulae and shoulders.  Pt accompanied by: self  PAIN: Are you having pain? No and Yes: NPRS scale: 0/10 currently, up to 9/10 in intensity Pain location: L leg  Pain description: sore, agony, tight  Aggravating factors: unpredictable, fatigue (worn out from effort of walking)  Relieving factors: stretching   PERTINENT HISTORY:  L common peroneal neuropathy (2021), headache, OA, RLS, h/o back pain  PRECAUTIONS: None  RED FLAGS: None  WEIGHT BEARING RESTRICTIONS: No  FALLS:  Has patient fallen in last 6 months? No  LIVING ENVIRONMENT: Lives with: lives with their spouse or sister (when husband away for work) Lives in: [place] with husband, condo with sister Stairs: Yes: External: 1 steps; none - at her home Has following equipment at home: Single point cane, Environmental consultant - 4 wheeled, shower chair, and Grab bars  OCCUPATION: Retired  PLOF: Independent with household mobility with device, Independent with community mobility with device, Needs assistance with homemaking, and Leisure: scrabble, 4-5 days/week to gym until ~1 month ago  PATIENT GOALS: To be able to walk w/o the cement feeling in my feet - a steady walk w/o shuffling.   OBJECTIVE: (objective measures completed at initial evaluation unless otherwise dated)  DIAGNOSTIC FINDINGS:  05/01/2022 - MRI cervical spine IMPRESSION:  1. Stable degenerative changes of the cervical spine  with mild spinal canal stenosis at C2-3, C3-4 and C5-6.  2. Multilevel neural foraminal narrowing, severe on the right at C5-6 and moderate on the right at C3-4 and C7-T1.   COGNITION: Overall cognitive status: Within functional limits for tasks assessed   SENSATION: WFL Numbness in L great toe (intermittent)  COORDINATION: Gross motor - mild slowing  EDEMA:  Not recently  MUSCLE TONE: LLE: Mild into L ankle inversion  POSTURE:  rounded shoulders, forward head, flexed trunk , and L ankle drawing into inversion  MUSCLE LENGTH: Hamstrings: Mild tight B ITB: Mild tight L>R Piriformis: Mod tight B Hip flexors: Mod/severe tight B Quads: Mild/mod tight B Heelcord: Mild/mod tight B  LOWER EXTREMITY ROM:    Grossly WFL other than restrictions as noted above in muscle length  LOWER EXTREMITY MMT:    MMT Right eval Left eval  Hip flexion 4 4  Hip extension 3- 3-  Hip abduction 3 3  Hip adduction 4- 4-  Hip internal rotation 4 4  Hip external rotation 3+ 3+  Knee flexion 4+ 4  Knee extension 4+ 4+  Ankle dorsiflexion 4 4-  Ankle plantarflexion 5 4+   Ankle inversion 4- 3+  Ankle eversion 4 4-  (Blank rows = not tested)  BED MOBILITY:  Sit to supine SBA Supine to sit SBA Rolling to Right SBA Rolling to Left SBA  TRANSFERS: Assistive device utilized: Environmental consultant - 4 wheeled and None  Sit to stand: Modified independence Stand to sit: Modified independence Chair to chair: Modified independence Floor: NT  GAIT: Distance walked: Clinic distances Assistive device utilized: Environmental consultant - 4 wheeled and None Level of assistance: Modified independence and SBA Gait pattern: step through pattern, decreased stride length, decreased hip/knee flexion- Right, decreased hip/knee flexion- Left, shuffling, and trunk flexed Comments: No freezing demonstrated during initial eval assessment however patient reports increasing episodes of freezing of gait recently  FUNCTIONAL TESTS:  5 times  sit to stand: 10.79 sec w/o UE assist Timed up and go (TUG): 9.63 sec with 4WW; 10.50 sec w/o AD (normal), 10.06 sec (manual), 12.66  sec (cognitive - shifted from counting back by 3's to 2's mid test) 10 meter walk test: 14.00 sec with 4WW, 13.10 sec w/o AD Gait speed: 2.34 ft/sec with 4WW, 2.50 ft/sec w/o AD Functional gait assessment:   [facility] PT Assessment - 09/16/23 0001       Functional Gait  Assessment   Gait assessed  Yes    Gait Level Surface Walks 20 ft, slow speed, abnormal gait pattern, evidence for imbalance or deviates 10-15 in outside of the 12 in walkway width. Requires more than 7 sec to ambulate 20 ft.    Change in Gait Speed Able to change speed, demonstrates mild gait deviations, deviates 6-10 in outside of the 12 in walkway width, or no gait deviations, unable to achieve a major change in velocity, or uses a change in velocity, or uses an assistive device.    Gait with Horizontal Head Turns Performs head turns smoothly with slight change in gait velocity (eg, minor disruption to smooth gait path), deviates 6-10 in outside 12 in walkway width, or uses an assistive device.    Gait with Vertical Head Turns Performs task with slight change in gait velocity (eg, minor disruption to smooth gait path), deviates 6 - 10 in outside 12 in walkway width or uses assistive device    Gait and Pivot Turn Pivot turns safely within 3 sec and stops quickly with no loss of balance.    Step Over Obstacle Is able to step over one shoe box (4.5 in total height) without changing gait speed. No evidence of imbalance.    Gait with Narrow Base of Support Ambulates 7-9 steps.    Gait with Eyes Closed Walks 20 ft, uses assistive device, slower speed, mild gait deviations, deviates 6-10 in outside 12 in walkway width. Ambulates 20 ft in less than 9 sec but greater than 7 sec.    Ambulating Backwards Walks 20 ft, slow speed, abnormal gait pattern, evidence for imbalance, deviates 10-15 in outside 12 in walkway  width.    Steps Alternating feet, must use rail.    Total Score 19         FGA Interpretation of scores: Non-Specific Older Adults Cutoff Score: <=22/30 = risk of falls Parkinson's Disease Cutoff score <15/30 = fall risk (Hoehn & Yahr 1-4) Minimally Clinically Important Difference (MCID)  Stroke (acute, subacute, and chronic) = MDC: 4.2 points Vestibular (acute) = MDC: 6 points Community Dwelling Older Adults =  MCID: 4 points Parkinson's Disease  =  MDC: 4.3 points  PATIENT SURVEYS:  ABC scale: 480 / 1600 = 30.0 %, <69% indicates risk for recurrent falls in PD and <50% indicates a low level of physical functioning  TODAY'S TREATMENT:  09/26/23 Nustep L5x31min UE/LE Standing shoulder ext RTB x 20 Seated rows RTB x 20 Standing step and reach x 10 B Lateral step and reach x 10 B Reverse chop RTB 2x10 B  Sit to stand x 20 with yellow weight ball + OHP  Seated LAQ 3lb x 20 BLE  09/23/23 Nustep L5x68min UE/LE Seated LAQ 3lb 2 x 10 BLE Stepping in four corners CW/CCW 3lb weights x 10 BLE Stepping over full foam roll 3lb weight x 10 B- LOB x 1, MinA for recovery Lateral stepping over foam roll 3lb weights x 10 BLE Sidesteps along counter YTB at ankles 3x down/back Review of ways to prevent freezing of gait checklist  09/19/23 Nustep L3x78min UE/LE Standing hip abduction 2x10 BLE Standing hip extension 2x10 BLE Standing marching  YTB 2 x 10 BLE Step up and over pool noodle 2lb x10 BLE Lateral steps over pool noodle 2lb x 10 BLE Walking navigating over obstacles along counter w/ 2lb weight 4x Counter squats x 20 Seated LAQ + ball squeeze x20 Sit to stand no UE support x 20  09/16/23 Nustep L3x40min UE/LE FGA assessed Seated ball squeeze 10x3 Seated hip ABD GTB 10x3 Seated marching GTB 2 x 10 BLE Standing hip abduction x 10  Standing hip extension x 10 Standing marching x 10 Seated KTOS and figure 4 stretch x 30' Seated lunge hip flexor stretch BLE  09/09/2023 -  Eval SELF CARE:  Reviewed eval findings and role of PT in addressing identified deficits as well as need for further assessment of dynamic balance during gait.    PATIENT EDUCATION:  Education details: HEP updates- standing hip flexor stretch and added YTB to marching ; freezing of gait checklist Person educated: Patient Education method: Explanation Education comprehension: verbalized understanding  HOME EXERCISE PROGRAM: Access Code: [email or web address] URL: [email or web address] Date: 09/23/2023 Prepared by: Gayl Ivanoff  Exercises - Seated Piriformis Stretch  - 2 x daily - 7 x weekly - 2 sets - 2 reps - 30 sec hold - Seated Piriformis Stretch  - 2 x daily - 7 x weekly - 2 sets - 2 reps - 30 sec hold - Standing Hip Abduction with Counter Support  - 1 x daily - 7 x weekly - 2 sets - 10 reps - Standing Hip Extension with Counter Support  - 1 x daily - 7 x weekly - 2 sets - 10 reps - Standing March with Counter Support  - 1 x daily - 7 x weekly - 2 sets - 10 reps - Standing Hip Flexor Stretch with Foot Elevated  - 2 x daily - 7 x weekly - 2 sets - 2 reps - 30 sec hold  Patient Education - Tips to reduce freezing episodes with standing or walking   ASSESSMENT:  CLINICAL IMPRESSION: Pt responded well to treatment. Initially she was not feeling up to exercise but as the session went on she started feeling better. We started postural strengthening to address tightness/soreness along posterior shoulders. Incorporated more rotational movements as well to reduce rigidity with movement  Eval: Sharnese P Nan is a 73 y.o. female who was referred to physical therapy for evaluation and treatment for Parkinson's disease.  She was first diagnosed with Parkinson's in 2017.  She has completed 1 prior PT episode in 2017 within the [facility] system.  Her most recent PT episode for Parkinson's disease was Aug - Dec 2024 at [facility] in [place].  Since her last PT episode, she reports changes  in her mobility with worsening of shuffling gait and freezing of gait, resulting in need for increased reliance on 4WW/rollator.  She also notes increased difficulty with bed mobility of late.  Patient presents with physical impairments of decreased timing and coordination of gait, impaired ambulation, impaired standing balance, abnormal posture, bradykinesia with transfers, impaired activity tolerance, LE weakness, postural instability and decreased safety awareness impacting safe and independent functional mobility.  Examination revealed patient is at risk for falls and functional decline as evidenced by the following objective test measures: 5xSTS of 10.79 sec (>15 sec indicates increased risk for falls and decreased BLE power), Gait speed of 2.34 ft/sec with 4WW and 2.50 ft/sec w/o AD (2.62 ft/sec is needed for safe community access), TUG of 9.63 sec with 4WW and 10.50 sec w/o AD (>13.5  sec indicates increased risk for falls), TUG Manual of 10.06 sec (difference between TUG manual and TUG >4.5 seconds indicates increased fall risk), and TUG cognitive of 12.66 sec (>/= 15 seconds indicates high risk for falls and community dwelling older adults).  TUG scores of >10% difference indicate difficulty with dual tasking.  Further testing of dynamic gait stability indicated and will be completed on next visit.  ABC scale score of 30% indicates a low level of physical functioning.  Betsaida will benefit from skilled PT to address above deficits to improve mobility and activity tolerance to help reach the maximal level of functional independence with mobility and gait with reduced risk for falls.  Patient demonstrates understanding of this POC and is in agreement with this plan.   OBJECTIVE IMPAIRMENTS: Abnormal gait, decreased activity tolerance, decreased balance, decreased coordination, decreased knowledge of condition, decreased knowledge of use of DME, decreased mobility, difficulty walking, decreased strength,  impaired perceived functional ability, increased muscle spasms, impaired flexibility, improper body mechanics, postural dysfunction, and pain.   ACTIVITY LIMITATIONS: standing, sleeping, stairs, transfers, bed mobility, bathing, locomotion level, and caring for others  PARTICIPATION LIMITATIONS: meal prep, cleaning, laundry, driving, shopping, and community activity  PERSONAL FACTORS: Fitness, Past/current experiences, Time since onset of injury/illness/exacerbation, and 3+ comorbidities: L common peroneal neuropathy (2021), headache, OA, RLS, h/o back pain are also affecting patient's functional outcome.   REHAB POTENTIAL: Good  CLINICAL DECISION MAKING: Unstable/unpredictable  EVALUATION COMPLEXITY: High   GOALS: Goals reviewed with patient? Yes  SHORT TERM GOALS: Target date: 10/21/2023  Patient will be independent with initial HEP. Baseline:  Goal status: MET- 09/19/23  2.  Patient will demonstrate improvement in overall LE muscle strength by at least 1/2 grade on MMT. Baseline: Refer to above MMT table Goal status: IN PROGRESS  3.  Patient will be educated on strategies to decrease risk of falls.  Baseline:  Goal status: MET- 09/19/23 education provided  4.  Patient will verbalize tips to reduce freezing/festination with gait and turns. Baseline:  Goal status: IN PROGRESS- 09/23/23 able to verbalize some of the tips  LONG TERM GOALS: Target date: 12/02/2023  Patient will be independent with ongoing/advanced HEP for self-management at home incorporating PWR! Moves as indicated .  Baseline:  Goal status: IN PROGRESS  2.  Patient will be able to ambulate 600' with or w/o LRAD with good safety and without freezing of gait to access community.  Baseline:  Goal status: IN PROGRESS  3.  Patient will be able to step up/down curb safely with or w/oLRAD for safety with community ambulation.  Baseline:  Goal status: IN PROGRESS   4.  Patient will demonstrate gait speed of >/=  2.62 ft/sec to be a safe limited community ambulator with decreased risk for recurrent falls.  Baseline: 2.34 ft/sec with 4WW, 2.50 ft/sec w/o AD Goal status: IN PROGRESS  5.  Patient will demonstrate at least a 4 point improvement on FGA to improve gait stability and reduce risk for falls. (MCID = 4 points) Baseline: TBA Goal status: IN PROGRESS  6.  Patient will report >/= 43% on ABC scale to demonstrate improved balance confidence and decreased risk for falls. Baseline: 480 / 1600 = 30.0 % Goal status: IN PROGRESS  7. Patient will verbalize understanding of local Parkinson's disease community resources, including community fitness post d/c. Baseline:  Goal status: IN PROGRESS   PLAN:  PT FREQUENCY: 2x/week  PT DURATION: 12 weeks  PLANNED INTERVENTIONS: 97164- PT Re-evaluation, 97750- Physical Performance  Testing, 97110-Therapeutic exercises, 97530- Therapeutic activity, [id number]- Neuromuscular re-education, [phone]- Self Care, 02859- Manual therapy, [phone]- Gait training, [phone]- Electrical stimulation (unattended), [phone]- Electrical stimulation (manual), [id number]- Ultrasound, [id number]- Ionotophoresis 4mg /ml Dexamethasone , 79439 (1-2 muscles), [id number] (3+ muscles)- Dry Needling, Patient/Family education, Balance training, Stair training, Taping, Joint mobilization, DME instructions, Cryotherapy, and Moist heat  PLAN FOR NEXT SESSION: Resubmit for more visits; work on improving foot clearance; postural strengthening; rotational movements   Sol LITTIE Gaskins, PTA 09/26/2023, 12:01 PM   Date of referral: 08/18/23 Referring provider: Evonnie Asberry RAMAN, DO Referring diagnosis? G20.A1 (ICD-10-CM) - Parkinson's disease without dyskinesia or fluctuating manifestations (HCC) Treatment diagnosis? (if different than referring diagnosis)  Other abnormalities of gait and mobility  Unsteadiness on feet  Muscle weakness (generalized)  What was this (referring dx) caused by? Ongoing Issue  Lysle of  Condition: Chronic (continuous duration > 3 months)   Laterality: Both  Current Functional Measure Score: Other ABC scale: 480 / 1600 = 30.0 %  Objective measurements identify impairments when they are compared to normal values, the uninvolved extremity, and prior level of function.  [x]  Yes  []  No  Objective assessment of functional ability: Moderate functional limitations   Briefly describe symptoms: Dalilah reports changes in her mobility with worsening of shuffling gait and freezing of gait since April/May, resulting in need for increased reliance on 4WW/rollator.  She also notes increased difficulty with bed mobility of late.  She presents with physical impairments of decreased timing and coordination of gait, impaired ambulation, impaired standing balance, abnormal posture, bradykinesia with transfers, impaired activity tolerance, LE weakness, postural instability and decreased safety awareness impacting safe and independent functional mobility.  Gait speeds of 2.34 ft/sec with 4WW and 2.50 ft/sec w/o AD indicate limited community ambulator (2.62 ft/sec is needed for safe community access).  Further testing of dynamic gait stability indicated and will be completed on next visit.  ABC scale score of 30% indicates a low level of physical functioning  How did symptoms start: PT first diagnosed in 2017 with current exacerbation of symptoms beginning in April/May of this year with increasing difficulty with mobility noted, in particular getting in and out of bed and increased shuffling and freezing of gait.  Average pain intensity:  Last 24 hours: 0/10  Past week: up to 9/10  How often does the pt experience symptoms? Frequently  How much have the symptoms interfered with usual daily activities? Quite a bit  How has condition changed since care began at this facility? NA - initial visit  In general, how is the patients overall health? Good  Onset date: PT first diagnosed in 2017 with  current exacerbation of symptoms beginning in April/May 2025   BACK PAIN (STarT Back Screening Tool) - (When applicable): N/A  Has your back pain spread down your leg(s) at sometime in the last 2 weeks? []  Yes   []  No Have you had pain in the shoulder or neck at sometime in the past 2 weeks? []  Yes   []  No Have you only walked short distances because of your back pain? []  Yes   []  No In the past 2 weeks, have you dressed more slowly than usual because of your back pain? []  Yes   []  No Do you think it is not really safe for person with a condition like yours to be physically active? []  Yes   []  No Have worrying thoughts been going through your mind a lot of the time? []  Yes   []  No  Do you feel that your back pain is terrible and it is never going to get any better? []  Yes   []  No In general, have you stopped enjoying all the things you usually enjoy? []  Yes   []  No Overall, how bothersome has your back pain been in the last 2 weeks? []  Not at all   []  Slightly     []  Moderate   []  Very much     []  Extremely

## 2023-09-30 ENCOUNTER — Ambulatory Visit

## 2023-09-30 DIAGNOSIS — R2689 Other abnormalities of gait and mobility: Secondary | ICD-10-CM | POA: Diagnosis not present

## 2023-09-30 DIAGNOSIS — M6281 Muscle weakness (generalized): Secondary | ICD-10-CM | POA: Diagnosis not present

## 2023-09-30 DIAGNOSIS — R2681 Unsteadiness on feet: Secondary | ICD-10-CM | POA: Diagnosis not present

## 2023-09-30 NOTE — Therapy (Signed)
 OUTPATIENT PHYSICAL THERAPY PARKINSON'S TREATMENT   Patient Name: [name] [name] MRN: [id number] DOB:1950-04-15, 73 y.o., female Today's Date: 09/30/2023   END OF SESSION:  PT End of Session - 09/30/23 1317     Visit Number 6    Date for PT Re-Evaluation 12/02/23    Authorization Type [employer]    Authorization Time Period 09/09/23-12/02/23    Authorization - Visit Number 6    Authorization - Number of Visits 6    PT Start Time 1315    PT Stop Time 1405    PT Time Calculation (min) 50 min    Activity Tolerance Patient tolerated treatment well    Behavior During Therapy [facility] for tasks assessed/performed              Past Medical History:  Diagnosis Date   Bursitis    Cellulitis    arm   Common peroneal neuropathy, left 04/27/2019   Headache    Hyperlipidemia    Insomnia    OA (osteoarthritis)    Onychomycosis    Osteoarthritis    Parkinson disease (HCC) 03/23/2015   RLS (restless legs syndrome) 03/23/2015   Tenosynovitis    Past Surgical History:  Procedure Laterality Date   TUBAL LIGATION     Patient Active Problem List   Diagnosis Date Noted   Common peroneal neuropathy, left 04/27/2019   Upper airway cough syndrome 05/03/2015   RLS (restless legs syndrome) 03/23/2015   Parkinson disease (HCC) 03/23/2015   Fever 08/29/2011   Headache 08/29/2011   Knee pain, right 08/29/2011   Back pain 08/29/2011   Bacteremia due to Gram-positive bacteria 08/29/2011   Hepatitis 08/29/2011   Periorbital edema 08/29/2011   Facial rash 08/29/2011   Blurred vision, right eye 08/29/2011   Tremor 08/29/2011   Meningitis 08/29/2011   CHEST PAIN UNSPECIFIED 03/04/2007   PNEUMONIA, LEFT LOWER LOBE 03/03/2007   COUGH, CHRONIC 03/03/2007    PCP: Toribio Jerel MATSU, MD   REFERRING PROVIDER: Evonnie Asberry RAMAN, DO   REFERRING DIAG: G20.A1 (ICD-10-CM) - Parkinson's disease without dyskinesia or fluctuating manifestations (HCC)  THERAPY DIAG:  Other abnormalities of gait and  mobility  Unsteadiness on feet  Muscle weakness (generalized)  RATIONALE FOR EVALUATION AND TREATMENT: Rehabilitation  ONSET DATE: PD diagnosis in January 2017, worsening symptoms since April/May 2025  NEXT MD VISIT: 11/25/23   SUBJECTIVE:  SUBJECTIVE STATEMENT: Pt reports she is doing well. Posterior shoulders still pretty sore  Pt accompanied by: self  PAIN: Are you having pain? No and Yes: NPRS scale: 0/10 currently, up to 9/10 in intensity Pain location: L leg  Pain description: sore, agony, tight  Aggravating factors: unpredictable, fatigue (worn out from effort of walking)  Relieving factors: stretching   PERTINENT HISTORY:  L common peroneal neuropathy (2021), headache, OA, RLS, h/o back pain  PRECAUTIONS: None  RED FLAGS: None  WEIGHT BEARING RESTRICTIONS: No  FALLS:  Has patient fallen in last 6 months? No  LIVING ENVIRONMENT: Lives with: lives with their spouse or sister (when husband away for work) Lives in: [place] with husband, condo with sister Stairs: Yes: External: 1 steps; none - at her home Has following equipment at home: Single point cane, Environmental consultant - 4 wheeled, shower chair, and Grab bars  OCCUPATION: Retired  PLOF: Independent with household mobility with device, Independent with community mobility with device, Needs assistance with homemaking, and Leisure: scrabble, 4-5 days/week to gym until ~1 month ago  PATIENT GOALS: To be able to walk w/o the cement feeling in my feet - a steady walk w/o shuffling.   OBJECTIVE: (objective measures completed at initial evaluation unless otherwise dated)  DIAGNOSTIC FINDINGS:  05/01/2022 - MRI cervical spine IMPRESSION:  1. Stable degenerative changes of the cervical spine with mild spinal canal stenosis at  C2-3, C3-4 and C5-6.  2. Multilevel neural foraminal narrowing, severe on the right at C5-6 and moderate on the right at C3-4 and C7-T1.   COGNITION: Overall cognitive status: Within functional limits for tasks assessed   SENSATION: WFL Numbness in L great toe (intermittent)  COORDINATION: Gross motor - mild slowing  EDEMA:  Not recently  MUSCLE TONE: LLE: Mild into L ankle inversion  POSTURE:  rounded shoulders, forward head, flexed trunk , and L ankle drawing into inversion  MUSCLE LENGTH: Hamstrings: Mild tight B ITB: Mild tight L>R Piriformis: Mod tight B Hip flexors: Mod/severe tight B Quads: Mild/mod tight B Heelcord: Mild/mod tight B  LOWER EXTREMITY ROM:    Grossly WFL other than restrictions as noted above in muscle length  LOWER EXTREMITY MMT:    MMT Right eval Left eval  Hip flexion 4 4  Hip extension 3- 3-  Hip abduction 3 3  Hip adduction 4- 4-  Hip internal rotation 4 4  Hip external rotation 3+ 3+  Knee flexion 4+ 4  Knee extension 4+ 4+  Ankle dorsiflexion 4 4-  Ankle plantarflexion 5 4+   Ankle inversion 4- 3+  Ankle eversion 4 4-  (Blank rows = not tested)  BED MOBILITY:  Sit to supine SBA Supine to sit SBA Rolling to Right SBA Rolling to Left SBA  TRANSFERS: Assistive device utilized: Environmental consultant - 4 wheeled and None  Sit to stand: Modified independence Stand to sit: Modified independence Chair to chair: Modified independence Floor: NT  GAIT: Distance walked: Clinic distances Assistive device utilized: Environmental consultant - 4 wheeled and None Level of assistance: Modified independence and SBA Gait pattern: step through pattern, decreased stride length, decreased hip/knee flexion- Right, decreased hip/knee flexion- Left, shuffling, and trunk flexed Comments: No freezing demonstrated during initial eval assessment however patient reports increasing episodes of freezing of gait recently  FUNCTIONAL TESTS:  5 times sit to stand: 10.79 sec w/o UE  assist Timed up and go (TUG): 9.63 sec with 4WW; 10.50 sec w/o AD (normal), 10.06 sec (manual), 12.66 sec (cognitive - shifted from  counting back by 3's to 2's mid test) 10 meter walk test: 14.00 sec with 4WW, 13.10 sec w/o AD Gait speed: 2.34 ft/sec with 4WW, 2.50 ft/sec w/o AD Functional gait assessment:   [facility] PT Assessment - 09/16/23 0001       Functional Gait  Assessment   Gait assessed  Yes    Gait Level Surface Walks 20 ft, slow speed, abnormal gait pattern, evidence for imbalance or deviates 10-15 in outside of the 12 in walkway width. Requires more than 7 sec to ambulate 20 ft.    Change in Gait Speed Able to change speed, demonstrates mild gait deviations, deviates 6-10 in outside of the 12 in walkway width, or no gait deviations, unable to achieve a major change in velocity, or uses a change in velocity, or uses an assistive device.    Gait with Horizontal Head Turns Performs head turns smoothly with slight change in gait velocity (eg, minor disruption to smooth gait path), deviates 6-10 in outside 12 in walkway width, or uses an assistive device.    Gait with Vertical Head Turns Performs task with slight change in gait velocity (eg, minor disruption to smooth gait path), deviates 6 - 10 in outside 12 in walkway width or uses assistive device    Gait and Pivot Turn Pivot turns safely within 3 sec and stops quickly with no loss of balance.    Step Over Obstacle Is able to step over one shoe box (4.5 in total height) without changing gait speed. No evidence of imbalance.    Gait with Narrow Base of Support Ambulates 7-9 steps.    Gait with Eyes Closed Walks 20 ft, uses assistive device, slower speed, mild gait deviations, deviates 6-10 in outside 12 in walkway width. Ambulates 20 ft in less than 9 sec but greater than 7 sec.    Ambulating Backwards Walks 20 ft, slow speed, abnormal gait pattern, evidence for imbalance, deviates 10-15 in outside 12 in walkway width.    Steps Alternating  feet, must use rail.    Total Score 19         FGA Interpretation of scores: Non-Specific Older Adults Cutoff Score: <=22/30 = risk of falls Parkinson's Disease Cutoff score <15/30 = fall risk (Hoehn & Yahr 1-4) Minimally Clinically Important Difference (MCID)  Stroke (acute, subacute, and chronic) = MDC: 4.2 points Vestibular (acute) = MDC: 6 points Community Dwelling Older Adults =  MCID: 4 points Parkinson's Disease  =  MDC: 4.3 points  PATIENT SURVEYS:  ABC scale: 480 / 1600 = 30.0 %, <69% indicates risk for recurrent falls in PD and <50% indicates a low level of physical functioning; 420 / 1600 = 26.3 % (09/30/23)  TODAY'S TREATMENT:  09/30/23 THERAPEUTIC EXERCISE: To improve strength, endurance, ROM, and flexibility.  Recumbent Bike L3x35min Seated hip abd Blue TB x 20 Seated LAQ 3lb x 20 BLE  NEUROMUSCULAR RE-EDUCATION: To improve coordination, kinesthesia, posture, and proprioception.  Step fwd and opp arm reach x 10 BLE Step fwd and scap retraction w/ weight shift x 10 Standing rows GTB 2 x 10 staggered stance Standing shoulder ext GTB 2x10 staggered stance  Bird dog in standing x 10 B Clock balance x 5 BLE 12 to 6 o'clock Step downs from airex to floor fwd and lateral x 10 B  09/26/23 Nustep L5x33min UE/LE Standing shoulder ext RTB x 20 Seated rows RTB x 20 Standing step and reach x 10 B Lateral step and reach x 10 B Reverse chop  RTB 2x10 B  Sit to stand x 20 with yellow weight ball + OHP  Seated LAQ 3lb x 20 BLE  09/23/23 Nustep L5x81min UE/LE Seated LAQ 3lb 2 x 10 BLE Stepping in four corners CW/CCW 3lb weights x 10 BLE Stepping over full foam roll 3lb weight x 10 B- LOB x 1, MinA for recovery Lateral stepping over foam roll 3lb weights x 10 BLE Sidesteps along counter YTB at ankles 3x down/back Review of ways to prevent freezing of gait checklist     PATIENT EDUCATION:  Education details: HEP updates- standing hip flexor stretch and added YTB to marching ;  freezing of gait checklist Person educated: Patient Education method: Explanation Education comprehension: verbalized understanding  HOME EXERCISE PROGRAM: Access Code: [email or web address] URL: [email or web address] Date: 09/23/2023 Prepared by: Sydell Prowell  Exercises - Seated Piriformis Stretch  - 2 x daily - 7 x weekly - 2 sets - 2 reps - 30 sec hold - Seated Piriformis Stretch  - 2 x daily - 7 x weekly - 2 sets - 2 reps - 30 sec hold - Standing Hip Abduction with Counter Support  - 1 x daily - 7 x weekly - 2 sets - 10 reps - Standing Hip Extension with Counter Support  - 1 x daily - 7 x weekly - 2 sets - 10 reps - Standing March with Counter Support  - 1 x daily - 7 x weekly - 2 sets - 10 reps - Standing Hip Flexor Stretch with Foot Elevated  - 2 x daily - 7 x weekly - 2 sets - 2 reps - 30 sec hold  Patient Education - Tips to reduce freezing episodes with standing or walking   ASSESSMENT:  CLINICAL IMPRESSION: Pt responded well to treatment. Continued with balance activities and working on her posture. Incorporated more weight shifting exercises and continued rotational movements to reduce rigidity. Some LOB with the clock balance exercise but she was able to self recover. She continues to benefit from skilled therapy to reduce risk for falls and improve confidence with balance.  Eval: LILY VELASQUEZ is a 74 y.o. female who was referred to physical therapy for evaluation and treatment for Parkinson's disease.  She was first diagnosed with Parkinson's in 2017.  She has completed 1 prior PT episode in 2017 within the [facility] system.  Her most recent PT episode for Parkinson's disease was Aug - Dec 2024 at [facility] in [place].  Since her last PT episode, she reports changes in her mobility with worsening of shuffling gait and freezing of gait, resulting in need for increased reliance on 4WW/rollator.  She also notes increased difficulty with bed mobility of late.  Patient  presents with physical impairments of decreased timing and coordination of gait, impaired ambulation, impaired standing balance, abnormal posture, bradykinesia with transfers, impaired activity tolerance, LE weakness, postural instability and decreased safety awareness impacting safe and independent functional mobility.  Examination revealed patient is at risk for falls and functional decline as evidenced by the following objective test measures: 5xSTS of 10.79 sec (>15 sec indicates increased risk for falls and decreased BLE power), Gait speed of 2.34 ft/sec with 4WW and 2.50 ft/sec w/o AD (2.62 ft/sec is needed for safe community access), TUG of 9.63 sec with 4WW and 10.50 sec w/o AD (>13.5 sec indicates increased risk for falls), TUG Manual of 10.06 sec (difference between TUG manual and TUG >4.5 seconds indicates increased fall risk), and TUG cognitive of 12.66 sec (>/= 15 seconds  indicates high risk for falls and community dwelling older adults).  TUG scores of >10% difference indicate difficulty with dual tasking.  Further testing of dynamic gait stability indicated and will be completed on next visit.  ABC scale score of 30% indicates a low level of physical functioning.  Arnisha will benefit from skilled PT to address above deficits to improve mobility and activity tolerance to help reach the maximal level of functional independence with mobility and gait with reduced risk for falls.  Patient demonstrates understanding of this POC and is in agreement with this plan.   OBJECTIVE IMPAIRMENTS: Abnormal gait, decreased activity tolerance, decreased balance, decreased coordination, decreased knowledge of condition, decreased knowledge of use of DME, decreased mobility, difficulty walking, decreased strength, impaired perceived functional ability, increased muscle spasms, impaired flexibility, improper body mechanics, postural dysfunction, and pain.   ACTIVITY LIMITATIONS: standing, sleeping, stairs, transfers,  bed mobility, bathing, locomotion level, and caring for others  PARTICIPATION LIMITATIONS: meal prep, cleaning, laundry, driving, shopping, and community activity  PERSONAL FACTORS: Fitness, Past/current experiences, Time since onset of injury/illness/exacerbation, and 3+ comorbidities: L common peroneal neuropathy (2021), headache, OA, RLS, h/o back pain are also affecting patient's functional outcome.   REHAB POTENTIAL: Good  CLINICAL DECISION MAKING: Unstable/unpredictable  EVALUATION COMPLEXITY: High   GOALS: Goals reviewed with patient? Yes  SHORT TERM GOALS: Target date: 10/21/2023  Patient will be independent with initial HEP. Baseline:  Goal status: MET- 09/19/23  2.  Patient will demonstrate improvement in overall LE muscle strength by at least 1/2 grade on MMT. Baseline: Refer to above MMT table Goal status: IN PROGRESS  3.  Patient will be educated on strategies to decrease risk of falls.  Baseline:  Goal status: MET- 09/19/23 education provided  4.  Patient will verbalize tips to reduce freezing/festination with gait and turns. Baseline:  Goal status: IN PROGRESS- 09/23/23 able to verbalize some of the tips  LONG TERM GOALS: Target date: 12/02/2023  Patient will be independent with ongoing/advanced HEP for self-management at home incorporating PWR! Moves as indicated .  Baseline:  Goal status: IN PROGRESS  2.  Patient will be able to ambulate 600' with or w/o LRAD with good safety and without freezing of gait to access community.  Baseline:  Goal status: IN PROGRESS  3.  Patient will be able to step up/down curb safely with or w/oLRAD for safety with community ambulation.  Baseline:  Goal status: IN PROGRESS   4.  Patient will demonstrate gait speed of >/= 2.62 ft/sec to be a safe limited community ambulator with decreased risk for recurrent falls.  Baseline: 2.34 ft/sec with 4WW, 2.50 ft/sec w/o AD Goal status: IN PROGRESS  5.  Patient will demonstrate at  least a 4 point improvement on FGA to improve gait stability and reduce risk for falls. (MCID = 4 points) Baseline: TBA Goal status: IN PROGRESS  6.  Patient will report >/= 43% on ABC scale to demonstrate improved balance confidence and decreased risk for falls. Baseline: 480 / 1600 = 30.0 % Goal status: IN PROGRESS  7. Patient will verbalize understanding of local Parkinson's disease community resources, including community fitness post d/c. Baseline:  Goal status: IN PROGRESS   PLAN:  PT FREQUENCY: 2x/week  PT DURATION: 12 weeks  PLANNED INTERVENTIONS: 97164- PT Re-evaluation, 97750- Physical Performance Testing, 97110-Therapeutic exercises, 97530- Therapeutic activity, [id number]- Neuromuscular re-education, 97535- Self Care, [id number]- Manual therapy, [id number]- Gait training, [phone]- Electrical stimulation (unattended), [id number]- Electrical stimulation (manual), [id number]- Ultrasound, [id number]- Ionotophoresis 4mg /ml Dexamethasone ,  [id number] (1-2 muscles), [id number] (3+ muscles)- Dry Needling, Patient/Family education, Balance training, Stair training, Taping, Joint mobilization, DME instructions, Cryotherapy, and Moist heat  PLAN FOR NEXT SESSION: work on improving foot clearance; postural strengthening; rotational movements   Sol LITTIE Gaskins, PTA 09/30/2023, 2:09 PM   Date of referral: 08/18/23 Referring provider: Evonnie Asberry RAMAN, DO Referring diagnosis? G20.A1 (ICD-10-CM) - Parkinson's disease without dyskinesia or fluctuating manifestations (HCC) Treatment diagnosis? (if different than referring diagnosis)  Other abnormalities of gait and mobility  Unsteadiness on feet  Muscle weakness (generalized)  What was this (referring dx) caused by? Ongoing Issue  Lysle of Condition: Chronic (continuous duration > 3 months)   Laterality: Both  Current Functional Measure Score: Other  420 / 1600 = 26.3 %  Objective measurements identify impairments when they are compared to normal values, the uninvolved  extremity, and prior level of function.  [x]  Yes  []  No  Objective assessment of functional ability: Moderate functional limitations   Briefly describe symptoms: Addilyne reports changes in her mobility with worsening of shuffling gait and freezing of gait since April/May, resulting in need for increased reliance on 4WW/rollator.  She also notes increased difficulty with bed mobility of late.  She presents with physical impairments of decreased timing and coordination of gait, impaired ambulation, impaired standing balance, abnormal posture, bradykinesia with transfers, impaired activity tolerance, LE weakness, postural instability and decreased safety awareness impacting safe and independent functional mobility.  Gait speeds of 2.34 ft/sec with 4WW and 2.50 ft/sec w/o AD indicate limited community ambulator (2.62 ft/sec is needed for safe community access).  Further testing of dynamic gait stability indicated and will be completed on next visit.  ABC scale score of 30% indicates a low level of physical functioning  How did symptoms start: PT first diagnosed in 2017 with current exacerbation of symptoms beginning in April/May of this year with increasing difficulty with mobility noted, in particular getting in and out of bed and increased shuffling and freezing of gait.  Average pain intensity:  Last 24 hours: 0/10  Past week: up to 2/10  How often does the pt experience symptoms? Occasionally  How much have the symptoms interfered with usual daily activities? Quite a bit  How has condition changed since care began at this facility? No change  In general, how is the patients overall health? Good  Onset date: PT first diagnosed in 2017 with current exacerbation of symptoms beginning in April/May 2025   BACK PAIN (STarT Back Screening Tool) - (When applicable): N/A  Has your back pain spread down your leg(s) at sometime in the last 2 weeks? []  Yes   []  No Have you had pain in the shoulder or  neck at sometime in the past 2 weeks? []  Yes   []  No Have you only walked short distances because of your back pain? []  Yes   []  No In the past 2 weeks, have you dressed more slowly than usual because of your back pain? []  Yes   []  No Do you think it is not really safe for person with a condition like yours to be physically active? []  Yes   []  No Have worrying thoughts been going through your mind a lot of the time? []  Yes   []  No Do you feel that your back pain is terrible and it is never going to get any better? []  Yes   []  No In general, have you stopped enjoying all the things you usually enjoy? []  Yes   []   No Overall, how bothersome has your back pain been in the last 2 weeks? []  Not at all   []  Slightly     []  Moderate   []  Very much     []  Extremely

## 2023-10-02 ENCOUNTER — Ambulatory Visit: Admitting: Physical Therapy

## 2023-10-07 ENCOUNTER — Encounter: Payer: Self-pay | Admitting: Physical Therapy

## 2023-10-07 ENCOUNTER — Ambulatory Visit: Admitting: Physical Therapy

## 2023-10-07 DIAGNOSIS — R2681 Unsteadiness on feet: Secondary | ICD-10-CM

## 2023-10-07 DIAGNOSIS — M6281 Muscle weakness (generalized): Secondary | ICD-10-CM

## 2023-10-07 DIAGNOSIS — R2689 Other abnormalities of gait and mobility: Secondary | ICD-10-CM | POA: Diagnosis not present

## 2023-10-07 NOTE — Therapy (Addendum)
 OUTPATIENT PHYSICAL THERAPY PARKINSON'S TREATMENT   Patient Name: [name] [name] MRN: [id number] DOB:[birth year], 73 y.o., female Today's Date: 10/07/2023   END OF SESSION:  PT End of Session - 10/07/23 1315     Visit Number 7   Date for PT Re-Evaluation 12/02/23    Authorization Type [employer]    Authorization Time Period auth pending    Authorization - Visit Number 1    Authorization - Number of Visits --    PT Start Time 1315    PT Stop Time 1400    PT Time Calculation (min) 45 min    Activity Tolerance Patient tolerated treatment well    Behavior During Therapy WFL for tasks assessed/performed              Past Medical History:  Diagnosis Date   Bursitis    Cellulitis    arm   Common peroneal neuropathy, left 04/27/2019   Headache    Hyperlipidemia    Insomnia    OA (osteoarthritis)    Onychomycosis    Osteoarthritis    Parkinson disease (HCC) 03/23/2015   RLS (restless legs syndrome) 03/23/2015   Tenosynovitis    Past Surgical History:  Procedure Laterality Date   TUBAL LIGATION     Patient Active Problem List   Diagnosis Date Noted   Common peroneal neuropathy, left 04/27/2019   Upper airway cough syndrome 05/03/2015   RLS (restless legs syndrome) 03/23/2015   Parkinson disease (HCC) 03/23/2015   Fever 08/29/2011   Headache 08/29/2011   Knee pain, right 08/29/2011   Back pain 08/29/2011   Bacteremia due to Gram-positive bacteria 08/29/2011   Hepatitis 08/29/2011   Periorbital edema 08/29/2011   Facial rash 08/29/2011   Blurred vision, right eye 08/29/2011   Tremor 08/29/2011   Meningitis 08/29/2011   CHEST PAIN UNSPECIFIED 03/04/2007   PNEUMONIA, LEFT LOWER LOBE 03/03/2007   COUGH, CHRONIC 03/03/2007    PCP: Toribio Jerel MATSU, MD   REFERRING PROVIDER: Evonnie Asberry RAMAN, DO   REFERRING DIAG: G20.A1 (ICD-10-CM) - Parkinson's disease without dyskinesia or fluctuating manifestations (HCC)  THERAPY DIAG:  Other abnormalities of gait and  mobility  Unsteadiness on feet  Muscle weakness (generalized)  RATIONALE FOR EVALUATION AND TREATMENT: Rehabilitation  ONSET DATE: PD diagnosis in January 2017, worsening symptoms since April/May 2025  NEXT MD VISIT: 11/25/23   SUBJECTIVE:  SUBJECTIVE STATEMENT: Pt reports she is still not feeling her best today after illness the other day. She is still having soreness in the B shoulder/scapular region.   Pt accompanied by: self  PAIN: Are you having pain? No and Yes: NPRS scale: 0/10 currently, 5/10 (L posterior shoulder when moving it much   Pain location: L leg  Pain description: sore, agony, tight  Aggravating factors: unpredictable, fatigue (worn out from effort of walking)  Relieving factors: stretching   PERTINENT HISTORY:  L common peroneal neuropathy (2021), headache, OA, RLS, h/o back pain  PRECAUTIONS: None  RED FLAGS: None  WEIGHT BEARING RESTRICTIONS: No  FALLS:  Has patient fallen in last 6 months? No  LIVING ENVIRONMENT: Lives with: lives with their spouse or sister (when husband away for work) Lives in: [place] with husband, condo with sister Stairs: Yes: External: 1 steps; none - at her home Has following equipment at home: Single point cane, Environmental consultant - 4 wheeled, shower chair, and Grab bars  OCCUPATION: Retired  PLOF: Independent with household mobility with device, Independent with community mobility with device, Needs assistance with homemaking, and Leisure: scrabble, 4-5 days/week to gym until ~1 month ago  PATIENT GOALS: To be able to walk w/o the cement feeling in my feet - a steady walk w/o shuffling.   OBJECTIVE: (objective measures completed at initial evaluation unless otherwise dated)  DIAGNOSTIC FINDINGS:  05/01/2022 - MRI cervical  spine IMPRESSION:  1. Stable degenerative changes of the cervical spine with mild spinal canal stenosis at C2-3, C3-4 and C5-6.  2. Multilevel neural foraminal narrowing, severe on the right at C5-6 and moderate on the right at C3-4 and C7-T1.   COGNITION: Overall cognitive status: Within functional limits for tasks assessed   SENSATION: WFL Numbness in L great toe (intermittent)  COORDINATION: Gross motor - mild slowing  EDEMA:  Not recently  MUSCLE TONE: LLE: Mild into L ankle inversion  POSTURE:  rounded shoulders, forward head, flexed trunk , and L ankle drawing into inversion  MUSCLE LENGTH: Hamstrings: Mild tight B ITB: Mild tight L>R Piriformis: Mod tight B Hip flexors: Mod/severe tight B Quads: Mild/mod tight B Heelcord: Mild/mod tight B  LOWER EXTREMITY ROM:    Grossly WFL other than restrictions as noted above in muscle length  LOWER EXTREMITY MMT:    MMT Right eval Left eval R 10/07/23 L 10/07/23  Hip flexion 4 4 4- 4  Hip extension 3- 3- 3+ 3+  Hip abduction 3 3 3+ 3+  Hip adduction 4- 4- 4- 3+  Hip internal rotation 4 4 4+ 4+  Hip external rotation 3+ 3+ 4 4  Knee flexion 4+ 4 4+ 4+  Knee extension 4+ 4+ 5 5  Ankle dorsiflexion 4 4- 4- 4+  Ankle plantarflexion 5 4+  5 4+  Ankle inversion 4- 3+ 4 4  Ankle eversion 4 4- 4 4  (Blank rows = not tested)  BED MOBILITY:  Sit to supine SBA Supine to sit SBA Rolling to Right SBA Rolling to Left SBA  TRANSFERS: Assistive device utilized: Environmental consultant - 4 wheeled and None  Sit to stand: Modified independence Stand to sit: Modified independence+ Chair to chair: Modified independence Floor: NT  GAIT: Distance walked: Clinic distances Assistive device utilized: Environmental consultant - 4 wheeled and None Level of assistance: Modified independence and SBA Gait pattern: step through pattern, decreased stride length, decreased hip/knee flexion- Right, decreased hip/knee flexion- Left, shuffling, and trunk flexed Comments: No  freezing demonstrated during initial eval  assessment however patient reports increasing episodes of freezing of gait recently  FUNCTIONAL TESTS:  5 times sit to stand: 10.79 sec w/o UE assist Timed up and go (TUG): 9.63 sec with 4WW; 10.50 sec w/o AD (normal), 10.06 sec (manual), 12.66 sec (cognitive - shifted from counting back by 3's to 2's mid test) 10 meter walk test: 14.00 sec with 4WW, 13.10 sec w/o AD Gait speed: 2.34 ft/sec with 4WW, 2.50 ft/sec w/o AD Functional gait assessment:   [facility] PT Assessment - 09/16/23 0001       Functional Gait  Assessment   Gait assessed  Yes    Gait Level Surface Walks 20 ft, slow speed, abnormal gait pattern, evidence for imbalance or deviates 10-15 in outside of the 12 in walkway width. Requires more than 7 sec to ambulate 20 ft.    Change in Gait Speed Able to change speed, demonstrates mild gait deviations, deviates 6-10 in outside of the 12 in walkway width, or no gait deviations, unable to achieve a major change in velocity, or uses a change in velocity, or uses an assistive device.    Gait with Horizontal Head Turns Performs head turns smoothly with slight change in gait velocity (eg, minor disruption to smooth gait path), deviates 6-10 in outside 12 in walkway width, or uses an assistive device.    Gait with Vertical Head Turns Performs task with slight change in gait velocity (eg, minor disruption to smooth gait path), deviates 6 - 10 in outside 12 in walkway width or uses assistive device    Gait and Pivot Turn Pivot turns safely within 3 sec and stops quickly with no loss of balance.    Step Over Obstacle Is able to step over one shoe box (4.5 in total height) without changing gait speed. No evidence of imbalance.    Gait with Narrow Base of Support Ambulates 7-9 steps.    Gait with Eyes Closed Walks 20 ft, uses assistive device, slower speed, mild gait deviations, deviates 6-10 in outside 12 in walkway width. Ambulates 20 ft in less than 9 sec but  greater than 7 sec.    Ambulating Backwards Walks 20 ft, slow speed, abnormal gait pattern, evidence for imbalance, deviates 10-15 in outside 12 in walkway width.    Steps Alternating feet, must use rail.    Total Score 19         FGA Interpretation of scores: Non-Specific Older Adults Cutoff Score: <=22/30 = risk of falls Parkinson's Disease Cutoff score <15/30 = fall risk (Hoehn & Yahr 1-4) Minimally Clinically Important Difference (MCID)  Stroke (acute, subacute, and chronic) = MDC: 4.2 points Vestibular (acute) = MDC: 6 points Community Dwelling Older Adults =  MCID: 4 points Parkinson's Disease  =  MDC: 4.3 points  PATIENT SURVEYS:  ABC scale: 480 / 1600 = 30.0 %, <69% indicates risk for recurrent falls in PD and <50% indicates a low level of physical functioning; 420 / 1600 = 26.3 % (09/30/23)  TODAY'S TREATMENT:  10/07/23 THERAPEUTIC EXERCISE: To improve strength, endurance, ROM, and flexibility.  Rec Bike L3x6 min THERAPEUTIC ACTIVITIES: To improve functional performance.  Demonstration, verbal and tactile cues throughout for technique.  LE MMT Goal Assessment for re-auth  Gait for 600 ft w/o SPC- noted gradual decrease in L step length after ~350 ft  MANUAL THERAPY: To promote normalized muscle tension and reduced pain utilizing therapeutic massage, manual TP therapy, and myofascial release.  STM, TrP release to L subscapularis (3 min)  SELF  CARE: Provided education on PT POC progression, to reduce fall risk, and to prevent future decline in function. Provided education on community resources related to Parkinson's including support group and educational sessions, community-based PWR! Moves and exercise/movement groups as well as on line resources related to PD.   09/30/23 THERAPEUTIC EXERCISE: To improve strength, endurance, ROM, and flexibility.  Recumbent Bike L3x51min Seated hip abd Blue TB x 20 Seated LAQ 3lb x 20 BLE  NEUROMUSCULAR RE-EDUCATION: To improve coordination,  kinesthesia, posture, and proprioception.  Step fwd and opp arm reach x 10 BLE Step fwd and scap retraction w/ weight shift x 10 Standing rows GTB 2 x 10 staggered stance Standing shoulder ext GTB 2x10 staggered stance  Bird dog in standing x 10 B Clock balance x 5 BLE 12 to 6 o'clock Step downs from airex to floor fwd and lateral x 10 B  09/26/23 Nustep L5x83min UE/LE Standing shoulder ext RTB x 20 Seated rows RTB x 20 Standing step and reach x 10 B Lateral step and reach x 10 B Reverse chop RTB 2x10 B  Sit to stand x 20 with yellow weight ball + OHP  Seated LAQ 3lb x 20 BLE  09/23/23 Nustep L5x39min UE/LE Seated LAQ 3lb 2 x 10 BLE Stepping in four corners CW/CCW 3lb weights x 10 BLE Stepping over full foam roll 3lb weight x 10 B- LOB x 1, MinA for recovery Lateral stepping over foam roll 3lb weights x 10 BLE Sidesteps along counter YTB at ankles 3x down/back Review of ways to prevent freezing of gait checklist     PATIENT EDUCATION:  Education details: HEP updates- standing hip flexor stretch and added YTB to marching ; freezing of gait checklist Person educated: Patient Education method: Explanation Education comprehension: verbalized understanding  HOME EXERCISE PROGRAM: Access Code: [email or web address] URL: [email or web address] Date: 09/23/2023 Prepared by: Braylin Clark  Exercises - Seated Piriformis Stretch  - 2 x daily - 7 x weekly - 2 sets - 2 reps - 30 sec hold - Seated Piriformis Stretch  - 2 x daily - 7 x weekly - 2 sets - 2 reps - 30 sec hold - Standing Hip Abduction with Counter Support  - 1 x daily - 7 x weekly - 2 sets - 10 reps - Standing Hip Extension with Counter Support  - 1 x daily - 7 x weekly - 2 sets - 10 reps - Standing March with Counter Support  - 1 x daily - 7 x weekly - 2 sets - 10 reps - Standing Hip Flexor Stretch with Foot Elevated  - 2 x daily - 7 x weekly - 2 sets - 2 reps - 30 sec hold  Patient Education - Tips to reduce freezing  episodes with standing or walking   ASSESSMENT:  CLINICAL IMPRESSION: [name] [name] reports that she has been feeling more of her shoulder aches for the last few days. She was ill a few days ago and is still not feeling her best. Pt has remaining deficits in strength and balance which are impacting ADLs more now since she is reporting inc'd amounts of freezing episodes now in comparison to when she first began therapy. [name] [name] was able to meet all but one of her STGs today. Pt is still having LE weakness in most movement especially hip ext and ABD. Pt was able to ambulate 600 ft today w/o an AD, noted shortening of her steps on the L side the more she walked. Pt did maintain tall, upright posture, good  arm swing while walking. When talking with PT/SPT, pt needed to slow down walking or completely stop walking to hold conversation which implies some difficulty with multitasking during gait which could result in future falls. Pt communicated that she would like to start back going to the gym or doing classes of some sort to stay active. PT provided resources regarding local options for those with Parkinson's for the patient to potentially begin.  Discussed intentions to introduce patient to [facility] Moves in her future visits with us  as well as we have not been able to share those with her yet. [name] [name] has not achieved several of her LTGs at this point of therapy and will continue to benefit from skill PT intervention to address above deficits to reduce fall risk, improve mobility, balance, and function.   Eval: [name] [name] is a 73 y.o. female who was referred to physical therapy for evaluation and treatment for Parkinson's disease.  She was first diagnosed with Parkinson's in 2017.  She has completed 1 prior PT episode in 2017 within the [facility] system.  Her most recent PT episode for Parkinson's disease was Aug - Dec 2024 at [facility] in [place].  Since her last PT episode, she reports changes in her mobility  with worsening of shuffling gait and freezing of gait, resulting in need for increased reliance on 4WW/rollator.  She also notes increased difficulty with bed mobility of late.  Patient presents with physical impairments of decreased timing and coordination of gait, impaired ambulation, impaired standing balance, abnormal posture, bradykinesia with transfers, impaired activity tolerance, LE weakness, postural instability and decreased safety awareness impacting safe and independent functional mobility.  Examination revealed patient is at risk for falls and functional decline as evidenced by the following objective test measures: 5xSTS of 10.79 sec (>15 sec indicates increased risk for falls and decreased BLE power), Gait speed of 2.34 ft/sec with 4WW and 2.50 ft/sec w/o AD (2.62 ft/sec is needed for safe community access), TUG of 9.63 sec with 4WW and 10.50 sec w/o AD (>13.5 sec indicates increased risk for falls), TUG Manual of 10.06 sec (difference between TUG manual and TUG >4.5 seconds indicates increased fall risk), and TUG cognitive of 12.66 sec (>/= 15 seconds indicates high risk for falls and community dwelling older adults).  TUG scores of >10% difference indicate difficulty with dual tasking.  Further testing of dynamic gait stability indicated and will be completed on next visit.  ABC scale score of 30% indicates a low level of physical functioning.  [name] [name] will benefit from skilled PT to address above deficits to improve mobility and activity tolerance to help reach the maximal level of functional independence with mobility and gait with reduced risk for falls.  Patient demonstrates understanding of this POC and is in agreement with this plan.   OBJECTIVE IMPAIRMENTS: Abnormal gait, decreased activity tolerance, decreased balance, decreased coordination, decreased knowledge of condition, decreased knowledge of use of DME, decreased mobility, difficulty walking, decreased strength, impaired perceived  functional ability, increased muscle spasms, impaired flexibility, improper body mechanics, postural dysfunction, and pain.   ACTIVITY LIMITATIONS: standing, sleeping, stairs, transfers, bed mobility, bathing, locomotion level, and caring for others  PARTICIPATION LIMITATIONS: meal prep, cleaning, laundry, driving, shopping, and community activity  PERSONAL FACTORS: Fitness, Past/current experiences, Time since onset of injury/illness/exacerbation, and 3+ comorbidities: L common peroneal neuropathy (2021), headache, OA, RLS, h/o back pain are also affecting patient's functional outcome.   REHAB POTENTIAL: Good  CLINICAL DECISION MAKING: Unstable/unpredictable  EVALUATION  COMPLEXITY: High   GOALS: Goals reviewed with patient? Yes  SHORT TERM GOALS: Target date: 10/21/2023  Patient will be independent with initial HEP. Baseline:  Goal status: MET- 09/19/23  2.  Patient will demonstrate improvement in overall LE muscle strength by at least 1/2 grade on MMT. Baseline: Refer to above MMT table Goal status: IN PROGRESS- 10/07/23- MMT table above  3.  Patient will be educated on strategies to decrease risk of falls.  Baseline:  Goal status: MET- 09/19/23 education provided  4.  Patient will verbalize tips to reduce freezing/festination with gait and turns. Baseline:  09/23/23 able to verbalize some of the tips Goal status: MET- 10/07/23- pt is able to discuss how to overcome freezing episodes and states they have helped  LONG TERM GOALS: Target date: 12/02/2023  Patient will be independent with ongoing/advanced HEP for self-management at home incorporating PWR! Moves as indicated .  Baseline:  Goal status: IN PROGRESS  2.  Patient will be able to ambulate 600' with or w/o LRAD with good safety and without freezing of gait to access community.  Baseline:  Goal status: PARTIALLY MET- 10/07/23- pt walked w/o cane, noted slightly shorter steps at times on LLE, slowed down w/ walking and  talking potentially decreasing safety awareness  3.  Patient will be able to step up/down curb safely with or w/oLRAD for safety with community ambulation.  Baseline:  Goal status: IN PROGRESS   4.  Patient will demonstrate gait speed of >/= 2.62 ft/sec to be a safe limited community ambulator with decreased risk for recurrent falls.  Baseline: 2.34 ft/sec with 4WW, 2.50 ft/sec w/o AD Goal status: IN PROGRESS  5.  Patient will demonstrate at least a 4 point improvement on FGA to improve gait stability and reduce risk for falls. (MCID = 4 points) Baseline: TBA Goal status: IN PROGRESS  6.  Patient will report >/= 43% on ABC scale to demonstrate improved balance confidence and decreased risk for falls. Baseline: 480 / 1600 = 30.0 % Goal status: IN PROGRESS  7. Patient will verbalize understanding of local Parkinson's disease community resources, including community fitness post d/c. Baseline:  Goal status: IN PROGRESS-10/07/23- information on resources provided to patient today, handouts given on exercise classes for PD    PLAN:  PT FREQUENCY: 2x/week  PT DURATION: 12 weeks  PLANNED INTERVENTIONS: 97164- PT Re-evaluation, 97750- Physical Performance Testing, 97110-Therapeutic exercises, 97530- Therapeutic activity, 97112- Neuromuscular re-education, 97535- Self Care, [id number]- Manual therapy, [phone]- Gait training, [phone]- Electrical stimulation (unattended), [phone]- Electrical stimulation (manual), [id number]- Ultrasound, [id number]- Ionotophoresis 4mg /ml Dexamethasone , 79439 (1-2 muscles), [id number] (3+ muscles)- Dry Needling, Patient/Family education, Balance training, Stair training, Taping, Joint mobilization, DME instructions, Cryotherapy, and Moist heat  PLAN FOR NEXT SESSION: introduction of PWR moves!; work on improving foot clearance; postural strengthening; rotational movements   Eusebio Saba, Student-PT 10/07/2023, 5:16 PM   Date of referral: 08/18/23 Referring provider: Evonnie Asberry RAMAN,  DO Referring diagnosis? G20.A1 (ICD-10-CM) - Parkinson's disease without dyskinesia or fluctuating manifestations (HCC) Treatment diagnosis? (if different than referring diagnosis)  Other abnormalities of gait and mobility  Unsteadiness on feet  Muscle weakness (generalized)  What was this (referring dx) caused by? Ongoing Issue  Lysle of Condition: Chronic (continuous duration > 3 months)   Laterality: Both  Current Functional Measure Score: ABC scale 420 / 1600 = 26.3 %  Objective measurements identify impairments when they are compared to normal values, the uninvolved extremity, and prior level of function.  [x]  Yes  []   No  Objective assessment of functional ability: Moderate functional limitations   Briefly describe symptoms: [name] [name] reports continued changes in her mobility w/ increased occurrence of freezing episode and shuffling gait since around May. She is still now using a single point cane when walking longer distances d/t decreased confidence, LE strength, and balance during gait. She currently has impairments in timing and balance during gait, impaired static and dynamic balance, bradykinesia with transfers, B LE weakness, postural instability, and dec'd safety awareness which is impacting her independent functional mobility. At this time, there is still a need for further assessment of dynamic gait and balance for fall risks (for both household and community settings) and is to be performed at upcoming visits. Pt's ABC score is currently 26.3% representing low level of physical functioning, which had declined w/ recent inc'd freezing episodes on a more frequent basis.    How did symptoms start: PT first diagnosed in 2017 with current exacerbation of symptoms beginning in April/May of this year with increasing difficulty with mobility noted, in particular getting in and out of bed and increased shuffling and freezing of gait.  Average pain intensity:  Last 24 hours:  8/10  Past week: up to 8/10  How often does the pt experience symptoms? Frequently  How much have the symptoms interfered with usual daily activities? Quite a bit  How has condition changed since care began at this facility? A little better  In general, how is the patients overall health? Good  Onset date: PT first diagnosed in 2017 with current exacerbation of symptoms beginning in April/May 2025   BACK PAIN (STarT Back Screening Tool) - (When applicable): N/A  Has your back pain spread down your leg(s) at sometime in the last 2 weeks? []  Yes   []  No Have you had pain in the shoulder or neck at sometime in the past 2 weeks? []  Yes   []  No Have you only walked short distances because of your back pain? []  Yes   []  No In the past 2 weeks, have you dressed more slowly than usual because of your back pain? []  Yes   []  No Do you think it is not really safe for person with a condition like yours to be physically active? []  Yes   []  No Have worrying thoughts been going through your mind a lot of the time? []  Yes   []  No Do you feel that your back pain is terrible and it is never going to get any better? []  Yes   []  No In general, have you stopped enjoying all the things you usually enjoy? []  Yes   []  No Overall, how bothersome has your back pain been in the last 2 weeks? []  Not at all   []  Slightly     []  Moderate   []  Very much     []  Extremely

## 2023-10-08 ENCOUNTER — Encounter (HOSPITAL_COMMUNITY): Payer: Self-pay | Admitting: Emergency Medicine

## 2023-10-08 ENCOUNTER — Other Ambulatory Visit: Payer: Self-pay

## 2023-10-08 ENCOUNTER — Emergency Department (HOSPITAL_COMMUNITY)
Admission: EM | Admit: 2023-10-08 | Discharge: 2023-10-09 | Disposition: A | Discharge status: Home / Self Care | Attending: Emergency Medicine | Admitting: Emergency Medicine

## 2023-10-08 ENCOUNTER — Emergency Department (HOSPITAL_COMMUNITY)

## 2023-10-08 DIAGNOSIS — M62838 Other muscle spasm: Secondary | ICD-10-CM | POA: Diagnosis not present

## 2023-10-08 DIAGNOSIS — M62830 Muscle spasm of back: Secondary | ICD-10-CM

## 2023-10-08 DIAGNOSIS — M19012 Primary osteoarthritis, left shoulder: Secondary | ICD-10-CM | POA: Diagnosis not present

## 2023-10-08 DIAGNOSIS — M25512 Pain in left shoulder: Secondary | ICD-10-CM | POA: Diagnosis not present

## 2023-10-08 NOTE — ED Triage Notes (Signed)
 Pt presents to the ED via POV with complaints of L shoulder/joint pain x 2 days. Pt notes a hx of arthritis and joint pain and this pain has been minimally relieved after taking 200mg  of ibuprofen . She notes the pain begins at her shoulder and radiates to her armpit and occasionally her elbow. Denies CP, falls nor injury. A&Ox4 at this time.

## 2023-10-09 ENCOUNTER — Ambulatory Visit: Admitting: Physical Therapy

## 2023-10-09 ENCOUNTER — Encounter: Payer: Self-pay | Admitting: Physical Therapy

## 2023-10-09 DIAGNOSIS — M6281 Muscle weakness (generalized): Secondary | ICD-10-CM | POA: Diagnosis not present

## 2023-10-09 DIAGNOSIS — R2689 Other abnormalities of gait and mobility: Secondary | ICD-10-CM | POA: Diagnosis not present

## 2023-10-09 DIAGNOSIS — R2681 Unsteadiness on feet: Secondary | ICD-10-CM

## 2023-10-09 MED ORDER — LIDOCAINE 5 % EX PTCH
1.0000 | MEDICATED_PATCH | CUTANEOUS | Status: DC
Start: 2023-10-09 — End: 2023-10-09
  Administered 2023-10-09: 1 via TRANSDERMAL
  Filled 2023-10-09: qty 1

## 2023-10-09 NOTE — Therapy (Signed)
 OUTPATIENT PHYSICAL THERAPY PARKINSON'S TREATMENT   Patient Name: [name] [name] MRN: [id number] DOB:[birth year], 73 y.o., female Today's Date: 10/09/2023   END OF SESSION:  PT End of Session - 10/09/23 1400     Visit Number 8    Date for PT Re-Evaluation 12/02/23    Authorization Type [employer]    Authorization Time Period auth pending    Authorization - Visit Number 2    PT Start Time 1400    PT Stop Time 1452    PT Time Calculation (min) 52 min    Activity Tolerance Patient tolerated treatment well    Behavior During Therapy WFL for tasks assessed/performed               Past Medical History:  Diagnosis Date   Bursitis    Cellulitis    arm   Common peroneal neuropathy, left 04/27/2019   Headache    Hyperlipidemia    Insomnia    OA (osteoarthritis)    Onychomycosis    Osteoarthritis    Parkinson disease (HCC) 03/23/2015   RLS (restless legs syndrome) 03/23/2015   Tenosynovitis    Past Surgical History:  Procedure Laterality Date   TUBAL LIGATION     Patient Active Problem List   Diagnosis Date Noted   Common peroneal neuropathy, left 04/27/2019   Upper airway cough syndrome 05/03/2015   RLS (restless legs syndrome) 03/23/2015   Parkinson disease (HCC) 03/23/2015   Fever 08/29/2011   Headache 08/29/2011   Knee pain, right 08/29/2011   Back pain 08/29/2011   Bacteremia due to Gram-positive bacteria 08/29/2011   Hepatitis 08/29/2011   Periorbital edema 08/29/2011   Facial rash 08/29/2011   Blurred vision, right eye 08/29/2011   Tremor 08/29/2011   Meningitis 08/29/2011   CHEST PAIN UNSPECIFIED 03/04/2007   PNEUMONIA, LEFT LOWER LOBE 03/03/2007   COUGH, CHRONIC 03/03/2007    PCP: Toribio Jerel MATSU, MD   REFERRING PROVIDER: Evonnie Asberry RAMAN, DO   REFERRING DIAG: G20.A1 (ICD-10-CM) - Parkinson's disease without dyskinesia or fluctuating manifestations (HCC)  THERAPY DIAG:  Other abnormalities of gait and mobility  Unsteadiness on feet  Muscle  weakness (generalized)  RATIONALE FOR EVALUATION AND TREATMENT: Rehabilitation  ONSET DATE: PD diagnosis in January 2017, worsening symptoms since April/May 2025  NEXT MD VISIT: 11/25/23   SUBJECTIVE:                                                                                                                                                                                                         SUBJECTIVE  STATEMENT: Pt reports she ended having to go to the ED yesterday evening due to return of severe pain in L shoulder area.  She was told her pain was most likely arthritis or nerve irritation from her neck and was given a lidocaine  patch.  No pain currently.   Pt accompanied by: self  PAIN: Are you having pain? No and Yes: NPRS scale: 0/10 currently, up to 5/10 last night  Pain location: L posterior shoulder  Pain description: sore, agony, tight  Aggravating factors: unpredictable  Relieving factors: stretching, lidocaine  patch   PERTINENT HISTORY:  L common peroneal neuropathy (2021), headache, OA, RLS, h/o back pain  PRECAUTIONS: None  RED FLAGS: None  WEIGHT BEARING RESTRICTIONS: No  FALLS:  Has patient fallen in last 6 months? No  LIVING ENVIRONMENT: Lives with: lives with their spouse or sister (when husband away for work) Lives in: [place] with husband, condo with sister Stairs: Yes: External: 1 steps; none - at her home Has following equipment at home: Single point cane, Environmental consultant - 4 wheeled, shower chair, and Grab bars  OCCUPATION: Retired  PLOF: Independent with household mobility with device, Independent with community mobility with device, Needs assistance with homemaking, and Leisure: scrabble, 4-5 days/week to gym until ~1 month ago  PATIENT GOALS: To be able to walk w/o the cement feeling in my feet - a steady walk w/o shuffling.   OBJECTIVE: (objective measures completed at initial evaluation unless otherwise dated)  DIAGNOSTIC FINDINGS:  05/01/2022  - MRI cervical spine IMPRESSION:  1. Stable degenerative changes of the cervical spine with mild spinal canal stenosis at C2-3, C3-4 and C5-6.  2. Multilevel neural foraminal narrowing, severe on the right at C5-6 and moderate on the right at C3-4 and C7-T1.   COGNITION: Overall cognitive status: Within functional limits for tasks assessed   SENSATION: WFL Numbness in L great toe (intermittent)  COORDINATION: Gross motor - mild slowing  EDEMA:  Not recently  MUSCLE TONE: LLE: Mild into L ankle inversion  POSTURE:  rounded shoulders, forward head, flexed trunk , and L ankle drawing into inversion  MUSCLE LENGTH: Hamstrings: Mild tight B ITB: Mild tight L>R Piriformis: Mod tight B Hip flexors: Mod/severe tight B Quads: Mild/mod tight B Heelcord: Mild/mod tight B  LOWER EXTREMITY ROM:    Grossly WFL other than restrictions as noted above in muscle length  LOWER EXTREMITY MMT:    MMT Right eval Left eval R 10/07/23 L 10/07/23  Hip flexion 4 4 4- 4  Hip extension 3- 3- 3+ 3+  Hip abduction 3 3 3+ 3+  Hip adduction 4- 4- 4- 3+  Hip internal rotation 4 4 4+ 4+  Hip external rotation 3+ 3+ 4 4  Knee flexion 4+ 4 4+ 4+  Knee extension 4+ 4+ 5 5  Ankle dorsiflexion 4 4- 4- 4+  Ankle plantarflexion 5 4+  5 4+  Ankle inversion 4- 3+ 4 4  Ankle eversion 4 4- 4 4  (Blank rows = not tested)  BED MOBILITY:  Sit to supine SBA Supine to sit SBA Rolling to Right SBA Rolling to Left SBA  TRANSFERS: Assistive device utilized: Environmental consultant - 4 wheeled and None  Sit to stand: Modified independence Stand to sit: Modified independence+ Chair to chair: Modified independence Floor: NT  GAIT: Distance walked: Clinic distances Assistive device utilized: Environmental consultant - 4 wheeled and None Level of assistance: Modified independence and SBA Gait pattern: step through pattern, decreased stride length, decreased hip/knee flexion- Right, decreased hip/knee  flexion- Left, shuffling, and trunk  flexed Comments: No freezing demonstrated during initial eval assessment however patient reports increasing episodes of freezing of gait recently  FUNCTIONAL TESTS:  5 times sit to stand: 10.79 sec w/o UE assist Timed up and go (TUG): 9.63 sec with 4WW; 10.50 sec w/o AD (normal), 10.06 sec (manual), 12.66 sec (cognitive - shifted from counting back by 3's to 2's mid test) 10 meter walk test: 14.00 sec with 4WW, 13.10 sec w/o AD Gait speed: 2.34 ft/sec with 4WW, 2.50 ft/sec w/o AD Functional gait assessment: 19/30 (09/16/23) Functional Gait Assessment  Gait assessed  Yes   Gait Level Surface Walks 20 ft, slow speed, abnormal gait pattern, evidence for imbalance or deviates 10-15 in outside of the 12 in walkway width. Requires more than 7 sec to ambulate 20 ft.   Change in Gait Speed Able to change speed, demonstrates mild gait deviations, deviates 6-10 in outside of the 12 in walkway width, or no gait deviations, unable to achieve a major change in velocity, or uses a change in velocity, or uses an assistive device.   Gait with Horizontal Head Turns Performs head turns smoothly with slight change in gait velocity (eg, minor disruption to smooth gait path), deviates 6-10 in outside 12 in walkway width, or uses an assistive device.   Gait with Vertical Head Turns Performs task with slight change in gait velocity (eg, minor disruption to smooth gait path), deviates 6 - 10 in outside 12 in walkway width or uses assistive device   Gait and Pivot Turn Pivot turns safely within 3 sec and stops quickly with no loss of balance.   Step Over Obstacle Is able to step over one shoe box (4.5 in total height) without changing gait speed. No evidence of imbalance.   Gait with Narrow Base of Support Ambulates 7-9 steps.   Gait with Eyes Closed Walks 20 ft, uses assistive device, slower speed, mild gait deviations, deviates 6-10 in outside 12 in walkway width. Ambulates 20 ft in less than 9 sec but greater than 7 sec.    Ambulating Backwards Walks 20 ft, slow speed, abnormal gait pattern, evidence for imbalance, deviates 10-15 in outside 12 in walkway width.   Steps Alternating feet, must use rail.   Total Score 19      FGA Interpretation of scores: Non-Specific Older Adults Cutoff Score: <=22/30 = risk of falls Parkinson's Disease Cutoff score <15/30 = fall risk (Hoehn & Yahr 1-4) Minimally Clinically Important Difference (MCID)  Stroke (acute, subacute, and chronic) = MDC: 4.2 points Vestibular (acute) = MDC: 6 points Community Dwelling Older Adults =  MCID: 4 points Parkinson's Disease  =  MDC: 4.3 points  PATIENT SURVEYS:  ABC scale: 480 / 1600 = 30.0 %, <69% indicates risk for recurrent falls in PD and <50% indicates a low level of physical functioning; 420 / 1600 = 26.3 % (09/30/23)  TODAY'S TREATMENT:   10/09/23 THERAPEUTIC EXERCISE: To improve strength, endurance, ROM, and flexibility.  Demonstration, verbal and tactile cues throughout for technique.  NuStep - L4 x 6 min (UE/LE) S/L L bow & arrow open book stretch 10 x 5 Standing 3-way (60/90/120) doorway pec stretch 2 x 30 each position - best stretch felt at ~90 abduction Seated scap retraction with slight depression 10 x 5  MANUAL THERAPY: To promote normalized muscle tension, improved flexibility, pain modulation, and reduced pain utilizing connective tissue massage, therapeutic massage, and manual TP therapy. STM/DTM and manual TPR to L UT, LS,  rhomboids, subscapularis, teres group, lats and pecs - most TTP in subscapularis and pec minor but able to achieve good muscle relaxation and reduction in pain and muscle tension in all muscles addressed today. L scapular mobilization in R S/L   SELF CARE: Provided education to prevent loss of gains achieved with physical therapy and to prevent future decline in function.  Provided instruction in self-STM techniques to posterior shoulder complex using tennis ball on wall or Theracane.     10/07/23 THERAPEUTIC EXERCISE: To improve strength, endurance, ROM, and flexibility.  Rec Bike L3x6 min  THERAPEUTIC ACTIVITIES: To improve functional performance.  Demonstration, verbal and tactile cues throughout for technique.  LE MMT Goal Assessment for re-auth  Gait for 600 ft w/o SPC- noted gradual decrease in L step length after ~350 ft   MANUAL THERAPY: To promote normalized muscle tension and reduced pain utilizing therapeutic massage, manual TP therapy, and myofascial release.  STM, TrP release to L subscapularis (3 min)   SELF CARE: Provided education on PT POC progression, to reduce fall risk, and to prevent future decline in function. Provided education on community resources related to Parkinson's including support group and educational sessions, community-based PWR! Moves and exercise/movement groups as well as on line resources related to PD.    09/30/23 THERAPEUTIC EXERCISE: To improve strength, endurance, ROM, and flexibility.  Recumbent Bike L3x8min Seated hip abd Blue TB x 20 Seated LAQ 3lb x 20 BLE  NEUROMUSCULAR RE-EDUCATION: To improve coordination, kinesthesia, posture, and proprioception.  Step fwd and opp arm reach x 10 BLE Step fwd and scap retraction w/ weight shift x 10 Standing rows GTB 2 x 10 staggered stance Standing shoulder ext GTB 2x10 staggered stance  Bird dog in standing x 10 B Clock balance x 5 BLE 12 to 6 o'clock Step downs from airex to floor fwd and lateral x 10 B   09/26/23 Nustep L5x37min UE/LE Standing shoulder ext RTB x 20 Seated rows RTB x 20 Standing step and reach x 10 B Lateral step and reach x 10 B Reverse chop RTB 2x10 B  Sit to stand x 20 with yellow weight ball + OHP  Seated LAQ 3lb x 20 BLE   09/23/23 Nustep L5x61min UE/LE Seated LAQ 3lb 2 x 10 BLE Stepping in four corners CW/CCW 3lb weights x 10 BLE Stepping over full foam roll 3lb weight x 10 B- LOB x 1, MinA for recovery Lateral stepping over foam roll 3lb weights  x 10 BLE Sidesteps along counter YTB at ankles 3x down/back Review of ways to prevent freezing of gait checklist   PATIENT EDUCATION:  Education details: HEP update - postural stretching, postural awareness, and self-STM techniques to posterior shoulder complex using TheraCance  Person educated: Patient Education method: Explanation, Demonstration, Verbal cues, Tactile cues, and Handouts Education comprehension: verbalized understanding, returned demonstration, verbal cues required, tactile cues required, and needs further education   HOME EXERCISE PROGRAM: Access Code: [email or web address] URL: [email or web address] Date: 10/09/2023 Prepared by: Elijah Hidden  Exercises - Seated Piriformis Stretch  - 2 x daily - 7 x weekly - 2 sets - 2 reps - 30 sec hold - Seated Piriformis Stretch  - 2 x daily - 7 x weekly - 2 sets - 2 reps - 30 sec hold - Standing Hip Abduction with Counter Support  - 1 x daily - 7 x weekly - 2 sets - 10 reps - Standing Hip Extension with Counter Support  - 1 x daily - 7 x weekly -  2 sets - 10 reps - Standing March with Counter Support  - 1 x daily - 7 x weekly - 2 sets - 10 reps - Standing Hip Flexor Stretch with Foot Elevated  - 2 x daily - 7 x weekly - 2 sets - 2 reps - 30 sec hold - Sidelying Thoracic Rotation with Open Book  - 1 x daily - 7 x weekly - 10 reps - 5 sec hold - Doorway Pec Stretch at 90 Degrees Abduction  - 2 x daily - 7 x weekly - 3 reps - 30 sec hold - Seated Scapular Retraction  - 2 x daily - 7 x weekly - 2 sets - 10 reps - 5 sec hold - Theracane Over Shoulder  - 1-2 x daily - 7 x weekly - 1-2 min hold  Patient Education - Tips to reduce freezing episodes with standing or walking   ASSESSMENT:  CLINICAL IMPRESSION: Mimie reports she ended up in the ED last night due to increased pain in her L shoulder and periscapular area.  Pain was attributed to muscle spasm vs OA or cervical radiculopathy and pt was given a lidocaine  patch which she  was still wearing upon arrival to PT but was at the end of the 12-hr wear time so removed by PT.  Today's visit focusing on MT to address abnormal muscle tension and muscle spasms followed by gentle stretching and scapular strengthening to improve posture and promote further normalization of muscle tension.  HEP updated with most effective stretches and scapular activation exercises as well as instruction in self-STM techniques to posterior shoulder complex using TheraCane.  Given above, initiation of PWR! Moves deferred today but will hopefully be able to start next visit.  Shawnae will benefit from continued skilled PT to address ongoing muscle tension, strength and balance deficits to improve mobility and activity tolerance with decreased pain interference and decreased risk for falls.   EVAL: Joslin P Mcglaughlin is a 73 y.o. female who was referred to physical therapy for evaluation and treatment for Parkinson's disease.  She was first diagnosed with Parkinson's in 2017.  She has completed 1 prior PT episode in 2017 within the [facility] system.  Her most recent PT episode for Parkinson's disease was Aug - Dec 2024 at [facility] in [place].  Since her last PT episode, she reports changes in her mobility with worsening of shuffling gait and freezing of gait, resulting in need for increased reliance on 4WW/rollator.  She also notes increased difficulty with bed mobility of late.  Patient presents with physical impairments of decreased timing and coordination of gait, impaired ambulation, impaired standing balance, abnormal posture, bradykinesia with transfers, impaired activity tolerance, LE weakness, postural instability and decreased safety awareness impacting safe and independent functional mobility.  Examination revealed patient is at risk for falls and functional decline as evidenced by the following objective test measures: 5xSTS of 10.79 sec (>15 sec indicates increased risk for falls and decreased BLE  power), Gait speed of 2.34 ft/sec with 4WW and 2.50 ft/sec w/o AD (2.62 ft/sec is needed for safe community access), TUG of 9.63 sec with 4WW and 10.50 sec w/o AD (>13.5 sec indicates increased risk for falls), TUG Manual of 10.06 sec (difference between TUG manual and TUG >4.5 seconds indicates increased fall risk), and TUG cognitive of 12.66 sec (>/= 15 seconds indicates high risk for falls and community dwelling older adults).  TUG scores of >10% difference indicate difficulty with dual tasking.  Further testing of  dynamic gait stability indicated and will be completed on next visit.  ABC scale score of 30% indicates a low level of physical functioning.  Perla will benefit from skilled PT to address above deficits to improve mobility and activity tolerance to help reach the maximal level of functional independence with mobility and gait with reduced risk for falls.  Patient demonstrates understanding of this POC and is in agreement with this plan.   OBJECTIVE IMPAIRMENTS: Abnormal gait, decreased activity tolerance, decreased balance, decreased coordination, decreased knowledge of condition, decreased knowledge of use of DME, decreased mobility, difficulty walking, decreased strength, impaired perceived functional ability, increased muscle spasms, impaired flexibility, improper body mechanics, postural dysfunction, and pain.   ACTIVITY LIMITATIONS: standing, sleeping, stairs, transfers, bed mobility, bathing, locomotion level, and caring for others  PARTICIPATION LIMITATIONS: meal prep, cleaning, laundry, driving, shopping, and community activity  PERSONAL FACTORS: Fitness, Past/current experiences, Time since onset of injury/illness/exacerbation, and 3+ comorbidities: L common peroneal neuropathy (2021), headache, OA, RLS, h/o back pain are also affecting patient's functional outcome.   REHAB POTENTIAL: Good  CLINICAL DECISION MAKING: Unstable/unpredictable  EVALUATION COMPLEXITY:  High   GOALS: Goals reviewed with patient? Yes  SHORT TERM GOALS: Target date: 10/21/2023  Patient will be independent with initial HEP. Baseline:  Goal status: MET - 09/19/23  2.  Patient will demonstrate improvement in overall LE muscle strength by at least 1/2 grade on MMT. Baseline: Refer to above MMT table Goal status: IN PROGRESS - 10/07/23 - overall B LE strength improving   3.  Patient will be educated on strategies to decrease risk of falls.  Baseline:  Goal status: MET - 09/19/23 - education provided  4.  Patient will verbalize tips to reduce freezing/festination with gait and turns. Baseline:  09/23/23 able to verbalize some of the tips Goal status: MET - 10/07/23 - pt is able to discuss how to overcome freezing episodes and states they have helped  LONG TERM GOALS: Target date: 12/02/2023  Patient will be independent with ongoing/advanced HEP for self-management at home incorporating PWR! Moves as indicated .  Baseline:  Goal status: IN PROGRESS  2.  Patient will be able to ambulate 600' with or w/o LRAD with good safety and without freezing of gait to access community.  Baseline:  Goal status: PARTIALLY MET- 10/07/23- pt walked w/o cane, noted slightly shorter steps at times on LLE, slowed down w/ walking and talking potentially decreasing safety awareness  3.  Patient will be able to step up/down curb safely with or w/oLRAD for safety with community ambulation.  Baseline:  Goal status: IN PROGRESS   4.  Patient will demonstrate gait speed of >/= 2.62 ft/sec to be a safe limited community ambulator with decreased risk for recurrent falls.  Baseline: 2.34 ft/sec with 4WW, 2.50 ft/sec w/o AD Goal status: IN PROGRESS  5.  Patient will demonstrate at least a 4 point improvement on FGA to improve gait stability and reduce risk for falls. (MCID = 4 points) Baseline: TBA Goal status: IN PROGRESS  6.  Patient will report >/= 43% on ABC scale to demonstrate improved balance  confidence and decreased risk for falls. Baseline: 480 / 1600 = 30.0 % Goal status: IN PROGRESS  7. Patient will verbalize understanding of local Parkinson's disease community resources, including community fitness post d/c. Baseline:  Goal status: IN PROGRESS - 10/07/23 - information on resources provided to patient today, handouts given on exercise classes for PD    PLAN:  PT FREQUENCY: 2x/week  PT DURATION: 12 weeks  PLANNED INTERVENTIONS: 97164- PT Re-evaluation, 97750- Physical Performance Testing, 97110-Therapeutic exercises, 97530- Therapeutic activity, [id number]- Neuromuscular re-education, [phone]- Self Care, [id number]- Manual therapy, [phone]- Gait training, [phone]- Electrical stimulation (unattended), [phone]- Electrical stimulation (manual), [id number]- Ultrasound, [id number]- Ionotophoresis 4mg /ml Dexamethasone , [id number] (1-2 muscles), [id number] (3+ muscles)- Dry Needling, Patient/Family education, Balance training, Stair training, Taping, Joint mobilization, DME instructions, Cryotherapy, and Moist heat  PLAN FOR NEXT SESSION: further MT to L shoulder complex as needed; introduction of PWR Moves!; work on improving foot clearance with gait; postural strengthening; rotational movements   Elijah CHRISTELLA Hidden, PT 10/09/2023, 3:17 PM   Date of referral: 08/18/23 Referring provider: Evonnie Asberry RAMAN, DO Referring diagnosis? G20.A1 (ICD-10-CM) - Parkinson's disease without dyskinesia or fluctuating manifestations (HCC) Treatment diagnosis? (if different than referring diagnosis)  Other abnormalities of gait and mobility  Unsteadiness on feet  Muscle weakness (generalized)  What was this (referring dx) caused by? Ongoing Issue  Lysle of Condition: Chronic (continuous duration > 3 months)   Laterality: Both  Current Functional Measure Score: ABC scale 420 / 1600 = 26.3 %  Objective measurements identify impairments when they are compared to normal values, the uninvolved extremity, and prior level of  function.  [x]  Yes  []  No  Objective assessment of functional ability: Moderate functional limitations   Briefly describe symptoms: Marsheila reports continued changes in her mobility w/ increased occurrence of freezing episode and shuffling gait since around May. She is still now using a single point cane when walking longer distances d/t decreased confidence, LE strength, and balance during gait. She currently has impairments in timing and balance during gait, impaired static and dynamic balance, bradykinesia with transfers, B LE weakness, postural instability, and dec'd safety awareness which is impacting her independent functional mobility. At this time, there is still a need for further assessment of dynamic gait and balance for fall risks (for both household and community settings) and is to be performed at upcoming visits. Pt's ABC score is currently 26.3% representing low level of physical functioning, which had declined w/ recent inc'd freezing episodes on a more frequent basis.    How did symptoms start: PT first diagnosed in 2017 with current exacerbation of symptoms beginning in April/May of this year with increasing difficulty with mobility noted, in particular getting in and out of bed and increased shuffling and freezing of gait.  Average pain intensity:  Last 24 hours: 8/10  Past week: up to 8/10  How often does the pt experience symptoms? Frequently  How much have the symptoms interfered with usual daily activities? Quite a bit  How has condition changed since care began at this facility? A little better  In general, how is the patients overall health? Good  Onset date: PT first diagnosed in 2017 with current exacerbation of symptoms beginning in April/May 2025   BACK PAIN (STarT Back Screening Tool) - (When applicable): N/A  Has your back pain spread down your leg(s) at sometime in the last 2 weeks? []  Yes   []  No Have you had pain in the shoulder or neck at sometime in  the past 2 weeks? []  Yes   []  No Have you only walked short distances because of your back pain? []  Yes   []  No In the past 2 weeks, have you dressed more slowly than usual because of your back pain? []  Yes   []  No Do you think it is not really safe for person with a condition like yours to be  physically active? []  Yes   []  No Have worrying thoughts been going through your mind a lot of the time? []  Yes   []  No Do you feel that your back pain is terrible and it is never going to get any better? []  Yes   []  No In general, have you stopped enjoying all the things you usually enjoy? []  Yes   []  No Overall, how bothersome has your back pain been in the last 2 weeks? []  Not at all   []  Slightly     []  Moderate   []  Very much     []  Extremely

## 2023-10-09 NOTE — ED Provider Notes (Signed)
 [place] EMERGENCY DEPARTMENT AT [facility] Provider Note   CSN: [id number] Arrival date & time: 10/08/23  2132     Patient presents with: Joint Pain   [name] [name] is a 73 y.o. female.   73 year old female presents with complaint of pain in her left shoulder onset Monday (3 days ago) without injury.  Pain is intermittent, occurs about 4-5 times a day.  While she is having pain, the pain is worse with any movement of her left shoulder.  In between episodes, she is totally pain-free.  Pain is also reproduced with palpation through her left trapezius area.  Patient states that she has Parkinson's, has been going to physical therapy to work on her gait.  She mentioned the onset of pain in this area to her physical therapist on Tuesday.  Physical therapist applied pressure to the area patient reports some improvement in her pain following that treatment.  She denies chest pain, difficulty breathing or any other complaints or concerns currently.       Prior to Admission medications   Medication Sig Start Date End Date Taking? Authorizing Provider  albuterol (VENTOLIN HFA) 108 (90 Base) MCG/ACT inhaler Inhale 2 puffs into the lungs every 6 (six) hours as needed for wheezing or shortness of breath.    [provider]  Ascorbic Acid (VITAMIN C PO) Take by mouth.    [provider]  atorvastatin (LIPITOR) 20 MG tablet Take 20 mg by mouth daily.    [provider]  Calcium Carb-Cholecalciferol (CALCIUM 600 + D PO) Take 2 tablets by mouth daily.    [provider]  carbidopa -levodopa  (SINEMET  IR) 25-100 MG tablet 2 tablets at 7am/2 at 10am/2 at 1pm/1 at 4pm 08/18/23   Tat, Asberry RAMAN, DO  Cholecalciferol (VITAMIN D) 50 MCG (2000 UT) CAPS Take 1 each by mouth daily. Patient not taking: Reported on 09/09/2023    [provider]  ferrous sulfate 325 (65 FE) MG tablet Take 325 mg by mouth daily with breakfast.    [provider]   fexofenadine (ALLEGRA) 180 MG tablet Take 180 mg by mouth daily.    [provider]  FLUoxetine (PROZAC) 20 MG capsule Take 20 mg by mouth daily.    [provider]  Levodopa  (INBRIJA ) 42 MG CAPS Place 42 capsules into inhaler and inhale daily.  Remember that TWO capsules is ONE dosage (never inhale just one capsule).  You can inhale the capsules as needed up to 5 times per day, separated by 2 hour intervals. Patient not taking: Reported on 09/09/2023 09/02/23   Tat, Asberry RAMAN, DO  LINZESS 145 MCG CAPS capsule Take 145 mcg by mouth daily. 06/07/21   [provider]  Naproxen Sodium (ALEVE PO) Take by mouth as needed.    [provider]  pantoprazole  (PROTONIX ) 40 MG tablet Take 40 mg by mouth daily. 11/28/22   [provider]  rOPINIRole  (REQUIP ) 1 MG tablet Take 1 tablet (1 mg total) by mouth 3 (three) times daily. 07/28/23   Tat, Asberry RAMAN, DO  vitamin B-12 (CYANOCOBALAMIN) 1000 MCG tablet Take 1,000 mcg by mouth daily.    [provider]    Allergies: Iodine, Iohexol , Penicillins, Shellfish allergy , Caffeine, Gabapentin, Hydrocodone-acetaminophen , Sulfa drugs cross reactors, and Tramadol     Review of Systems Negative except as per HPI Updated Vital Signs BP (!) 181/105   Pulse 81   Temp 98.3 F (36.8 C)   Resp 16   Ht  5' 6 (1.676 m)   Wt 72.1 kg   SpO2 100%   BMI 25.66 kg/m   Physical Exam Vitals and nursing note reviewed.  Constitutional:      General: She is not in acute distress.    Appearance: She is well-developed. She is not diaphoretic.  HENT:     Head: Normocephalic and atraumatic.  Cardiovascular:     Pulses: Normal pulses.  Pulmonary:     Effort: Pulmonary effort is normal.  Musculoskeletal:        General: Tenderness present. No swelling, deformity or signs of injury.     Left shoulder: Normal.     Left elbow: Normal.     Cervical back: No tenderness, bony tenderness or crepitus. No pain with movement.      Thoracic back: Spasms and tenderness present. No bony tenderness.       Back:  Skin:    General: Skin is warm and dry.     Findings: No bruising, erythema or rash.  Neurological:     Mental Status: She is alert and oriented to person, place, and time.     Sensory: No sensory deficit.     Motor: No weakness.  Psychiatric:        Behavior: Behavior normal.     (all labs ordered are listed, but only abnormal results are displayed) Labs Reviewed - No data to display  EKG: None  Radiology: DG Shoulder Left Result Date: 10/08/2023 CLINICAL DATA:  Left shoulder pain for 2 days, no known injury, initial encounter EXAM: LEFT SHOULDER - 2+ VIEW COMPARISON:  None Available. FINDINGS: Degenerative changes of the acromioclavicular joint are seen. No acute fracture or dislocation is noted. No soft tissue abnormality is seen. IMPRESSION: Mild degenerative change without acute abnormality. Electronically Signed   By: Oneil Devonshire M.D.   On: 10/08/2023 23:12     Procedures   Medications Ordered in the ED  lidocaine  (LIDODERM ) 5 % 1 patch (has no administration in time range)                                    Medical Decision Making Amount and/or Complexity of Data Reviewed Radiology: ordered.   73 year old female presents with complaint of pain in her left shoulder as above.  Pain is reproduced with palpation through her left trapezius especially on the medial border of the left scapula.  She has normal range of motion of the left shoulder, elbow, sensation intact with strong radial pulse present.  X-ray of the left shoulder as ordered for myself is negative for acute bony abnormality , Agree with radiologist interpretation.  Patient is not having chest pain at this time.  Her pain seems to be clearly reproduced with palpation with suspected muscle spasm versus cervical radiculopathy.  Patient is scheduled to see her physical therapist tomorrow.  Plan is to apply lidocaine  patch tonight as  she states that the topical muscle spray brings her about 2 hours of relief, hopefully this will be more helpful for her.  Follow-up with her physical therapist tomorrow as currently scheduled and discuss further with her neurologist/PCP care team.  Advised patient if she develops changing in her symptoms, exertional symptoms or pain in her chest, she should return to the emergency room for further evaluation.     Final diagnoses:  Spasm of left trapezius muscle    ED Discharge Orders     None  Beverley Leita LABOR, PA-C 10/09/23 0115    Carita Senior, MD 10/09/23 [phone]

## 2023-10-09 NOTE — Discharge Instructions (Signed)
 Follow-up with your physical therapist, neurologist, PCP as discussed. If the lidocaine  patch is helpful, these are available over-the-counter.  Return to the ER for any worsening or concerning symptoms.

## 2023-10-10 ENCOUNTER — Telehealth: Payer: Self-pay | Admitting: Neurology

## 2023-10-10 DIAGNOSIS — M542 Cervicalgia: Secondary | ICD-10-CM | POA: Diagnosis not present

## 2023-10-10 DIAGNOSIS — Z6824 Body mass index (BMI) 24.0-24.9, adult: Secondary | ICD-10-CM | POA: Diagnosis not present

## 2023-10-10 NOTE — Telephone Encounter (Signed)
 Pt called in and left a message wanting to alert Dr. Evonnie that she had to go to the ED on 10/08/23. They told her it was arthritis and to let our office know.

## 2023-10-16 ENCOUNTER — Ambulatory Visit: Admitting: Physical Therapy

## 2023-10-16 ENCOUNTER — Encounter: Payer: Self-pay | Admitting: Physical Therapy

## 2023-10-16 DIAGNOSIS — R2681 Unsteadiness on feet: Secondary | ICD-10-CM | POA: Diagnosis not present

## 2023-10-16 DIAGNOSIS — R2689 Other abnormalities of gait and mobility: Secondary | ICD-10-CM | POA: Diagnosis not present

## 2023-10-16 DIAGNOSIS — M6281 Muscle weakness (generalized): Secondary | ICD-10-CM

## 2023-10-16 NOTE — Therapy (Signed)
 OUTPATIENT PHYSICAL THERAPY PARKINSON'S TREATMENT   Patient Name: [name] [name] MRN: [id number] DOB:Jul 07, 1950, 73 y.o., female Today's Date: 10/16/2023   END OF SESSION:  PT End of Session - 10/16/23 1151     Visit Number 9    Date for PT Re-Evaluation 12/02/23    Authorization Type [employer]    Authorization Time Period 10/07/23 - 11/25/23    Authorization - Visit Number 3    Authorization - Number of Visits 14    PT Start Time 1151   Pt arrived late   PT Stop Time 1235    PT Time Calculation (min) 44 min    Activity Tolerance Patient tolerated treatment well    Behavior During Therapy [facility] for tasks assessed/performed                Past Medical History:  Diagnosis Date   Bursitis    Cellulitis    arm   Common peroneal neuropathy, left 04/27/2019   Headache    Hyperlipidemia    Insomnia    OA (osteoarthritis)    Onychomycosis    Osteoarthritis    Parkinson disease (HCC) 03/23/2015   RLS (restless legs syndrome) 03/23/2015   Tenosynovitis    Past Surgical History:  Procedure Laterality Date   TUBAL LIGATION     Patient Active Problem List   Diagnosis Date Noted   Common peroneal neuropathy, left 04/27/2019   Upper airway cough syndrome 05/03/2015   RLS (restless legs syndrome) 03/23/2015   Parkinson disease (HCC) 03/23/2015   Fever 08/29/2011   Headache 08/29/2011   Knee pain, right 08/29/2011   Back pain 08/29/2011   Bacteremia due to Gram-positive bacteria 08/29/2011   Hepatitis 08/29/2011   Periorbital edema 08/29/2011   Facial rash 08/29/2011   Blurred vision, right eye 08/29/2011   Tremor 08/29/2011   Meningitis 08/29/2011   CHEST PAIN UNSPECIFIED 03/04/2007   PNEUMONIA, LEFT LOWER LOBE 03/03/2007   COUGH, CHRONIC 03/03/2007    PCP: Toribio Jerel MATSU, MD   REFERRING PROVIDER: Evonnie Asberry RAMAN, DO   REFERRING DIAG: G20.A1 (ICD-10-CM) - Parkinson's disease without dyskinesia or fluctuating manifestations (HCC)  THERAPY DIAG:  Other  abnormalities of gait and mobility  Unsteadiness on feet  Muscle weakness (generalized)  RATIONALE FOR EVALUATION AND TREATMENT: Rehabilitation  ONSET DATE: PD diagnosis in January 2017, worsening symptoms since April/May 2025  NEXT MD VISIT: 11/25/23   SUBJECTIVE:  SUBJECTIVE STATEMENT: Pt reports she went to her PCP regarding her recent posterior shoulder pain and received an injection which has really helped.   Pt accompanied by: self  PAIN: Are you having pain? No and Yes: NPRS scale: 0/10 currently, up to 5/10 last night  Pain location: L posterior shoulder  Pain description: sore, agony, tight  Aggravating factors: unpredictable  Relieving factors: stretching, lidocaine  patch   PERTINENT HISTORY:  L common peroneal neuropathy (2021), headache, OA, RLS, h/o back pain  PRECAUTIONS: None  RED FLAGS: None  WEIGHT BEARING RESTRICTIONS: No  FALLS:  Has patient fallen in last 6 months? No  LIVING ENVIRONMENT: Lives with: lives with their spouse or sister (when husband away for work) Lives in: [place] with husband, condo with sister Stairs: Yes: External: 1 steps; none - at her home Has following equipment at home: Single point cane, Environmental consultant - 4 wheeled, shower chair, and Grab bars  OCCUPATION: Retired  PLOF: Independent with household mobility with device, Independent with community mobility with device, Needs assistance with homemaking, and Leisure: scrabble, 4-5 days/week to gym until ~1 month ago  PATIENT GOALS: To be able to walk w/o the cement feeling in my feet - a steady walk w/o shuffling.   OBJECTIVE: (objective measures completed at initial evaluation unless otherwise dated)  DIAGNOSTIC FINDINGS:  05/01/2022 - MRI cervical spine IMPRESSION:  1. Stable  degenerative changes of the cervical spine with mild spinal canal stenosis at C2-3, C3-4 and C5-6.  2. Multilevel neural foraminal narrowing, severe on the right at C5-6 and moderate on the right at C3-4 and C7-T1.   COGNITION: Overall cognitive status: Within functional limits for tasks assessed   SENSATION: WFL Numbness in L great toe (intermittent)  COORDINATION: Gross motor - mild slowing  EDEMA:  Not recently  MUSCLE TONE: LLE: Mild into L ankle inversion  POSTURE:  rounded shoulders, forward head, flexed trunk , and L ankle drawing into inversion  MUSCLE LENGTH: Hamstrings: Mild tight B ITB: Mild tight L>R Piriformis: Mod tight B Hip flexors: Mod/severe tight B Quads: Mild/mod tight B Heelcord: Mild/mod tight B  LOWER EXTREMITY ROM:    Grossly WFL other than restrictions as noted above in muscle length  LOWER EXTREMITY MMT:    MMT Right eval Left eval R 10/07/23 L 10/07/23  Hip flexion 4 4 4- 4  Hip extension 3- 3- 3+ 3+  Hip abduction 3 3 3+ 3+  Hip adduction 4- 4- 4- 3+  Hip internal rotation 4 4 4+ 4+  Hip external rotation 3+ 3+ 4 4  Knee flexion 4+ 4 4+ 4+  Knee extension 4+ 4+ 5 5  Ankle dorsiflexion 4 4- 4- 4+  Ankle plantarflexion 5 4+  5 4+  Ankle inversion 4- 3+ 4 4  Ankle eversion 4 4- 4 4  (Blank rows = not tested)  BED MOBILITY:  Sit to supine SBA Supine to sit SBA Rolling to Right SBA Rolling to Left SBA  TRANSFERS: Assistive device utilized: Environmental consultant - 4 wheeled and None  Sit to stand: Modified independence Stand to sit: Modified independence+ Chair to chair: Modified independence Floor: NT  GAIT: Distance walked: Clinic distances Assistive device utilized: Environmental consultant - 4 wheeled and None Level of assistance: Modified independence and SBA Gait pattern: step through pattern, decreased stride length, decreased hip/knee flexion- Right, decreased hip/knee flexion- Left, shuffling, and trunk flexed Comments: No freezing demonstrated during  initial eval assessment however patient reports increasing episodes of freezing of gait recently  FUNCTIONAL TESTS:  5 times sit to stand: 10.79 sec w/o UE assist Timed up and go (TUG): 9.63 sec with 4WW; 10.50 sec w/o AD (normal), 10.06 sec (manual), 12.66 sec (cognitive - shifted from counting back by 3's to 2's mid test) 10 meter walk test: 14.00 sec with 4WW, 13.10 sec w/o AD Gait speed: 2.34 ft/sec with 4WW, 2.50 ft/sec w/o AD Functional gait assessment: 19/30 (09/16/23) Functional Gait Assessment  Gait assessed  Yes   Gait Level Surface Walks 20 ft, slow speed, abnormal gait pattern, evidence for imbalance or deviates 10-15 in outside of the 12 in walkway width. Requires more than 7 sec to ambulate 20 ft.   Change in Gait Speed Able to change speed, demonstrates mild gait deviations, deviates 6-10 in outside of the 12 in walkway width, or no gait deviations, unable to achieve a major change in velocity, or uses a change in velocity, or uses an assistive device.   Gait with Horizontal Head Turns Performs head turns smoothly with slight change in gait velocity (eg, minor disruption to smooth gait path), deviates 6-10 in outside 12 in walkway width, or uses an assistive device.   Gait with Vertical Head Turns Performs task with slight change in gait velocity (eg, minor disruption to smooth gait path), deviates 6 - 10 in outside 12 in walkway width or uses assistive device   Gait and Pivot Turn Pivot turns safely within 3 sec and stops quickly with no loss of balance.   Step Over Obstacle Is able to step over one shoe box (4.5 in total height) without changing gait speed. No evidence of imbalance.   Gait with Narrow Base of Support Ambulates 7-9 steps.   Gait with Eyes Closed Walks 20 ft, uses assistive device, slower speed, mild gait deviations, deviates 6-10 in outside 12 in walkway width. Ambulates 20 ft in less than 9 sec but greater than 7 sec.   Ambulating Backwards Walks 20 ft, slow speed,  abnormal gait pattern, evidence for imbalance, deviates 10-15 in outside 12 in walkway width.   Steps Alternating feet, must use rail.   Total Score 19      FGA Interpretation of scores: Non-Specific Older Adults Cutoff Score: <=22/30 = risk of falls Parkinson's Disease Cutoff score <15/30 = fall risk (Hoehn & Yahr 1-4) Minimally Clinically Important Difference (MCID)  Stroke (acute, subacute, and chronic) = MDC: 4.2 points Vestibular (acute) = MDC: 6 points Community Dwelling Older Adults =  MCID: 4 points Parkinson's Disease  =  MDC: 4.3 points  PATIENT SURVEYS:  ABC scale: 480 / 1600 = 30.0 %, <69% indicates risk for recurrent falls in PD and <50% indicates a low level of physical functioning; 420 / 1600 = 26.3 % (09/30/23)  TODAY'S TREATMENT:   10/16/2023  NEUROMUSCULAR RE-EDUCATION: To improve balance, coordination, kinesthesia, posture, proprioception, reduce fall risk, amplitude of movement, speed of movement to reduce bradykinesia, and reduce rigidity.  Seated PWR! Moves: Up 2 x 10 Rock 2 x 10 Twist 2 x 10 - boom whackers (purple) used for added reach Step 2 x 10  PWR! Sit to stand x 10  SELF CARE: Provided education to increase independence with ADLs. Provided education on the role of PWR! Moves and correlation to everyday mobility and activities.  THERAPEUTIC ACTIVITIES: To improve functional performance.  Demonstration, verbal and tactile cues throughout for technique. : With 4WW = 8.53 sec W/o AD = 8.84 sec Gait speed: With 5TT = 3.85 ft/sec W/o AD = 3.71  ft/sec   10/09/23 THERAPEUTIC EXERCISE: To improve strength, endurance, ROM, and flexibility.  Demonstration, verbal and tactile cues throughout for technique.  NuStep - L4 x 6 min (UE/LE) S/L L bow & arrow open book stretch 10 x 5 Standing 3-way (60/90/120) doorway pec stretch 2 x 30 each position - best stretch felt at ~90 abduction Seated scap retraction with slight depression 10 x 5  MANUAL  THERAPY: To promote normalized muscle tension, improved flexibility, pain modulation, and reduced pain utilizing connective tissue massage, therapeutic massage, and manual TP therapy. STM/DTM and manual TPR to L UT, LS, rhomboids, subscapularis, teres group, lats and pecs - most TTP in subscapularis and pec minor but able to achieve good muscle relaxation and reduction in pain and muscle tension in all muscles addressed today. L scapular mobilization in R S/L   SELF CARE: Provided education to prevent loss of gains achieved with physical therapy and to prevent future decline in function.  Provided instruction in self-STM techniques to posterior shoulder complex using tennis ball on wall or Theracane.    10/07/23 THERAPEUTIC EXERCISE: To improve strength, endurance, ROM, and flexibility.  Rec Bike L3x6 min  THERAPEUTIC ACTIVITIES: To improve functional performance.  Demonstration, verbal and tactile cues throughout for technique.  LE MMT Goal Assessment for re-auth  Gait for 600 ft w/o SPC- noted gradual decrease in L step length after ~350 ft   MANUAL THERAPY: To promote normalized muscle tension and reduced pain utilizing therapeutic massage, manual TP therapy, and myofascial release.  STM, TrP release to L subscapularis (3 min)   SELF CARE: Provided education on PT POC progression, to reduce fall risk, and to prevent future decline in function. Provided education on community resources related to Parkinson's including support group and educational sessions, community-based PWR! Moves and exercise/movement groups as well as on line resources related to PD.    09/30/23 THERAPEUTIC EXERCISE: To improve strength, endurance, ROM, and flexibility.  Recumbent Bike L3x3min Seated hip abd Blue TB x 20 Seated LAQ 3lb x 20 BLE  NEUROMUSCULAR RE-EDUCATION: To improve coordination, kinesthesia, posture, and proprioception.  Step fwd and opp arm reach x 10 BLE Step fwd and scap retraction w/ weight  shift x 10 Standing rows GTB 2 x 10 staggered stance Standing shoulder ext GTB 2x10 staggered stance  Bird dog in standing x 10 B Clock balance x 5 BLE 12 to 6 o'clock Step downs from airex to floor fwd and lateral x 10 B   09/26/23 Nustep L5x26min UE/LE Standing shoulder ext RTB x 20 Seated rows RTB x 20 Standing step and reach x 10 B Lateral step and reach x 10 B Reverse chop RTB 2x10 B  Sit to stand x 20 with yellow weight ball + OHP  Seated LAQ 3lb x 20 BLE   PATIENT EDUCATION:  Education details: PWR! Moves - Seated  Person educated: Patient Education method: Explanation, Demonstration, Verbal cues, and Handouts Education comprehension: verbalized understanding, returned demonstration, verbal cues required, and needs further education   HOME EXERCISE PROGRAM: Access Code: [email or web address] URL: [email or web address] Date: 10/09/2023 Prepared by: Elijah Hidden  Exercises - Seated Piriformis Stretch  - 2 x daily - 7 x weekly - 2 sets - 2 reps - 30 sec hold - Seated Piriformis Stretch  - 2 x daily - 7 x weekly - 2 sets - 2 reps - 30 sec hold - Standing Hip Abduction with Counter Support  - 1 x daily - 7 x weekly - 2 sets - 10  reps - Standing Hip Extension with Counter Support  - 1 x daily - 7 x weekly - 2 sets - 10 reps - Standing March with Counter Support  - 1 x daily - 7 x weekly - 2 sets - 10 reps - Standing Hip Flexor Stretch with Foot Elevated  - 2 x daily - 7 x weekly - 2 sets - 2 reps - 30 sec hold - Sidelying Thoracic Rotation with Open Book  - 1 x daily - 7 x weekly - 10 reps - 5 sec hold - Doorway Pec Stretch at 90 Degrees Abduction  - 2 x daily - 7 x weekly - 3 reps - 30 sec hold - Seated Scapular Retraction  - 2 x daily - 7 x weekly - 2 sets - 10 reps - 5 sec hold - Theracane Over Shoulder  - 1-2 x daily - 7 x weekly - 1-2 min hold  Patient Education - Tips to reduce freezing episodes with standing or walking  PWR! Moves -  Seated   ASSESSMENT:  CLINICAL IMPRESSION: [name] [name] reports her L shoulder pain has been much better since she received an injection from her PCP last Friday, 10/10/2023.  We were able to introduce seated PWR! Moves today with no irritation/exacerbation of L shoulder pain and patient able to provide good return demonstration of movement patterns.  Instructions for seated PWR! Moves provided for HEP.  Explained rationale behind PWR! Moves as well as carryover of movement patterns into everyday functional movements, as well as planned progression to other positions for performance of PWR! Moves.  Reassessed gait speed with patient demonstrating significant improvement in gait speed both with and without 4WW, without evidence of freezing of gait - gait speed LTG met.  [name] [name] will benefit from continued skilled PT to address ongoing muscle tension, strength and balance deficits to improve mobility and activity tolerance with decreased pain interference and decreased risk for falls.   EVAL: [name] [name] is a 73 y.o. female who was referred to physical therapy for evaluation and treatment for Parkinson's disease.  She was first diagnosed with Parkinson's in 2017.  She has completed 1 prior PT episode in 2017 within the [facility] system.  Her most recent PT episode for Parkinson's disease was Aug - Dec 2024 at [facility] in [place].  Since her last PT episode, she reports changes in her mobility with worsening of shuffling gait and freezing of gait, resulting in need for increased reliance on 4WW/rollator.  She also notes increased difficulty with bed mobility of late.  Patient presents with physical impairments of decreased timing and coordination of gait, impaired ambulation, impaired standing balance, abnormal posture, bradykinesia with transfers, impaired activity tolerance, LE weakness, postural instability and decreased safety awareness impacting safe and independent functional mobility.  Examination  revealed patient is at risk for falls and functional decline as evidenced by the following objective test measures: 5xSTS of 10.79 sec (>15 sec indicates increased risk for falls and decreased BLE power), Gait speed of 2.34 ft/sec with 4WW and 2.50 ft/sec w/o AD (2.62 ft/sec is needed for safe community access), TUG of 9.63 sec with 4WW and 10.50 sec w/o AD (>13.5 sec indicates increased risk for falls), TUG Manual of 10.06 sec (difference between TUG manual and TUG >4.5 seconds indicates increased fall risk), and TUG cognitive of 12.66 sec (>/= 15 seconds indicates high risk for falls and community dwelling older adults).  TUG scores of >10% difference indicate difficulty with dual tasking.  Further testing of dynamic gait stability indicated and will be completed on next visit.  ABC scale score of 30% indicates a low level of physical functioning.  [name] [name] will benefit from skilled PT to address above deficits to improve mobility and activity tolerance to help reach the maximal level of functional independence with mobility and gait with reduced risk for falls.  Patient demonstrates understanding of this POC and is in agreement with this plan.   OBJECTIVE IMPAIRMENTS: Abnormal gait, decreased activity tolerance, decreased balance, decreased coordination, decreased knowledge of condition, decreased knowledge of use of DME, decreased mobility, difficulty walking, decreased strength, impaired perceived functional ability, increased muscle spasms, impaired flexibility, improper body mechanics, postural dysfunction, and pain.   ACTIVITY LIMITATIONS: standing, sleeping, stairs, transfers, bed mobility, bathing, locomotion level, and caring for others  PARTICIPATION LIMITATIONS: meal prep, cleaning, laundry, driving, shopping, and community activity  PERSONAL FACTORS: Fitness, Past/current experiences, Time since onset of injury/illness/exacerbation, and 3+ comorbidities: L common peroneal neuropathy (2021),  headache, OA, RLS, h/o back pain are also affecting patient's functional outcome.   REHAB POTENTIAL: Good  CLINICAL DECISION MAKING: Unstable/unpredictable  EVALUATION COMPLEXITY: High   GOALS: Goals reviewed with patient? Yes  SHORT TERM GOALS: Target date: 10/21/2023  Patient will be independent with initial HEP. Baseline:  Goal status: MET - 09/19/23  2.  Patient will demonstrate improvement in overall LE muscle strength by at least 1/2 grade on MMT. Baseline: Refer to above MMT table Goal status: IN PROGRESS - 10/07/23 - overall B LE strength improving   3.  Patient will be educated on strategies to decrease risk of falls.  Baseline:  Goal status: MET - 09/19/23 - education provided  4.  Patient will verbalize tips to reduce freezing/festination with gait and turns. Baseline:  09/23/23 able to verbalize some of the tips Goal status: MET - 10/07/23 - pt is able to discuss how to overcome freezing episodes and states they have helped  LONG TERM GOALS: Target date: 12/02/2023  Patient will be independent with ongoing/advanced HEP for self-management at home incorporating PWR! Moves as indicated .  Baseline:  Goal status: IN PROGRESS  2.  Patient will be able to ambulate 600' with or w/o LRAD with good safety and without freezing of gait to access community.  Baseline:  Goal status: PARTIALLY MET - 10/07/23 - pt walked w/o cane, noted slightly shorter steps at times on LLE, slowed down w/ walking and talking potentially decreasing safety awareness  3.  Patient will be able to step up/down curb safely with or w/oLRAD for safety with community ambulation.  Baseline:  Goal status: IN PROGRESS   4.  Patient will demonstrate gait speed of >/= 2.62 ft/sec to be a safe limited community ambulator with decreased risk for recurrent falls.  Baseline: 2.34 ft/sec with 4WW, 2.50 ft/sec w/o AD Goal status: MET - 10/16/23 - With 5TT = 3.85 ft/sec, W/o AD = 3.71 ft/sec  5.  Patient will  demonstrate at least a 4 point improvement on FGA to improve gait stability and reduce risk for falls. (MCID = 4 points) Baseline: TBA Goal status: IN PROGRESS  6.  Patient will report >/= 43% on ABC scale to demonstrate improved balance confidence and decreased risk for falls. Baseline: 480 / 1600 = 30.0 % Goal status: IN PROGRESS - 09/30/23 - 420 / 1600 = 26.3 %  7. Patient will verbalize understanding of local Parkinson's disease community resources, including community fitness post d/c. Baseline:  Goal status: IN PROGRESS -  10/07/23 - information on resources provided to patient today, handouts given on exercise classes for PD    PLAN:  PT FREQUENCY: 2x/week  PT DURATION: 12 weeks  PLANNED INTERVENTIONS: 97164- PT Re-evaluation, 97750- Physical Performance Testing, 97110-Therapeutic exercises, 97530- Therapeutic activity, 97112- Neuromuscular re-education, 97535- Self Care, [id number]- Manual therapy, [phone]- Gait training, [phone]- Electrical stimulation (unattended), [phone]- Electrical stimulation (manual), [id number]- Ultrasound, [id number]- Ionotophoresis 4mg /ml Dexamethasone , 79439 (1-2 muscles), 20561 (3+ muscles)- Dry Needling, Patient/Family education, Balance training, Stair training, Taping, Joint mobilization, DME instructions, Cryotherapy, and Moist heat  PLAN FOR NEXT SESSION: Reassess STG #2 (LE MMT); review of seated PWR Moves! & introduce additional positions as tolerated; work on improving foot clearance with gait; postural strengthening; rotational movements; further MT to L shoulder complex as needed    Elijah CHRISTELLA Hidden, PT 10/16/2023, 7:07 PM   Date of referral: 08/18/23 Referring provider: Evonnie Asberry RAMAN, DO Referring diagnosis? G20.A1 (ICD-10-CM) - Parkinson's disease without dyskinesia or fluctuating manifestations (HCC) Treatment diagnosis? (if different than referring diagnosis)  Other abnormalities of gait and mobility  Unsteadiness on feet  Muscle weakness  (generalized)  What was this (referring dx) caused by? Ongoing Issue  Lysle of Condition: Chronic (continuous duration > 3 months)   Laterality: Both  Current Functional Measure Score: ABC scale 420 / 1600 = 26.3 %  Objective measurements identify impairments when they are compared to normal values, the uninvolved extremity, and prior level of function.  [x]  Yes  []  No  Objective assessment of functional ability: Moderate functional limitations   Briefly describe symptoms: [name] [name] reports continued changes in her mobility w/ increased occurrence of freezing episode and shuffling gait since around May. She is still now using a single point cane when walking longer distances d/t decreased confidence, LE strength, and balance during gait. She currently has impairments in timing and balance during gait, impaired static and dynamic balance, bradykinesia with transfers, B LE weakness, postural instability, and dec'd safety awareness which is impacting her independent functional mobility. At this time, there is still a need for further assessment of dynamic gait and balance for fall risks (for both household and community settings) and is to be performed at upcoming visits. Pt's ABC score is currently 26.3% representing low level of physical functioning, which had declined w/ recent inc'd freezing episodes on a more frequent basis.    How did symptoms start: PT first diagnosed in 2017 with current exacerbation of symptoms beginning in April/May of this year with increasing difficulty with mobility noted, in particular getting in and out of bed and increased shuffling and freezing of gait.  Average pain intensity:  Last 24 hours: 8/10  Past week: up to 8/10  How often does the pt experience symptoms? Frequently  How much have the symptoms interfered with usual daily activities? Quite a bit  How has condition changed since care began at this facility? A little better  In general, how is the  patients overall health? Good  Onset date: PT first diagnosed in 2017 with current exacerbation of symptoms beginning in April/May 2025   BACK PAIN (STarT Back Screening Tool) - (When applicable): N/A  Has your back pain spread down your leg(s) at sometime in the last 2 weeks? []  Yes   []  No Have you had pain in the shoulder or neck at sometime in the past 2 weeks? []  Yes   []  No Have you only walked short distances because of your back pain? []  Yes   []  No In the  past 2 weeks, have you dressed more slowly than usual because of your back pain? []  Yes   []  No Do you think it is not really safe for person with a condition like yours to be physically active? []  Yes   []  No Have worrying thoughts been going through your mind a lot of the time? []  Yes   []  No Do you feel that your back pain is terrible and it is never going to get any better? []  Yes   []  No In general, have you stopped enjoying all the things you usually enjoy? []  Yes   []  No Overall, how bothersome has your back pain been in the last 2 weeks? []  Not at all   []  Slightly     []  Moderate   []  Very [id number]     [phone]  Extremely

## 2023-10-21 ENCOUNTER — Ambulatory Visit: Admitting: Physical Therapy

## 2023-10-21 ENCOUNTER — Encounter: Payer: Self-pay | Admitting: Physical Therapy

## 2023-10-21 DIAGNOSIS — M6281 Muscle weakness (generalized): Secondary | ICD-10-CM | POA: Diagnosis not present

## 2023-10-21 DIAGNOSIS — R2681 Unsteadiness on feet: Secondary | ICD-10-CM | POA: Diagnosis not present

## 2023-10-21 DIAGNOSIS — R2689 Other abnormalities of gait and mobility: Secondary | ICD-10-CM | POA: Diagnosis not present

## 2023-10-21 NOTE — Therapy (Signed)
 OUTPATIENT PHYSICAL THERAPY PARKINSON'S TREATMENT  Progress Note  Reporting Period 09/09/2023 to 10/21/2023   See note below for Objective Data and Assessment of Progress/Goals.    Patient Name: CHYNA KNEECE MRN: [id number] DOB:Jan 24, 1951, 73 y.o., female Today's Date: 10/21/2023   END OF SESSION:  PT End of Session - 10/21/23 1318     Visit Number 10    Date for PT Re-Evaluation 12/02/23    Authorization Type [employer]    Authorization Time Period 10/07/23 - 11/25/23    Authorization - Visit Number 4    Authorization - Number of Visits 14    PT Start Time 1318    PT Stop Time 1408    PT Time Calculation (min) 50 min    Activity Tolerance Treatment limited secondary to medical complications (Comment)   freezing of gait much more pronounced today   Behavior During Therapy [facility] for tasks assessed/performed;Anxious                 Past Medical History:  Diagnosis Date   Bursitis    Cellulitis    arm   Common peroneal neuropathy, left 04/27/2019   Headache    Hyperlipidemia    Insomnia    OA (osteoarthritis)    Onychomycosis    Osteoarthritis    Parkinson disease (HCC) 03/23/2015   RLS (restless legs syndrome) 03/23/2015   Tenosynovitis    Past Surgical History:  Procedure Laterality Date   TUBAL LIGATION     Patient Active Problem List   Diagnosis Date Noted   Common peroneal neuropathy, left 04/27/2019   Upper airway cough syndrome 05/03/2015   RLS (restless legs syndrome) 03/23/2015   Parkinson disease (HCC) 03/23/2015   Fever 08/29/2011   Headache 08/29/2011   Knee pain, right 08/29/2011   Back pain 08/29/2011   Bacteremia due to Gram-positive bacteria 08/29/2011   Hepatitis 08/29/2011   Periorbital edema 08/29/2011   Facial rash 08/29/2011   Blurred vision, right eye 08/29/2011   Tremor 08/29/2011   Meningitis 08/29/2011   CHEST PAIN UNSPECIFIED 03/04/2007   PNEUMONIA, LEFT LOWER LOBE 03/03/2007   COUGH, CHRONIC 03/03/2007    PCP: Toribio Jerel MATSU, MD   REFERRING PROVIDER: Evonnie Asberry RAMAN, DO   REFERRING DIAG: G20.A1 (ICD-10-CM) - Parkinson's disease without dyskinesia or fluctuating manifestations (HCC)  THERAPY DIAG:  Other abnormalities of gait and mobility  Unsteadiness on feet  Muscle weakness (generalized)  RATIONALE FOR EVALUATION AND TREATMENT: Rehabilitation  ONSET DATE: PD diagnosis in January 2017, worsening symptoms since April/May 2025  NEXT MD VISIT: 11/25/23   SUBJECTIVE:  SUBJECTIVE STATEMENT: Pt reports the PWR! Moves from last visit seemed to flare-up her L shoulder pain.  No pain currently.   Pt accompanied by: self - sister Otho) in the waiting room  PAIN: Are you having pain? No and Yes: NPRS scale: 0/10 currently, up to 5/10 last night  Pain location: L posterior shoulder  Pain description: sore, agony, tight  Aggravating factors: unpredictable  Relieving factors: stretching, lidocaine  patch   PERTINENT HISTORY:  L common peroneal neuropathy (2021), headache, OA, RLS, h/o back pain  PRECAUTIONS: None  RED FLAGS: None  WEIGHT BEARING RESTRICTIONS: No  FALLS:  Has patient fallen in last 6 months? No  LIVING ENVIRONMENT: Lives with: lives with their spouse or sister (when husband away for work) Lives in: [place] with husband, condo with sister Stairs: Yes: External: 1 steps; none - at her home Has following equipment at home: Single point cane, Environmental consultant - 4 wheeled, shower chair, and Grab bars  OCCUPATION: Retired  PLOF: Independent with household mobility with device, Independent with community mobility with device, Needs assistance with homemaking, and Leisure: scrabble, 4-5 days/week to gym until ~1 month ago  PATIENT GOALS: To be able to walk w/o the cement feeling in my feet -  a steady walk w/o shuffling.   OBJECTIVE: (objective measures completed at initial evaluation unless otherwise dated)  DIAGNOSTIC FINDINGS:  05/01/2022 - MRI cervical spine IMPRESSION:  1. Stable degenerative changes of the cervical spine with mild spinal canal stenosis at C2-3, C3-4 and C5-6.  2. Multilevel neural foraminal narrowing, severe on the right at C5-6 and moderate on the right at C3-4 and C7-T1.   COGNITION: Overall cognitive status: Within functional limits for tasks assessed   SENSATION: WFL Numbness in L great toe (intermittent)  COORDINATION: Gross motor - mild slowing  EDEMA:  Not recently  MUSCLE TONE: LLE: Mild into L ankle inversion  POSTURE:  rounded shoulders, forward head, flexed trunk , and L ankle drawing into inversion  MUSCLE LENGTH: Hamstrings: Mild tight B ITB: Mild tight L>R Piriformis: Mod tight B Hip flexors: Mod/severe tight B Quads: Mild/mod tight B Heelcord: Mild/mod tight B  LOWER EXTREMITY ROM:    Grossly WFL other than restrictions as noted above in muscle length  LOWER EXTREMITY MMT:    MMT Right eval Left eval R 10/07/23 L 10/07/23 R 10/21/23 L 10/21/23  Hip flexion 4 4 4- 4 4+ 4+  Hip extension 3- 3- 3+ 3+ 3+ 3+  Hip abduction 3 3 3+ 3+ 4- 4-  Hip adduction 4- 4- 4- 3+ 4 4  Hip internal rotation 4 4 4+ 4+ 4+ 4+  Hip external rotation 3+ 3+ 4 4 4+ 4+  Knee flexion 4+ 4 4+ 4+ 5 5  Knee extension 4+ 4+ 5 5 5 5   Ankle dorsiflexion 4 4- 4- 4+ 4+ 4+  Ankle plantarflexion 5 4+  5 4+    Ankle inversion 4- 3+ 4 4 4 4   Ankle eversion 4 4- 4 4 4 4   (Blank rows = not tested)  BED MOBILITY:  Sit to supine SBA Supine to sit SBA Rolling to Right SBA Rolling to Left SBA  TRANSFERS: Assistive device utilized: Environmental consultant - 4 wheeled and None  Sit to stand: Modified independence Stand to sit: Modified independence+ Chair to chair: Modified independence Floor: NT  GAIT: Distance walked: Clinic distances Assistive device utilized:  Environmental consultant - 4 wheeled and None Level of assistance: Modified independence and SBA Gait pattern: step through pattern, decreased  stride length, decreased hip/knee flexion- Right, decreased hip/knee flexion- Left, shuffling, and trunk flexed Comments: No freezing demonstrated during initial eval assessment however patient reports increasing episodes of freezing of gait recently  FUNCTIONAL TESTS:  5 times sit to stand: 10.79 sec w/o UE assist Timed up and go (TUG): 9.63 sec with 4WW; 10.50 sec w/o AD (normal), 10.06 sec (manual), 12.66 sec (cognitive - shifted from counting back by 3's to 2's mid test) 10 meter walk test: 14.00 sec with 4WW, 13.10 sec w/o AD Gait speed: 2.34 ft/sec with 4WW, 2.50 ft/sec w/o AD Functional gait assessment: 19/30 (09/16/23) Functional Gait Assessment  Gait assessed  Yes   Gait Level Surface Walks 20 ft, slow speed, abnormal gait pattern, evidence for imbalance or deviates 10-15 in outside of the 12 in walkway width. Requires more than 7 sec to ambulate 20 ft.   Change in Gait Speed Able to change speed, demonstrates mild gait deviations, deviates 6-10 in outside of the 12 in walkway width, or no gait deviations, unable to achieve a major change in velocity, or uses a change in velocity, or uses an assistive device.   Gait with Horizontal Head Turns Performs head turns smoothly with slight change in gait velocity (eg, minor disruption to smooth gait path), deviates 6-10 in outside 12 in walkway width, or uses an assistive device.   Gait with Vertical Head Turns Performs task with slight change in gait velocity (eg, minor disruption to smooth gait path), deviates 6 - 10 in outside 12 in walkway width or uses assistive device   Gait and Pivot Turn Pivot turns safely within 3 sec and stops quickly with no loss of balance.   Step Over Obstacle Is able to step over one shoe box (4.5 in total height) without changing gait speed. No evidence of imbalance.   Gait with Narrow Base  of Support Ambulates 7-9 steps.   Gait with Eyes Closed Walks 20 ft, uses assistive device, slower speed, mild gait deviations, deviates 6-10 in outside 12 in walkway width. Ambulates 20 ft in less than 9 sec but greater than 7 sec.   Ambulating Backwards Walks 20 ft, slow speed, abnormal gait pattern, evidence for imbalance, deviates 10-15 in outside 12 in walkway width.   Steps Alternating feet, must use rail.   Total Score 19      FGA Interpretation of scores: Non-Specific Older Adults Cutoff Score: <=22/30 = risk of falls Parkinson's Disease Cutoff score <15/30 = fall risk (Hoehn & Yahr 1-4) Minimally Clinically Important Difference (MCID)  Stroke (acute, subacute, and chronic) = MDC: 4.2 points Vestibular (acute) = MDC: 6 points Community Dwelling Older Adults =  MCID: 4 points Parkinson's Disease  =  MDC: 4.3 points  PATIENT SURVEYS:  ABC scale: 480 / 1600 = 30.0 %, <69% indicates risk for recurrent falls in PD and <50% indicates a low level of physical functioning; 420 / 1600 = 26.3 % (09/30/23)   TODAY'S TREATMENT:   10/21/2023 THERAPEUTIC EXERCISE: To improve strength and endurance.  Demonstration, verbal and tactile cues throughout for technique.  Rec Bike - L3 x 6 min  THERAPEUTIC ACTIVITIES: To improve functional performance.  Demonstration, verbal and tactile cues throughout for technique. LE MMT  Goal assessment  PHYSICAL PERFORMANCE TEST or MEASUREMENT: Functional Gait  Assessment  Gait Level Surface Walks 20 ft, slow speed, abnormal gait pattern, evidence for imbalance or deviates 10-15 in outside of the 12 in walkway width. Requires more than 7 sec to ambulate 20  ft.   Change in Gait Speed Able to change speed, demonstrates mild gait deviations, deviates 6-10 in outside of the 12 in walkway width, or no gait deviations, unable to achieve a major change in velocity, or uses a change in velocity, or uses an assistive device.   Gait with Horizontal Head Turns Performs  head turns smoothly with slight change in gait velocity (eg, minor disruption to smooth gait path), deviates 6-10 in outside 12 in walkway width, or uses an assistive device.   Gait with Vertical Head Turns Performs task with slight change in gait velocity (eg, minor disruption to smooth gait path), deviates 6 - 10 in outside 12 in walkway width or uses assistive device   Gait and Pivot Turn Pivot turns safely in greater than 3 sec and stops with no loss of balance, or pivot turns safely within 3 sec and stops with mild imbalance, requires small steps to catch balance.   Step Over Obstacle Is able to step over one shoe box (4.5 in total height) but must slow down and adjust steps to clear box safely. May require verbal cueing.   Gait with Narrow Base of Support Ambulates less than 4 steps heel to toe or cannot perform without assistance.   Gait with Eyes Closed Walks 20 ft, slow speed, abnormal gait pattern, evidence for imbalance, deviates 10-15 in outside 12 in walkway width. Requires more than 9 sec to ambulate 20 ft.   Ambulating Backwards Walks 20 ft, slow speed, abnormal gait pattern, evidence for imbalance, deviates 10-15 in outside 12 in walkway width.   Steps Alternating feet, must use rail.   Total Score 14   FGA comment: < 19 = high risk fall      FGA assessment significantly limited by increased freezing of gait today  SELF CARE:  Reviewed education on tips to prevent freezing with transfers and gait, incorporating postural reset, deep breaths and count to 5, weight shifting/marching in place, and use of visual cues to increase step length to help work through freezing episodes.    10/16/2023  NEUROMUSCULAR RE-EDUCATION: To improve balance, coordination, kinesthesia, posture, proprioception, reduce fall risk, amplitude of movement, speed of movement to reduce bradykinesia, and reduce rigidity.  Seated PWR! Moves: Up 2 x 10 Rock 2 x 10 Twist 2 x 10 - boom whackers (purple) used for  added reach Step 2 x 10  PWR! Sit to stand x 10  SELF CARE: Provided education to increase independence with ADLs. Provided education on the role of PWR! Moves and correlation to everyday mobility and activities.  THERAPEUTIC ACTIVITIES: To improve functional performance.  Demonstration, verbal and tactile cues throughout for technique. : With 4WW = 8.53 sec W/o AD = 8.84 sec Gait speed: With 5TT = 3.85 ft/sec W/o AD = 3.71 ft/sec   10/09/23 THERAPEUTIC EXERCISE: To improve strength, endurance, ROM, and flexibility.  Demonstration, verbal and tactile cues throughout for technique.  NuStep - L4 x 6 min (UE/LE) S/L L bow & arrow open book stretch 10 x 5 Standing 3-way (60/90/120) doorway pec stretch 2 x 30 each position - best stretch felt at ~90 abduction Seated scap retraction with slight depression 10 x 5  MANUAL THERAPY: To promote normalized muscle tension, improved flexibility, pain modulation, and reduced pain utilizing connective tissue massage, therapeutic massage, and manual TP therapy. STM/DTM and manual TPR to L UT, LS, rhomboids, subscapularis, teres group, lats and pecs - most TTP in subscapularis and pec minor but able to achieve  good muscle relaxation and reduction in pain and muscle tension in all muscles addressed today. L scapular mobilization in R S/L   SELF CARE: Provided education to prevent loss of gains achieved with physical therapy and to prevent future decline in function.  Provided instruction in self-STM techniques to posterior shoulder complex using tennis ball on wall or Theracane.    10/07/23 THERAPEUTIC EXERCISE: To improve strength, endurance, ROM, and flexibility.  Rec Bike L3x6 min  THERAPEUTIC ACTIVITIES: To improve functional performance.  Demonstration, verbal and tactile cues throughout for technique.  LE MMT Goal Assessment for re-auth  Gait for 600 ft w/o SPC- noted gradual decrease in L step length after ~350 ft   MANUAL  THERAPY: To promote normalized muscle tension and reduced pain utilizing therapeutic massage, manual TP therapy, and myofascial release.  STM, TrP release to L subscapularis (3 min)   SELF CARE: Provided education on PT POC progression, to reduce fall risk, and to prevent future decline in function. Provided education on community resources related to Parkinson's including support group and educational sessions, community-based PWR! Moves and exercise/movement groups as well as on line resources related to PD.    09/30/23 THERAPEUTIC EXERCISE: To improve strength, endurance, ROM, and flexibility.  Recumbent Bike L3x28min Seated hip abd Blue TB x 20 Seated LAQ 3lb x 20 BLE  NEUROMUSCULAR RE-EDUCATION: To improve coordination, kinesthesia, posture, and proprioception.  Step fwd and opp arm reach x 10 BLE Step fwd and scap retraction w/ weight shift x 10 Standing rows GTB 2 x 10 staggered stance Standing shoulder ext GTB 2x10 staggered stance  Bird dog in standing x 10 B Clock balance x 5 BLE 12 to 6 o'clock Step downs from airex to floor fwd and lateral x 10 B   09/26/23 Nustep L5x66min UE/LE Standing shoulder ext RTB x 20 Seated rows RTB x 20 Standing step and reach x 10 B Lateral step and reach x 10 B Reverse chop RTB 2x10 B  Sit to stand x 20 with yellow weight ball + OHP  Seated LAQ 3lb x 20 BLE   PATIENT EDUCATION:  Education details: PWR! Moves - Seated  Person educated: Patient Education method: Explanation, Demonstration, Verbal cues, and Handouts Education comprehension: verbalized understanding, returned demonstration, verbal cues required, and needs further education   HOME EXERCISE PROGRAM: Access Code: [email or web address] URL: [email or web address] Date: 10/09/2023 Prepared by: Elijah Hidden  Exercises - Seated Piriformis Stretch  - 2 x daily - 7 x weekly - 2 sets - 2 reps - 30 sec hold - Seated Piriformis Stretch  - 2 x daily - 7 x weekly - 2 sets - 2 reps - 30  sec hold - Standing Hip Abduction with Counter Support  - 1 x daily - 7 x weekly - 2 sets - 10 reps - Standing Hip Extension with Counter Support  - 1 x daily - 7 x weekly - 2 sets - 10 reps - Standing March with Counter Support  - 1 x daily - 7 x weekly - 2 sets - 10 reps - Standing Hip Flexor Stretch with Foot Elevated  - 2 x daily - 7 x weekly - 2 sets - 2 reps - 30 sec hold - Sidelying Thoracic Rotation with Open Book  - 1 x daily - 7 x weekly - 10 reps - 5 sec hold - Doorway Pec Stretch at 90 Degrees Abduction  - 2 x daily - 7 x weekly - 3 reps - 30 sec hold - Seated  Scapular Retraction  - 2 x daily - 7 x weekly - 2 sets - 10 reps - 5 sec hold - Theracane Over Shoulder  - 1-2 x daily - 7 x weekly - 1-2 min hold  Patient Education - Tips to reduce freezing episodes with standing or walking  PWR! Moves - Seated   ASSESSMENT:  CLINICAL IMPRESSION: Malayasia reports seated PWR! Moves last visit seem to trigger her L shoulder pain again, although pain resolved today.  She demonstrated increased freezing of gait, triggered by changes in floor coloring and texture as well as narrowing of space at doorways and hallways, therefore reviewed strategies to reduce and work through freezing episodes.  Freezing episodes impacting FGA reassessment today with score reduced to 14/30 as a result of freezing episodes.  MMT revealing improving overall LE strength with STG #2 now met.  Despite increased limitation with mobility today due to freezing episodes, overall Danaija is demonstrating good progress with PT with all STG's now met.  She will benefit from continued skilled PT to address ongoing abnormal muscle tension in L shoulder, as well as strength and balance deficits to improve mobility and activity tolerance with decreased pain interference and decreased risk for falls.   EVAL: Aanyah P Bolyard is a 73 y.o. female who was referred to physical therapy for evaluation and treatment for Parkinson's disease.   She was first diagnosed with Parkinson's in 2017.  She has completed 1 prior PT episode in 2017 within the [facility] system.  Her most recent PT episode for Parkinson's disease was Aug - Dec 2024 at [facility] in [place].  Since her last PT episode, she reports changes in her mobility with worsening of shuffling gait and freezing of gait, resulting in need for increased reliance on 4WW/rollator.  She also notes increased difficulty with bed mobility of late.  Patient presents with physical impairments of decreased timing and coordination of gait, impaired ambulation, impaired standing balance, abnormal posture, bradykinesia with transfers, impaired activity tolerance, LE weakness, postural instability and decreased safety awareness impacting safe and independent functional mobility.  Examination revealed patient is at risk for falls and functional decline as evidenced by the following objective test measures: 5xSTS of 10.79 sec (>15 sec indicates increased risk for falls and decreased BLE power), Gait speed of 2.34 ft/sec with 4WW and 2.50 ft/sec w/o AD (2.62 ft/sec is needed for safe community access), TUG of 9.63 sec with 4WW and 10.50 sec w/o AD (>13.5 sec indicates increased risk for falls), TUG Manual of 10.06 sec (difference between TUG manual and TUG >4.5 seconds indicates increased fall risk), and TUG cognitive of 12.66 sec (>/= 15 seconds indicates high risk for falls and community dwelling older adults).  TUG scores of >10% difference indicate difficulty with dual tasking.  Further testing of dynamic gait stability indicated and will be completed on next visit.  ABC scale score of 30% indicates a low level of physical functioning.  Neidra will benefit from skilled PT to address above deficits to improve mobility and activity tolerance to help reach the maximal level of functional independence with mobility and gait with reduced risk for falls.  Patient demonstrates understanding of this POC and is in  agreement with this plan.   OBJECTIVE IMPAIRMENTS: Abnormal gait, decreased activity tolerance, decreased balance, decreased coordination, decreased knowledge of condition, decreased knowledge of use of DME, decreased mobility, difficulty walking, decreased strength, impaired perceived functional ability, increased muscle spasms, impaired flexibility, improper body mechanics, postural dysfunction, and pain.  ACTIVITY LIMITATIONS: standing, sleeping, stairs, transfers, bed mobility, bathing, locomotion level, and caring for others  PARTICIPATION LIMITATIONS: meal prep, cleaning, laundry, driving, shopping, and community activity  PERSONAL FACTORS: Fitness, Past/current experiences, Time since onset of injury/illness/exacerbation, and 3+ comorbidities: L common peroneal neuropathy (2021), headache, OA, RLS, h/o back pain are also affecting patient's functional outcome.   REHAB POTENTIAL: Good  CLINICAL DECISION MAKING: Unstable/unpredictable  EVALUATION COMPLEXITY: High   GOALS: Goals reviewed with patient? Yes  SHORT TERM GOALS: Target date: 10/21/2023  Patient will be independent with initial HEP. Baseline:  Goal status: MET - 09/19/23  2.  Patient will demonstrate improvement in overall LE muscle strength by at least 1/2 grade on MMT. Baseline: Refer to above MMT table 10/07/23 - overall B LE strength improving  Goal status: MET - 10/21/23  3.  Patient will be educated on strategies to decrease risk of falls.  Baseline:  Goal status: MET - 09/19/23 - education provided  4.  Patient will verbalize tips to reduce freezing/festination with gait and turns. Baseline:  09/23/23 able to verbalize some of the tips Goal status: MET - 10/07/23 - pt is able to discuss how to overcome freezing episodes and states they have helped  LONG TERM GOALS: Target date: 12/02/2023  Patient will be independent with ongoing/advanced HEP for self-management at home incorporating PWR! Moves as indicated .   Baseline:  Goal status: IN PROGRESS  2.  Patient will be able to ambulate 600' with or w/o LRAD with good safety and without freezing of gait to access community.  Baseline:  Goal status: PARTIALLY MET - 10/07/23 - pt walked w/o cane, noted slightly shorter steps at times on LLE, slowed down w/ walking and talking potentially decreasing safety awareness  3.  Patient will be able to step up/down curb safely with or w/oLRAD for safety with community ambulation.  Baseline:  Goal status: IN PROGRESS   4.  Patient will demonstrate gait speed of >/= 2.62 ft/sec to be a safe limited community ambulator with decreased risk for recurrent falls.  Baseline: 2.34 ft/sec with 4WW, 2.50 ft/sec w/o AD Goal status: MET - 10/16/23 - With 5TT = 3.85 ft/sec, W/o AD = 3.71 ft/sec  5.  Patient will demonstrate at least a 4 point improvement on FGA to improve gait stability and reduce risk for falls. (MCID = 4 points) Baseline: 19/30 (09/16/23) Goal status: IN PROGRESS - 10/21/23 - 14/30  6.  Patient will report >/= 43% on ABC scale to demonstrate improved balance confidence and decreased risk for falls. Baseline: 480 / 1600 = 30.0 % Goal status: IN PROGRESS - 09/30/23 - 420 / 1600 = 26.3 %  7. Patient will verbalize understanding of local Parkinson's disease community resources, including community fitness post d/c. Baseline:  Goal status: IN PROGRESS - 10/07/23 - information on resources provided to patient today, handouts given on exercise classes for PD    PLAN:  PT FREQUENCY: 2x/week  PT DURATION: 12 weeks  PLANNED INTERVENTIONS: 97164- PT Re-evaluation, 97750- Physical Performance Testing, 97110-Therapeutic exercises, 97530- Therapeutic activity, [id number]- Neuromuscular re-education, 97535- Self Care, [id number]- Manual therapy, [id number]- Gait training, [phone]- Electrical stimulation (unattended), [id number]- Electrical stimulation (manual), [id number]- Ultrasound, [id number]- Ionotophoresis 4mg /ml Dexamethasone , [id number] (1-2  muscles), [id number] (3+ muscles)- Dry Needling, Patient/Family education, Balance training, Stair training, Taping, Joint mobilization, DME instructions, Cryotherapy, and Moist heat  PLAN FOR NEXT SESSION: Review of seated PWR Moves! & introduce additional positions as tolerated; continue to address strategies to reduce freezing with  transfers and gait; work on improving foot clearance with gait; postural strengthening; rotational movements; further MT to L shoulder complex as needed    Authur Cubit M Kayden Hutmacher, PT 10/21/2023, 2:26 PM   Date of referral: 08/18/23 Referring provider: Evonnie Asberry RAMAN, DO Referring diagnosis? G20.A1 (ICD-10-CM) - Parkinson's disease without dyskinesia or fluctuating manifestations (HCC) Treatment diagnosis? (if different than referring diagnosis)  Other abnormalities of gait and mobility  Unsteadiness on feet  Muscle weakness (generalized)  What was this (referring dx) caused by? Ongoing Issue  Lysle of Condition: Chronic (continuous duration > 3 months)   Laterality: Both  Current Functional Measure Score: ABC scale 420 / 1600 = 26.3 %  Objective measurements identify impairments when they are compared to normal values, the uninvolved extremity, and prior level of function.  [x]  Yes  []  No  Objective assessment of functional ability: Moderate functional limitations   Briefly describe symptoms: Nahjae reports continued changes in her mobility w/ increased occurrence of freezing episode and shuffling gait since around May. She is still now using a single point cane when walking longer distances d/t decreased confidence, LE strength, and balance during gait. She currently has impairments in timing and balance during gait, impaired static and dynamic balance, bradykinesia with transfers, B LE weakness, postural instability, and dec'd safety awareness which is impacting her independent functional mobility. At this time, there is still a need for further assessment of  dynamic gait and balance for fall risks (for both household and community settings) and is to be performed at upcoming visits. Pt's ABC score is currently 26.3% representing low level of physical functioning, which had declined w/ recent inc'd freezing episodes on a more frequent basis.    How did symptoms start: PT first diagnosed in 2017 with current exacerbation of symptoms beginning in April/May of this year with increasing difficulty with mobility noted, in particular getting in and out of bed and increased shuffling and freezing of gait.  Average pain intensity:  Last 24 hours: 8/10  Past week: up to 8/10  How often does the pt experience symptoms? Frequently  How much have the symptoms interfered with usual daily activities? Quite a bit  How has condition changed since care began at this facility? A little better  In general, how is the patients overall health? Good  Onset date: PT first diagnosed in 2017 with current exacerbation of symptoms beginning in April/May 2025   BACK PAIN (STarT Back Screening Tool) - (When applicable): N/A  Has your back pain spread down your leg(s) at sometime in the last 2 weeks? []  Yes   []  No Have you had pain in the shoulder or neck at sometime in the past 2 weeks? []  Yes   []  No Have you only walked short distances because of your back pain? []  Yes   []  No In the past 2 weeks, have you dressed more slowly than usual because of your back pain? []  Yes   []  No Do you think it is not really safe for person with a condition like yours to be physically active? []  Yes   []  No Have worrying thoughts been going through your mind a lot of the time? []  Yes   []  No Do you feel that your back pain is terrible and it is never going to get any better? []  Yes   []  No In general, have you stopped enjoying all the things you usually enjoy? []  Yes   []  No Overall, how bothersome has your  back pain been in the last 2 weeks? []  Not at all   []  Slightly      []  Moderate   []  Very much     []  Extremely

## 2023-10-23 ENCOUNTER — Encounter: Admitting: Physical Therapy

## 2023-10-28 ENCOUNTER — Ambulatory Visit: Attending: Neurology | Admitting: Physical Therapy

## 2023-10-28 ENCOUNTER — Encounter: Payer: Self-pay | Admitting: Physical Therapy

## 2023-10-28 DIAGNOSIS — M6281 Muscle weakness (generalized): Secondary | ICD-10-CM | POA: Diagnosis not present

## 2023-10-28 DIAGNOSIS — R2689 Other abnormalities of gait and mobility: Secondary | ICD-10-CM | POA: Diagnosis not present

## 2023-10-28 DIAGNOSIS — R2681 Unsteadiness on feet: Secondary | ICD-10-CM | POA: Insufficient documentation

## 2023-10-28 NOTE — Therapy (Signed)
 OUTPATIENT PHYSICAL THERAPY PARKINSON'S TREATMENT     Patient Name: [name] [name] MRN: [id number] DOB:[birth year], 73 y.o., female Today's Date: 10/28/2023   END OF SESSION:  PT End of Session - 10/28/23 1316     Visit Number 11    Date for PT Re-Evaluation 12/02/23    Authorization Type [employer] Medicare    Authorization Time Period 10/07/23 - 11/25/23    Authorization - Visit Number 5    Authorization - Number of Visits 14    PT Start Time 1316    PT Stop Time 1404    PT Time Calculation (min) 48 min    Activity Tolerance Patient tolerated treatment well;No increased pain;Patient limited by fatigue    Behavior During Therapy [facility] for tasks assessed/performed                  Past Medical History:  Diagnosis Date   Bursitis    Cellulitis    arm   Common peroneal neuropathy, left 04/27/2019   Headache    Hyperlipidemia    Insomnia    OA (osteoarthritis)    Onychomycosis    Osteoarthritis    Parkinson disease (HCC) 03/23/2015   RLS (restless legs syndrome) 03/23/2015   Tenosynovitis    Past Surgical History:  Procedure Laterality Date   TUBAL LIGATION     Patient Active Problem List   Diagnosis Date Noted   Common peroneal neuropathy, left 04/27/2019   Upper airway cough syndrome 05/03/2015   RLS (restless legs syndrome) 03/23/2015   Parkinson disease (HCC) 03/23/2015   Fever 08/29/2011   Headache 08/29/2011   Knee pain, right 08/29/2011   Back pain 08/29/2011   Bacteremia due to Gram-positive bacteria 08/29/2011   Hepatitis 08/29/2011   Periorbital edema 08/29/2011   Facial rash 08/29/2011   Blurred vision, right eye 08/29/2011   Tremor 08/29/2011   Meningitis 08/29/2011   CHEST PAIN UNSPECIFIED 03/04/2007   PNEUMONIA, LEFT LOWER LOBE 03/03/2007   COUGH, CHRONIC 03/03/2007    PCP: Toribio Jerel MATSU, MD   REFERRING PROVIDER: Evonnie Asberry RAMAN, DO   REFERRING DIAG: G20.A1 (ICD-10-CM) - Parkinson's disease without dyskinesia or fluctuating manifestations  (HCC)  THERAPY DIAG:  Other abnormalities of gait and mobility  Unsteadiness on feet  Muscle weakness (generalized)  RATIONALE FOR EVALUATION AND TREATMENT: Rehabilitation  ONSET DATE: PD diagnosis in January 2017, worsening symptoms since April/May 2025  NEXT MD VISIT: 11/25/23   SUBJECTIVE:  SUBJECTIVE STATEMENT: Pt reports her L shoulder pain has been better.  No pain currently.  She states she plans to go to the Parkinson's Disease and Movement Disorder Symposium on 11/14/23 with 2 of her sisters.  Pt accompanied by: self - sister Otho) in the waiting room  PAIN: Are you having pain? No and Yes: NPRS scale: 0/10   Pain location: L posterior shoulder  Pain description: sore, agony, tight  Aggravating factors: unpredictable  Relieving factors: stretching, lidocaine  patch   PERTINENT HISTORY:  L common peroneal neuropathy (2021), headache, OA, RLS, h/o back pain  PRECAUTIONS: None  RED FLAGS: None  WEIGHT BEARING RESTRICTIONS: No  FALLS:  Has patient fallen in last 6 months? No  LIVING ENVIRONMENT: Lives with: lives with their spouse or sister (when husband away for work) Lives in: [place] with husband, condo with sister Stairs: Yes: External: 1 steps; none - at her home Has following equipment at home: Single point cane, Environmental consultant - 4 wheeled, shower chair, and Grab bars  OCCUPATION: Retired  PLOF: Independent with household mobility with device, Independent with community mobility with device, Needs assistance with homemaking, and Leisure: scrabble, 4-5 days/week to gym until ~1 month ago  PATIENT GOALS: To be able to walk w/o the cement feeling in my feet - a steady walk w/o shuffling.   OBJECTIVE: (objective measures completed at initial evaluation unless  otherwise dated)  DIAGNOSTIC FINDINGS:  05/01/2022 - MRI cervical spine IMPRESSION:  1. Stable degenerative changes of the cervical spine with mild spinal canal stenosis at C2-3, C3-4 and C5-6.  2. Multilevel neural foraminal narrowing, severe on the right at C5-6 and moderate on the right at C3-4 and C7-T1.   COGNITION: Overall cognitive status: Within functional limits for tasks assessed   SENSATION: WFL Numbness in L great toe (intermittent)  COORDINATION: Gross motor - mild slowing  EDEMA:  Not recently  MUSCLE TONE: LLE: Mild into L ankle inversion  POSTURE:  rounded shoulders, forward head, flexed trunk , and L ankle drawing into inversion  MUSCLE LENGTH: Hamstrings: Mild tight B ITB: Mild tight L>R Piriformis: Mod tight B Hip flexors: Mod/severe tight B Quads: Mild/mod tight B Heelcord: Mild/mod tight B  LOWER EXTREMITY ROM:    Grossly WFL other than restrictions as noted above in muscle length  LOWER EXTREMITY MMT:    MMT Right eval Left eval R 10/07/23 L 10/07/23 R 10/21/23 L 10/21/23  Hip flexion 4 4 4- 4 4+ 4+  Hip extension 3- 3- 3+ 3+ 3+ 3+  Hip abduction 3 3 3+ 3+ 4- 4-  Hip adduction 4- 4- 4- 3+ 4 4  Hip internal rotation 4 4 4+ 4+ 4+ 4+  Hip external rotation 3+ 3+ 4 4 4+ 4+  Knee flexion 4+ 4 4+ 4+ 5 5  Knee extension 4+ 4+ 5 5 5 5   Ankle dorsiflexion 4 4- 4- 4+ 4+ 4+  Ankle plantarflexion 5 4+  5 4+    Ankle inversion 4- 3+ 4 4 4 4   Ankle eversion 4 4- 4 4 4 4   (Blank rows = not tested)  BED MOBILITY:  Sit to supine SBA Supine to sit SBA Rolling to Right SBA Rolling to Left SBA  TRANSFERS: Assistive device utilized: Environmental consultant - 4 wheeled and None  Sit to stand: Modified independence Stand to sit: Modified independence+ Chair to chair: Modified independence Floor: NT  GAIT: Distance walked: Clinic distances Assistive device utilized: Environmental consultant - 4 wheeled and None Level of assistance: Modified  independence and SBA Gait pattern: step through  pattern, decreased stride length, decreased hip/knee flexion- Right, decreased hip/knee flexion- Left, shuffling, and trunk flexed Comments: No freezing demonstrated during initial eval assessment however patient reports increasing episodes of freezing of gait recently  FUNCTIONAL TESTS:  5 times sit to stand: 10.79 sec w/o UE assist Timed up and go (TUG): 9.63 sec with 4WW; 10.50 sec w/o AD (normal), 10.06 sec (manual), 12.66 sec (cognitive - shifted from counting back by 3's to 2's mid test) 10 meter walk test: 14.00 sec with 4WW, 13.10 sec w/o AD Gait speed: 2.34 ft/sec with 4WW, 2.50 ft/sec w/o AD Functional gait assessment: 19/30 (09/16/23) Functional Gait Assessment  Gait assessed  Yes   Gait Level Surface Walks 20 ft, slow speed, abnormal gait pattern, evidence for imbalance or deviates 10-15 in outside of the 12 in walkway width. Requires more than 7 sec to ambulate 20 ft.   Change in Gait Speed Able to change speed, demonstrates mild gait deviations, deviates 6-10 in outside of the 12 in walkway width, or no gait deviations, unable to achieve a major change in velocity, or uses a change in velocity, or uses an assistive device.   Gait with Horizontal Head Turns Performs head turns smoothly with slight change in gait velocity (eg, minor disruption to smooth gait path), deviates 6-10 in outside 12 in walkway width, or uses an assistive device.   Gait with Vertical Head Turns Performs task with slight change in gait velocity (eg, minor disruption to smooth gait path), deviates 6 - 10 in outside 12 in walkway width or uses assistive device   Gait and Pivot Turn Pivot turns safely within 3 sec and stops quickly with no loss of balance.   Step Over Obstacle Is able to step over one shoe box (4.5 in total height) without changing gait speed. No evidence of imbalance.   Gait with Narrow Base of Support Ambulates 7-9 steps.   Gait with Eyes Closed Walks 20 ft, uses assistive device, slower speed,  mild gait deviations, deviates 6-10 in outside 12 in walkway width. Ambulates 20 ft in less than 9 sec but greater than 7 sec.   Ambulating Backwards Walks 20 ft, slow speed, abnormal gait pattern, evidence for imbalance, deviates 10-15 in outside 12 in walkway width.   Steps Alternating feet, must use rail.   Total Score 19      FGA Interpretation of scores: Non-Specific Older Adults Cutoff Score: <=22/30 = risk of falls Parkinson's Disease Cutoff score <15/30 = fall risk (Hoehn & Yahr 1-4) Minimally Clinically Important Difference (MCID)  Stroke (acute, subacute, and chronic) = MDC: 4.2 points Vestibular (acute) = MDC: 6 points Community Dwelling Older Adults =  MCID: 4 points Parkinson's Disease  =  MDC: 4.3 points  PATIENT SURVEYS:  ABC scale: 480 / 1600 = 30.0 %, <69% indicates risk for recurrent falls in PD and <50% indicates a low level of physical functioning; 420 / 1600 = 26.3 % (09/30/23)   TODAY'S TREATMENT:   10/28/2023 THERAPEUTIC EXERCISE: To improve strength and endurance.   Rec Bike - L3 x 6 min  NEUROMUSCULAR RE-EDUCATION: To improve balance, coordination, kinesthesia, posture, proprioception, reduce fall risk, amplitude of movement, speed of movement to reduce bradykinesia, and reduce rigidity. Seated PWR! Moves: Up x 10 Rock x 10 Twist x 10 Step x 10 Standing PWR! Moves: Up x 10 Rock x 10 Twist x 10 Step x 10 Standing RTB scap retraction + B shoulder rows  10 x 3 Standing RTB scap retraction + B shoulder extension 10 x 3  GAIT TRAINING: To normalize gait pattern, improve safety with SPC vs hiking poles, and prepare to wean to LRAD.  180' with SPC on R - cues for sequencing of SPC to promote reciprocal gait pattern including reciprocal L arm swing Curb negotiation with SPC on R and SBA of PT with VC's for sequencing 180' with hiking pole in R hand - cues for sequencing of pole to promote reciprocal gait pattern including reciprocal L arm swing 90' with B  hiking poles - cues for sequencing of poles to promote reciprocal gait pattern  Curb negotiation with B hiking poles and SBA of PT with VC's for sequencing   10/21/2023 THERAPEUTIC EXERCISE: To improve strength and endurance.  Demonstration, verbal and tactile cues throughout for technique.  Rec Bike - L3 x 6 min  THERAPEUTIC ACTIVITIES: To improve functional performance.  Demonstration, verbal and tactile cues throughout for technique. LE MMT  Goal assessment  PHYSICAL PERFORMANCE TEST or MEASUREMENT: Functional Gait  Assessment  Gait Level Surface Walks 20 ft, slow speed, abnormal gait pattern, evidence for imbalance or deviates 10-15 in outside of the 12 in walkway width. Requires more than 7 sec to ambulate 20 ft.   Change in Gait Speed Able to change speed, demonstrates mild gait deviations, deviates 6-10 in outside of the 12 in walkway width, or no gait deviations, unable to achieve a major change in velocity, or uses a change in velocity, or uses an assistive device.   Gait with Horizontal Head Turns Performs head turns smoothly with slight change in gait velocity (eg, minor disruption to smooth gait path), deviates 6-10 in outside 12 in walkway width, or uses an assistive device.   Gait with Vertical Head Turns Performs task with slight change in gait velocity (eg, minor disruption to smooth gait path), deviates 6 - 10 in outside 12 in walkway width or uses assistive device   Gait and Pivot Turn Pivot turns safely in greater than 3 sec and stops with no loss of balance, or pivot turns safely within 3 sec and stops with mild imbalance, requires small steps to catch balance.   Step Over Obstacle Is able to step over one shoe box (4.5 in total height) but must slow down and adjust steps to clear box safely. May require verbal cueing.   Gait with Narrow Base of Support Ambulates less than 4 steps heel to toe or cannot perform without assistance.   Gait with Eyes Closed Walks 20 ft, slow  speed, abnormal gait pattern, evidence for imbalance, deviates 10-15 in outside 12 in walkway width. Requires more than 9 sec to ambulate 20 ft.   Ambulating Backwards Walks 20 ft, slow speed, abnormal gait pattern, evidence for imbalance, deviates 10-15 in outside 12 in walkway width.   Steps Alternating feet, must use rail.   Total Score 14   FGA comment: < 19 = high risk fall      FGA assessment significantly limited by increased freezing of gait today  SELF CARE:  Reviewed education on tips to prevent freezing with transfers and gait, incorporating postural reset, deep breaths and count to 5, weight shifting/marching in place, and use of visual cues to increase step length to help work through freezing episodes.    10/16/2023  NEUROMUSCULAR RE-EDUCATION: To improve balance, coordination, kinesthesia, posture, proprioception, reduce fall risk, amplitude of movement, speed of movement to reduce bradykinesia, and reduce rigidity.  Seated  PWR! Moves: Up 2 x 10 Rock 2 x 10 Twist 2 x 10 - boom whackers (purple) used for added reach Step 2 x 10  PWR! Sit to stand x 10  SELF CARE: Provided education to increase independence with ADLs. Provided education on the role of PWR! Moves and correlation to everyday mobility and activities.  THERAPEUTIC ACTIVITIES: To improve functional performance.  Demonstration, verbal and tactile cues throughout for technique. : With 4WW = 8.53 sec W/o AD = 8.84 sec Gait speed: With 5TT = 3.85 ft/sec W/o AD = 3.71 ft/sec   10/09/23 THERAPEUTIC EXERCISE: To improve strength, endurance, ROM, and flexibility.  Demonstration, verbal and tactile cues throughout for technique.  NuStep - L4 x 6 min (UE/LE) S/L L bow & arrow open book stretch 10 x 5 Standing 3-way (60/90/120) doorway pec stretch 2 x 30 each position - best stretch felt at ~90 abduction Seated scap retraction with slight depression 10 x 5  MANUAL THERAPY: To promote normalized muscle  tension, improved flexibility, pain modulation, and reduced pain utilizing connective tissue massage, therapeutic massage, and manual TP therapy. STM/DTM and manual TPR to L UT, LS, rhomboids, subscapularis, teres group, lats and pecs - most TTP in subscapularis and pec minor but able to achieve good muscle relaxation and reduction in pain and muscle tension in all muscles addressed today. L scapular mobilization in R S/L   SELF CARE: Provided education to prevent loss of gains achieved with physical therapy and to prevent future decline in function.  Provided instruction in self-STM techniques to posterior shoulder complex using tennis ball on wall or Theracane.    10/07/23 THERAPEUTIC EXERCISE: To improve strength, endurance, ROM, and flexibility.  Rec Bike L3x6 min  THERAPEUTIC ACTIVITIES: To improve functional performance.  Demonstration, verbal and tactile cues throughout for technique.  LE MMT Goal Assessment for re-auth  Gait for 600 ft w/o SPC- noted gradual decrease in L step length after ~350 ft   MANUAL THERAPY: To promote normalized muscle tension and reduced pain utilizing therapeutic massage, manual TP therapy, and myofascial release.  STM, TrP release to L subscapularis (3 min)   SELF CARE: Provided education on PT POC progression, to reduce fall risk, and to prevent future decline in function. Provided education on community resources related to Parkinson's including support group and educational sessions, community-based PWR! Moves and exercise/movement groups as well as on line resources related to PD.    09/30/23 THERAPEUTIC EXERCISE: To improve strength, endurance, ROM, and flexibility.  Recumbent Bike L3x64min Seated hip abd Blue TB x 20 Seated LAQ 3lb x 20 BLE  NEUROMUSCULAR RE-EDUCATION: To improve coordination, kinesthesia, posture, and proprioception.  Step fwd and opp arm reach x 10 BLE Step fwd and scap retraction w/ weight shift x 10 Standing rows GTB 2 x 10  staggered stance Standing shoulder ext GTB 2x10 staggered stance  Bird dog in standing x 10 B Clock balance x 5 BLE 12 to 6 o'clock Step downs from airex to floor fwd and lateral x 10 B   09/26/23 Nustep L5x69min UE/LE Standing shoulder ext RTB x 20 Seated rows RTB x 20 Standing step and reach x 10 B Lateral step and reach x 10 B Reverse chop RTB 2x10 B  Sit to stand x 20 with yellow weight ball + OHP  Seated LAQ 3lb x 20 BLE   PATIENT EDUCATION:  Education details: PWR! Moves - Standing  Person educated: Patient Education method: Explanation, Demonstration, Verbal cues, and Handouts Education comprehension:  verbalized understanding, returned demonstration, verbal cues required, and needs further education   HOME EXERCISE PROGRAM: Access Code: [email or web address] URL: [email or web address] Date: 10/09/2023 Prepared by: Elijah Hidden  Exercises - Seated Piriformis Stretch  - 2 x daily - 7 x weekly - 2 sets - 2 reps - 30 sec hold - Seated Piriformis Stretch  - 2 x daily - 7 x weekly - 2 sets - 2 reps - 30 sec hold - Standing Hip Abduction with Counter Support  - 1 x daily - 7 x weekly - 2 sets - 10 reps - Standing Hip Extension with Counter Support  - 1 x daily - 7 x weekly - 2 sets - 10 reps - Standing March with Counter Support  - 1 x daily - 7 x weekly - 2 sets - 10 reps - Standing Hip Flexor Stretch with Foot Elevated  - 2 x daily - 7 x weekly - 2 sets - 2 reps - 30 sec hold - Sidelying Thoracic Rotation with Open Book  - 1 x daily - 7 x weekly - 10 reps - 5 sec hold - Doorway Pec Stretch at 90 Degrees Abduction  - 2 x daily - 7 x weekly - 3 reps - 30 sec hold - Seated Scapular Retraction  - 2 x daily - 7 x weekly - 2 sets - 10 reps - 5 sec hold - Theracane Over Shoulder  - 1-2 x daily - 7 x weekly - 1-2 min hold  Patient Education - Tips to reduce freezing episodes with standing or walking  PWR! Moves - Seated - Standing   ASSESSMENT:  CLINICAL  IMPRESSION: [name] [name] reports she has been trying the seated PWR! Moves at home and they don't seem to be bothering her L shoulder pain so reviewed these today and introduced standing PWR! Moves as well.  Some fatigue noted, therefore limited PWR! Moves performance to 1 set in each position but did provide HEP instructions for standing PWR! Moves for home performance.  Progressed scapular strengthening adding RTB resistance for scapular retraction plus rows and shoulder extensions with no pain reported - may consider adding these to HEP in upcoming visits if shoulder pain remains well-controlled.  Session concluded with gait training utilizing SPC versus single or bilateral hiking poles - better upright posture observed with hiking poles although some difficulty with sequencing noted when attempting to use bilateral poles.  Will continue to practice on use of poles to promote better upright posture.  [name] [name] will benefit from continued skilled PT to address ongoing abnormal muscle tension in L shoulder, as well as strength and balance deficits to improve mobility and activity tolerance with decreased pain interference and decreased risk for falls.   EVAL: [name] [name] is a 73 y.o. female who was referred to physical therapy for evaluation and treatment for Parkinson's disease.  She was first diagnosed with Parkinson's in 2017.  She has completed 1 prior PT episode in 2017 within the [facility] system.  Her most recent PT episode for Parkinson's disease was Aug - Dec 2024 at [facility] in [place].  Since her last PT episode, she reports changes in her mobility with worsening of shuffling gait and freezing of gait, resulting in need for increased reliance on 4WW/rollator.  She also notes increased difficulty with bed mobility of late.  Patient presents with physical impairments of decreased timing and coordination of gait, impaired ambulation, impaired standing balance, abnormal posture, bradykinesia with  transfers, impaired activity tolerance, LE weakness, postural  instability and decreased safety awareness impacting safe and independent functional mobility.  Examination revealed patient is at risk for falls and functional decline as evidenced by the following objective test measures: 5xSTS of 10.79 sec (>15 sec indicates increased risk for falls and decreased BLE power), Gait speed of 2.34 ft/sec with 4WW and 2.50 ft/sec w/o AD (2.62 ft/sec is needed for safe community access), TUG of 9.63 sec with 4WW and 10.50 sec w/o AD (>13.5 sec indicates increased risk for falls), TUG Manual of 10.06 sec (difference between TUG manual and TUG >4.5 seconds indicates increased fall risk), and TUG cognitive of 12.66 sec (>/= 15 seconds indicates high risk for falls and community dwelling older adults).  TUG scores of >10% difference indicate difficulty with dual tasking.  Further testing of dynamic gait stability indicated and will be completed on next visit.  ABC scale score of 30% indicates a low level of physical functioning.  [name] [name] will benefit from skilled PT to address above deficits to improve mobility and activity tolerance to help reach the maximal level of functional independence with mobility and gait with reduced risk for falls.  Patient demonstrates understanding of this POC and is in agreement with this plan.   OBJECTIVE IMPAIRMENTS: Abnormal gait, decreased activity tolerance, decreased balance, decreased coordination, decreased knowledge of condition, decreased knowledge of use of DME, decreased mobility, difficulty walking, decreased strength, impaired perceived functional ability, increased muscle spasms, impaired flexibility, improper body mechanics, postural dysfunction, and pain.   ACTIVITY LIMITATIONS: standing, sleeping, stairs, transfers, bed mobility, bathing, locomotion level, and caring for others  PARTICIPATION LIMITATIONS: meal prep, cleaning, laundry, driving, shopping, and community  activity  PERSONAL FACTORS: Fitness, Past/current experiences, Time since onset of injury/illness/exacerbation, and 3+ comorbidities: L common peroneal neuropathy (2021), headache, OA, RLS, h/o back pain are also affecting patient's functional outcome.   REHAB POTENTIAL: Good  CLINICAL DECISION MAKING: Unstable/unpredictable  EVALUATION COMPLEXITY: High   GOALS: Goals reviewed with patient? Yes  SHORT TERM GOALS: Target date: 10/21/2023  Patient will be independent with initial HEP. Baseline:  Goal status: MET - 09/19/23  2.  Patient will demonstrate improvement in overall LE muscle strength by at least 1/2 grade on MMT. Baseline: Refer to above MMT table 10/07/23 - overall B LE strength improving  Goal status: MET - 10/21/23  3.  Patient will be educated on strategies to decrease risk of falls.  Baseline:  Goal status: MET - 09/19/23 - education provided  4.  Patient will verbalize tips to reduce freezing/festination with gait and turns. Baseline:  09/23/23 able to verbalize some of the tips Goal status: MET - 10/07/23 - pt is able to discuss how to overcome freezing episodes and states they have helped  LONG TERM GOALS: Target date: 12/02/2023  Patient will be independent with ongoing/advanced HEP for self-management at home incorporating PWR! Moves as indicated .  Baseline:  Goal status: IN PROGRESS  2.  Patient will be able to ambulate 600' with or w/o LRAD with good safety and without freezing of gait to access community.  Baseline:  Goal status: PARTIALLY MET - 10/07/23 - pt walked w/o cane, noted slightly shorter steps at times on LLE, slowed down w/ walking and talking potentially decreasing safety awareness  3.  Patient will be able to step up/down curb safely with or w/o LRAD for safety with community ambulation.  Baseline:  Goal status: IN PROGRESS - 10/28/23 - worked on proper sequencing with cane or hiking pole with curb ascent/descent  4.  Patient will demonstrate  gait speed of >/= 2.62 ft/sec to be a safe limited community ambulator with decreased risk for recurrent falls.  Baseline: 2.34 ft/sec with 4WW, 2.50 ft/sec w/o AD Goal status: MET - 10/16/23 - With 5TT = 3.85 ft/sec, W/o AD = 3.71 ft/sec  5.  Patient will demonstrate at least a 4 point improvement on FGA to improve gait stability and reduce risk for falls. (MCID = 4 points) Baseline: 19/30 (09/16/23) Goal status: IN PROGRESS - 10/21/23 - 14/30  6.  Patient will report >/= 43% on ABC scale to demonstrate improved balance confidence and decreased risk for falls. Baseline: 480 / 1600 = 30.0 % Goal status: IN PROGRESS - 09/30/23 - 420 / 1600 = 26.3 %  7. Patient will verbalize understanding of local Parkinson's disease community resources, including community fitness post d/c. Baseline:  Goal status: IN PROGRESS - 10/07/23 - information on resources provided to patient today, handouts given on exercise classes for PD    PLAN:  PT FREQUENCY: 2x/week  PT DURATION: 12 weeks  PLANNED INTERVENTIONS: 97164- PT Re-evaluation, [id number]- Physical Performance Testing, 97110-Therapeutic exercises, 97530- Therapeutic activity, 97112- Neuromuscular re-education, 97535- Self Care, [id number]- Manual therapy, [phone]- Gait training, [id number]- Electrical stimulation (unattended), [phone]- Electrical stimulation (manual), [id number]- Ultrasound, [id number]- Ionotophoresis 4mg /ml Dexamethasone , 79439 (1-2 muscles), 20561 (3+ muscles)- Dry Needling, Patient/Family education, Balance training, Stair training, Taping, Joint mobilization, DME instructions, Cryotherapy, and Moist heat  PLAN FOR NEXT SESSION: Review of seated and standing PWR Moves! as needed & introduce additional positions as tolerated; continue to address strategies to reduce freezing with transfers and gait; work on improving foot clearance with gait; progress postural strengthening; rotational movements; further MT to L shoulder complex as needed    Elijah CHRISTELLA Hidden,  PT 10/28/2023, 2:23 PM   Date of referral: 08/18/23 Referring provider: Evonnie Asberry RAMAN, DO Referring diagnosis? G20.A1 (ICD-10-CM) - Parkinson's disease without dyskinesia or fluctuating manifestations (HCC) Treatment diagnosis? (if different than referring diagnosis)  Other abnormalities of gait and mobility  Unsteadiness on feet  Muscle weakness (generalized)  What was this (referring dx) caused by? Ongoing Issue  Lysle of Condition: Chronic (continuous duration > 3 months)   Laterality: Both  Current Functional Measure Score: ABC scale 420 / 1600 = 26.3 %  Objective measurements identify impairments when they are compared to normal values, the uninvolved extremity, and prior level of function.  [x]  Yes  []  No  Objective assessment of functional ability: Moderate functional limitations   Briefly describe symptoms: Deborha reports continued changes in her mobility w/ increased occurrence of freezing episode and shuffling gait since around May. She is still now using a single point cane when walking longer distances d/t decreased confidence, LE strength, and balance during gait. She currently has impairments in timing and balance during gait, impaired static and dynamic balance, bradykinesia with transfers, B LE weakness, postural instability, and dec'd safety awareness which is impacting her independent functional mobility. At this time, there is still a need for further assessment of dynamic gait and balance for fall risks (for both household and community settings) and is to be performed at upcoming visits. Pt's ABC score is currently 26.3% representing low level of physical functioning, which had declined w/ recent inc'd freezing episodes on a more frequent basis.    How did symptoms start: PT first diagnosed in 2017 with current exacerbation of symptoms beginning in April/May of this year with increasing difficulty with mobility noted, in particular getting in and out of  bed and  increased shuffling and freezing of gait.  Average pain intensity:  Last 24 hours: 8/10  Past week: up to 8/10  How often does the pt experience symptoms? Frequently  How much have the symptoms interfered with usual daily activities? Quite a bit  How has condition changed since care began at this facility? A little better  In general, how is the patients overall health? Good  Onset date: PT first diagnosed in 2017 with current exacerbation of symptoms beginning in April/May 2025   BACK PAIN (STarT Back Screening Tool) - (When applicable): N/A  Has your back pain spread down your leg(s) at sometime in the last 2 weeks? []  Yes   []  No Have you had pain in the shoulder or neck at sometime in the past 2 weeks? []  Yes   []  No Have you only walked short distances because of your back pain? []  Yes   []  No In the past 2 weeks, have you dressed more slowly than usual because of your back pain? []  Yes   []  No Do you think it is not really safe for person with a condition like yours to be physically active? []  Yes   []  No Have worrying thoughts been going through your mind a lot of the time? []  Yes   []  No Do you feel that your back pain is terrible and it is never going to get any better? []  Yes   []  No In general, have you stopped enjoying all the things you usually enjoy? []  Yes   []  No Overall, how bothersome has your back pain been in the last 2 weeks? []  Not at all   []  Slightly     []  Moderate   []  Very much     []  Extremely

## 2023-10-30 ENCOUNTER — Ambulatory Visit: Admitting: Physical Therapy

## 2023-10-30 ENCOUNTER — Encounter: Payer: Self-pay | Admitting: Physical Therapy

## 2023-10-30 DIAGNOSIS — R2689 Other abnormalities of gait and mobility: Secondary | ICD-10-CM | POA: Diagnosis not present

## 2023-10-30 DIAGNOSIS — M6281 Muscle weakness (generalized): Secondary | ICD-10-CM

## 2023-10-30 DIAGNOSIS — R2681 Unsteadiness on feet: Secondary | ICD-10-CM | POA: Diagnosis not present

## 2023-10-30 NOTE — Therapy (Signed)
 OUTPATIENT PHYSICAL THERAPY PARKINSON'S TREATMENT     Patient Name: [name] [name] MRN: [id number] DOB:April 15, 1950, 73 y.o., female Today's Date: 10/30/2023   END OF SESSION:  PT End of Session - 10/30/23 1317     Visit Number 12    Date for PT Re-Evaluation 12/02/23    Authorization Type [employer] Medicare    Authorization Time Period 10/07/23 - 11/25/23    Authorization - Visit Number 6    Authorization - Number of Visits 14    PT Start Time 1317    PT Stop Time 1408    PT Time Calculation (min) 51 min    Activity Tolerance Patient tolerated treatment well;No increased pain;Patient limited by fatigue    Behavior During Therapy [facility] for tasks assessed/performed             Past Medical History:  Diagnosis Date   Bursitis    Cellulitis    arm   Common peroneal neuropathy, left 04/27/2019   Headache    Hyperlipidemia    Insomnia    OA (osteoarthritis)    Onychomycosis    Osteoarthritis    Parkinson disease (HCC) 03/23/2015   RLS (restless legs syndrome) 03/23/2015   Tenosynovitis    Past Surgical History:  Procedure Laterality Date   TUBAL LIGATION     Patient Active Problem List   Diagnosis Date Noted   Common peroneal neuropathy, left 04/27/2019   Upper airway cough syndrome 05/03/2015   RLS (restless legs syndrome) 03/23/2015   Parkinson disease (HCC) 03/23/2015   Fever 08/29/2011   Headache 08/29/2011   Knee pain, right 08/29/2011   Back pain 08/29/2011   Bacteremia due to Gram-positive bacteria 08/29/2011   Hepatitis 08/29/2011   Periorbital edema 08/29/2011   Facial rash 08/29/2011   Blurred vision, right eye 08/29/2011   Tremor 08/29/2011   Meningitis 08/29/2011   CHEST PAIN UNSPECIFIED 03/04/2007   PNEUMONIA, LEFT LOWER LOBE 03/03/2007   COUGH, CHRONIC 03/03/2007    PCP: Toribio Jerel MATSU, MD   REFERRING PROVIDER: Evonnie Asberry RAMAN, DO   REFERRING DIAG: G20.A1 (ICD-10-CM) - Parkinson's disease without dyskinesia or fluctuating manifestations  (HCC)  THERAPY DIAG:  Other abnormalities of gait and mobility  Unsteadiness on feet  Muscle weakness (generalized)  RATIONALE FOR EVALUATION AND TREATMENT: Rehabilitation  ONSET DATE: PD diagnosis in January 2017, worsening symptoms since April/May 2025  NEXT MD VISIT: 11/25/23   SUBJECTIVE:  SUBJECTIVE STATEMENT: Pt reports her L shoulder pain was hurting worse again so she has a pain patch on today (denies pain currently).  She states her freezing of gait is bad today.  Pt accompanied by: self - sister Otho) in the waiting room  PAIN: Are you having pain? No and Yes: NPRS scale: 0/10   Pain location: L posterior shoulder  Pain description: sore, agony, tight  Aggravating factors: unpredictable  Relieving factors: stretching, lidocaine  patch   PERTINENT HISTORY:  L common peroneal neuropathy (2021), headache, OA, RLS, h/o back pain  PRECAUTIONS: None  RED FLAGS: None  WEIGHT BEARING RESTRICTIONS: No  FALLS:  Has patient fallen in last 6 months? No  LIVING ENVIRONMENT: Lives with: lives with their spouse or sister (when husband away for work) Lives in: [place] with husband, condo with sister Stairs: Yes: External: 1 steps; none - at her home Has following equipment at home: Single point cane, Environmental consultant - 4 wheeled, shower chair, and Grab bars  OCCUPATION: Retired  PLOF: Independent with household mobility with device, Independent with community mobility with device, Needs assistance with homemaking, and Leisure: scrabble, 4-5 days/week to gym until ~1 month ago  PATIENT GOALS: To be able to walk w/o the cement feeling in my feet - a steady walk w/o shuffling.   OBJECTIVE: (objective measures completed at initial evaluation unless otherwise dated)  DIAGNOSTIC  FINDINGS:  05/01/2022 - MRI cervical spine IMPRESSION:  1. Stable degenerative changes of the cervical spine with mild spinal canal stenosis at C2-3, C3-4 and C5-6.  2. Multilevel neural foraminal narrowing, severe on the right at C5-6 and moderate on the right at C3-4 and C7-T1.   COGNITION: Overall cognitive status: Within functional limits for tasks assessed   SENSATION: WFL Numbness in L great toe (intermittent)  COORDINATION: Gross motor - mild slowing  EDEMA:  Not recently  MUSCLE TONE: LLE: Mild into L ankle inversion  POSTURE:  rounded shoulders, forward head, flexed trunk , and L ankle drawing into inversion  MUSCLE LENGTH: Hamstrings: Mild tight B ITB: Mild tight L>R Piriformis: Mod tight B Hip flexors: Mod/severe tight B Quads: Mild/mod tight B Heelcord: Mild/mod tight B  LOWER EXTREMITY ROM:    Grossly WFL other than restrictions as noted above in muscle length  LOWER EXTREMITY MMT:    MMT Right eval Left eval R 10/07/23 L 10/07/23 R 10/21/23 L 10/21/23  Hip flexion 4 4 4- 4 4+ 4+  Hip extension 3- 3- 3+ 3+ 3+ 3+  Hip abduction 3 3 3+ 3+ 4- 4-  Hip adduction 4- 4- 4- 3+ 4 4  Hip internal rotation 4 4 4+ 4+ 4+ 4+  Hip external rotation 3+ 3+ 4 4 4+ 4+  Knee flexion 4+ 4 4+ 4+ 5 5  Knee extension 4+ 4+ 5 5 5 5   Ankle dorsiflexion 4 4- 4- 4+ 4+ 4+  Ankle plantarflexion 5 4+  5 4+    Ankle inversion 4- 3+ 4 4 4 4   Ankle eversion 4 4- 4 4 4 4   (Blank rows = not tested)  BED MOBILITY:  Sit to supine SBA Supine to sit SBA Rolling to Right SBA Rolling to Left SBA  TRANSFERS: Assistive device utilized: Environmental consultant - 4 wheeled and None  Sit to stand: Modified independence Stand to sit: Modified independence+ Chair to chair: Modified independence Floor: NT  GAIT: Distance walked: Clinic distances Assistive device utilized: Environmental consultant - 4 wheeled and None Level of assistance: Modified independence and SBA Gait  pattern: step through pattern, decreased stride  length, decreased hip/knee flexion- Right, decreased hip/knee flexion- Left, shuffling, and trunk flexed Comments: No freezing demonstrated during initial eval assessment however patient reports increasing episodes of freezing of gait recently  FUNCTIONAL TESTS:  5 times sit to stand: 10.79 sec w/o UE assist Timed up and go (TUG): 9.63 sec with 4WW; 10.50 sec w/o AD (normal), 10.06 sec (manual), 12.66 sec (cognitive - shifted from counting back by 3's to 2's mid test) 10 meter walk test: 14.00 sec with 4WW, 13.10 sec w/o AD Gait speed: 2.34 ft/sec with 4WW, 2.50 ft/sec w/o AD Functional gait assessment: 19/30 (09/16/23) Functional Gait Assessment  Gait assessed  Yes   Gait Level Surface Walks 20 ft, slow speed, abnormal gait pattern, evidence for imbalance or deviates 10-15 in outside of the 12 in walkway width. Requires more than 7 sec to ambulate 20 ft.   Change in Gait Speed Able to change speed, demonstrates mild gait deviations, deviates 6-10 in outside of the 12 in walkway width, or no gait deviations, unable to achieve a major change in velocity, or uses a change in velocity, or uses an assistive device.   Gait with Horizontal Head Turns Performs head turns smoothly with slight change in gait velocity (eg, minor disruption to smooth gait path), deviates 6-10 in outside 12 in walkway width, or uses an assistive device.   Gait with Vertical Head Turns Performs task with slight change in gait velocity (eg, minor disruption to smooth gait path), deviates 6 - 10 in outside 12 in walkway width or uses assistive device   Gait and Pivot Turn Pivot turns safely within 3 sec and stops quickly with no loss of balance.   Step Over Obstacle Is able to step over one shoe box (4.5 in total height) without changing gait speed. No evidence of imbalance.   Gait with Narrow Base of Support Ambulates 7-9 steps.   Gait with Eyes Closed Walks 20 ft, uses assistive device, slower speed, mild gait deviations,  deviates 6-10 in outside 12 in walkway width. Ambulates 20 ft in less than 9 sec but greater than 7 sec.   Ambulating Backwards Walks 20 ft, slow speed, abnormal gait pattern, evidence for imbalance, deviates 10-15 in outside 12 in walkway width.   Steps Alternating feet, must use rail.   Total Score 19      FGA Interpretation of scores: Non-Specific Older Adults Cutoff Score: <=22/30 = risk of falls Parkinson's Disease Cutoff score <15/30 = fall risk (Hoehn & Yahr 1-4) Minimally Clinically Important Difference (MCID)  Stroke (acute, subacute, and chronic) = MDC: 4.2 points Vestibular (acute) = MDC: 6 points Community Dwelling Older Adults =  MCID: 4 points Parkinson's Disease  =  MDC: 4.3 points  PATIENT SURVEYS:  ABC scale: 480 / 1600 = 30.0 %, <69% indicates risk for recurrent falls in PD and <50% indicates a low level of physical functioning; 420 / 1600 = 26.3 % (09/30/23)   TODAY'S TREATMENT:   10/30/2023 THERAPEUTIC EXERCISE: To improve strength, endurance, and promote reciprocal movement patterns.    NuStep - L5 x 6 min (UE/LE to promote reciprocal movement patterns)  GAIT TRAINING: To prepare to wean from AD. 270' w/o AD, initially with CGA of PT via gait belt progressing to SBA with cues for increased step length and reciprocal arm swing  NEUROMUSCULAR RE-EDUCATION: To improve balance, coordination, kinesthesia, posture, proprioception, reduce fall risk, amplitude of movement, speed of movement to reduce bradykinesia, and reduce  rigidity. Sequential reciprocal stepping over 8 low obstables/lines (yardsticks, canes, mop handle, carpet ridge, TB) x 10  PWR! Step forward and back with B UE on backs of 2 chairs to simulate parallel bars x 10 each LE PWR! Step forward w/o UE support x 10 each LE, CGA/SBA of PT via gait belt for safety PWR! Step backward w/o UE support x 10 each LE, CGA/SBA of PT for safety PWR! Step forward and back w/o UE support x 10 each LE, CGA/SBA of PT for  safety - decreased amplitude of backward step on most reps   10/28/2023 THERAPEUTIC EXERCISE: To improve strength and endurance.   Rec Bike - L3 x 6 min  NEUROMUSCULAR RE-EDUCATION: To improve balance, coordination, kinesthesia, posture, proprioception, reduce fall risk, amplitude of movement, speed of movement to reduce bradykinesia, and reduce rigidity. Seated PWR! Moves: Up x 10 Rock x 10 Twist x 10 Step x 10 Standing PWR! Moves: Up x 10 Rock x 10 Twist x 10 Step x 10 Standing RTB scap retraction + B shoulder rows 10 x 3 Standing RTB scap retraction + B shoulder extension 10 x 3  GAIT TRAINING: To normalize gait pattern, improve safety with SPC vs hiking poles, and prepare to wean to LRAD.  180' with SPC on R - cues for sequencing of SPC to promote reciprocal gait pattern including reciprocal L arm swing Curb negotiation with SPC on R and SBA of PT with VC's for sequencing 180' with hiking pole in R hand - cues for sequencing of pole to promote reciprocal gait pattern including reciprocal L arm swing 90' with B hiking poles - cues for sequencing of poles to promote reciprocal gait pattern  Curb negotiation with B hiking poles and SBA of PT with VC's for sequencing   10/21/2023 THERAPEUTIC EXERCISE: To improve strength and endurance.  Demonstration, verbal and tactile cues throughout for technique.  Rec Bike - L3 x 6 min  THERAPEUTIC ACTIVITIES: To improve functional performance.  Demonstration, verbal and tactile cues throughout for technique. LE MMT  Goal assessment  PHYSICAL PERFORMANCE TEST or MEASUREMENT: Functional Gait  Assessment  Gait Level Surface Walks 20 ft, slow speed, abnormal gait pattern, evidence for imbalance or deviates 10-15 in outside of the 12 in walkway width. Requires more than 7 sec to ambulate 20 ft.   Change in Gait Speed Able to change speed, demonstrates mild gait deviations, deviates 6-10 in outside of the 12 in walkway width, or no gait  deviations, unable to achieve a major change in velocity, or uses a change in velocity, or uses an assistive device.   Gait with Horizontal Head Turns Performs head turns smoothly with slight change in gait velocity (eg, minor disruption to smooth gait path), deviates 6-10 in outside 12 in walkway width, or uses an assistive device.   Gait with Vertical Head Turns Performs task with slight change in gait velocity (eg, minor disruption to smooth gait path), deviates 6 - 10 in outside 12 in walkway width or uses assistive device   Gait and Pivot Turn Pivot turns safely in greater than 3 sec and stops with no loss of balance, or pivot turns safely within 3 sec and stops with mild imbalance, requires small steps to catch balance.   Step Over Obstacle Is able to step over one shoe box (4.5 in total height) but must slow down and adjust steps to clear box safely. May require verbal cueing.   Gait with Narrow Base of Support Ambulates less  than 4 steps heel to toe or cannot perform without assistance.   Gait with Eyes Closed Walks 20 ft, slow speed, abnormal gait pattern, evidence for imbalance, deviates 10-15 in outside 12 in walkway width. Requires more than 9 sec to ambulate 20 ft.   Ambulating Backwards Walks 20 ft, slow speed, abnormal gait pattern, evidence for imbalance, deviates 10-15 in outside 12 in walkway width.   Steps Alternating feet, must use rail.   Total Score 14   FGA comment: < 19 = high risk fall      FGA assessment significantly limited by increased freezing of gait today  SELF CARE:  Reviewed education on tips to prevent freezing with transfers and gait, incorporating postural reset, deep breaths and count to 5, weight shifting/marching in place, and use of visual cues to increase step length to help work through freezing episodes.    10/16/2023  NEUROMUSCULAR RE-EDUCATION: To improve balance, coordination, kinesthesia, posture, proprioception, reduce fall risk, amplitude of  movement, speed of movement to reduce bradykinesia, and reduce rigidity.  Seated PWR! Moves: Up 2 x 10 Rock 2 x 10 Twist 2 x 10 - boom whackers (purple) used for added reach Step 2 x 10  PWR! Sit to stand x 10  SELF CARE: Provided education to increase independence with ADLs. Provided education on the role of PWR! Moves and correlation to everyday mobility and activities.  THERAPEUTIC ACTIVITIES: To improve functional performance.  Demonstration, verbal and tactile cues throughout for technique. : With 4WW = 8.53 sec W/o AD = 8.84 sec Gait speed: With 5TT = 3.85 ft/sec W/o AD = 3.71 ft/sec   10/09/23 THERAPEUTIC EXERCISE: To improve strength, endurance, ROM, and flexibility.  Demonstration, verbal and tactile cues throughout for technique.  NuStep - L4 x 6 min (UE/LE) S/L L bow & arrow open book stretch 10 x 5 Standing 3-way (60/90/120) doorway pec stretch 2 x 30 each position - best stretch felt at ~90 abduction Seated scap retraction with slight depression 10 x 5  MANUAL THERAPY: To promote normalized muscle tension, improved flexibility, pain modulation, and reduced pain utilizing connective tissue massage, therapeutic massage, and manual TP therapy. STM/DTM and manual TPR to L UT, LS, rhomboids, subscapularis, teres group, lats and pecs - most TTP in subscapularis and pec minor but able to achieve good muscle relaxation and reduction in pain and muscle tension in all muscles addressed today. L scapular mobilization in R S/L   SELF CARE: Provided education to prevent loss of gains achieved with physical therapy and to prevent future decline in function.  Provided instruction in self-STM techniques to posterior shoulder complex using tennis ball on wall or Theracane.    10/07/23 THERAPEUTIC EXERCISE: To improve strength, endurance, ROM, and flexibility.  Rec Bike L3x6 min  THERAPEUTIC ACTIVITIES: To improve functional performance.  Demonstration, verbal and tactile  cues throughout for technique.  LE MMT Goal Assessment for re-auth  Gait for 600 ft w/o SPC- noted gradual decrease in L step length after ~350 ft   MANUAL THERAPY: To promote normalized muscle tension and reduced pain utilizing therapeutic massage, manual TP therapy, and myofascial release.  STM, TrP release to L subscapularis (3 min)   SELF CARE: Provided education on PT POC progression, to reduce fall risk, and to prevent future decline in function. Provided education on community resources related to Parkinson's including support group and educational sessions, community-based PWR! Moves and exercise/movement groups as well as on line resources related to PD.    09/30/23  THERAPEUTIC EXERCISE: To improve strength, endurance, ROM, and flexibility.  Recumbent Bike L3x23min Seated hip abd Blue TB x 20 Seated LAQ 3lb x 20 BLE  NEUROMUSCULAR RE-EDUCATION: To improve coordination, kinesthesia, posture, and proprioception.  Step fwd and opp arm reach x 10 BLE Step fwd and scap retraction w/ weight shift x 10 Standing rows GTB 2 x 10 staggered stance Standing shoulder ext GTB 2x10 staggered stance  Bird dog in standing x 10 B Clock balance x 5 BLE 12 to 6 o'clock Step downs from airex to floor fwd and lateral x 10 B   09/26/23 Nustep L5x63min UE/LE Standing shoulder ext RTB x 20 Seated rows RTB x 20 Standing step and reach x 10 B Lateral step and reach x 10 B Reverse chop RTB 2x10 B  Sit to stand x 20 with yellow weight ball + OHP  Seated LAQ 3lb x 20 BLE   PATIENT EDUCATION:  Education details: PWR! Moves - Seated  Person educated: Patient Education method: Explanation, Demonstration, Verbal cues, and Handouts Education comprehension: verbalized understanding, returned demonstration, verbal cues required, and needs further education   HOME EXERCISE PROGRAM: Access Code: [email or web address] URL: [email or web address] Date: 10/09/2023 Prepared by: Elijah Hidden  Exercises -  Seated Piriformis Stretch  - 2 x daily - 7 x weekly - 2 sets - 2 reps - 30 sec hold - Seated Piriformis Stretch  - 2 x daily - 7 x weekly - 2 sets - 2 reps - 30 sec hold - Standing Hip Abduction with Counter Support  - 1 x daily - 7 x weekly - 2 sets - 10 reps - Standing Hip Extension with Counter Support  - 1 x daily - 7 x weekly - 2 sets - 10 reps - Standing March with Counter Support  - 1 x daily - 7 x weekly - 2 sets - 10 reps - Standing Hip Flexor Stretch with Foot Elevated  - 2 x daily - 7 x weekly - 2 sets - 2 reps - 30 sec hold - Sidelying Thoracic Rotation with Open Book  - 1 x daily - 7 x weekly - 10 reps - 5 sec hold - Doorway Pec Stretch at 90 Degrees Abduction  - 2 x daily - 7 x weekly - 3 reps - 30 sec hold - Seated Scapular Retraction  - 2 x daily - 7 x weekly - 2 sets - 10 reps - 5 sec hold - Theracane Over Shoulder  - 1-2 x daily - 7 x weekly - 1-2 min hold  Patient Education - Tips to reduce freezing episodes with standing or walking  PWR! Moves - Seated - Standing   ASSESSMENT:  CLINICAL IMPRESSION: Idalis demonstrated increased freezing episodes with gait today creating significant difficulty with initiation of stepping pattern.  Reviewed tips to reduce freezing of gait and worked on strategies to promote initiation of movement and increased stride length.  Patient seemed to respond best to visual cues using lines between tiles or changes of surface on floor as a visual target to promote initiation of step and increase stride length - she was able to complete sequential reciprocal step overs using low height obstacles such as yard sticks and canes w/o significant freezing of gait and was even able to increase reciprocal arm swing on latter repetitions.  Utilized PWR! Step pattern and forward and back motions to promote increased stride length and reinforced reciprocal movement patterns.  Kilee will benefit from continued skilled PT to address ongoing abnormal muscle  tension  in L shoulder, as well as strength and balance deficits to improve mobility and activity tolerance with decreased pain interference, decreased episodes of freezing and decreased risk for falls.   EVAL: [name] [name] is a 73 y.o. female who was referred to physical therapy for evaluation and treatment for Parkinson's disease.  She was first diagnosed with Parkinson's in 2017.  She has completed 1 prior PT episode in 2017 within the [facility] system.  Her most recent PT episode for Parkinson's disease was Aug - Dec 2024 at [facility] in [place].  Since her last PT episode, she reports changes in her mobility with worsening of shuffling gait and freezing of gait, resulting in need for increased reliance on 4WW/rollator.  She also notes increased difficulty with bed mobility of late.  Patient presents with physical impairments of decreased timing and coordination of gait, impaired ambulation, impaired standing balance, abnormal posture, bradykinesia with transfers, impaired activity tolerance, LE weakness, postural instability and decreased safety awareness impacting safe and independent functional mobility.  Examination revealed patient is at risk for falls and functional decline as evidenced by the following objective test measures: 5xSTS of 10.79 sec (>15 sec indicates increased risk for falls and decreased BLE power), Gait speed of 2.34 ft/sec with 4WW and 2.50 ft/sec w/o AD (2.62 ft/sec is needed for safe community access), TUG of 9.63 sec with 4WW and 10.50 sec w/o AD (>13.5 sec indicates increased risk for falls), TUG Manual of 10.06 sec (difference between TUG manual and TUG >4.5 seconds indicates increased fall risk), and TUG cognitive of 12.66 sec (>/= 15 seconds indicates high risk for falls and community dwelling older adults).  TUG scores of >10% difference indicate difficulty with dual tasking.  Further testing of dynamic gait stability indicated and will be completed on next visit.  ABC scale  score of 30% indicates a low level of physical functioning.  Tiandra will benefit from skilled PT to address above deficits to improve mobility and activity tolerance to help reach the maximal level of functional independence with mobility and gait with reduced risk for falls.  Patient demonstrates understanding of this POC and is in agreement with this plan.   OBJECTIVE IMPAIRMENTS: Abnormal gait, decreased activity tolerance, decreased balance, decreased coordination, decreased knowledge of condition, decreased knowledge of use of DME, decreased mobility, difficulty walking, decreased strength, impaired perceived functional ability, increased muscle spasms, impaired flexibility, improper body mechanics, postural dysfunction, and pain.   ACTIVITY LIMITATIONS: standing, sleeping, stairs, transfers, bed mobility, bathing, locomotion level, and caring for others  PARTICIPATION LIMITATIONS: meal prep, cleaning, laundry, driving, shopping, and community activity  PERSONAL FACTORS: Fitness, Past/current experiences, Time since onset of injury/illness/exacerbation, and 3+ comorbidities: L common peroneal neuropathy (2021), headache, OA, RLS, h/o back pain are also affecting patient's functional outcome.   REHAB POTENTIAL: Good  CLINICAL DECISION MAKING: Unstable/unpredictable  EVALUATION COMPLEXITY: High   GOALS: Goals reviewed with patient? Yes  SHORT TERM GOALS: Target date: 10/21/2023  Patient will be independent with initial HEP. Baseline:  Goal status: MET - 09/19/23  2.  Patient will demonstrate improvement in overall LE muscle strength by at least 1/2 grade on MMT. Baseline: Refer to above MMT table 10/07/23 - overall B LE strength improving  Goal status: MET - 10/21/23  3.  Patient will be educated on strategies to decrease risk of falls.  Baseline:  Goal status: MET - 09/19/23 - education provided  4.  Patient will verbalize tips to reduce freezing/festination with gait  and  turns. Baseline:  09/23/23 able to verbalize some of the tips Goal status: MET - 10/07/23 - pt is able to discuss how to overcome freezing episodes and states they have helped  LONG TERM GOALS: Target date: 12/02/2023  Patient will be independent with ongoing/advanced HEP for self-management at home incorporating PWR! Moves as indicated .  Baseline:  Goal status: IN PROGRESS  2.  Patient will be able to ambulate 600' with or w/o LRAD with good safety and without freezing of gait to access community.  Baseline:  Goal status: PARTIALLY MET - 10/07/23 - pt walked w/o cane, noted slightly shorter steps at times on LLE, slowed down w/ walking and talking potentially decreasing safety awareness  3.  Patient will be able to step up/down curb safely with or w/o LRAD for safety with community ambulation.  Baseline:  Goal status: IN PROGRESS - 10/28/23 - worked on proper sequencing with cane or hiking pole with curb ascent/descent  4.  Patient will demonstrate gait speed of >/= 2.62 ft/sec to be a safe limited community ambulator with decreased risk for recurrent falls.  Baseline: 2.34 ft/sec with 4WW, 2.50 ft/sec w/o AD Goal status: MET - 10/16/23 - With 5TT = 3.85 ft/sec, W/o AD = 3.71 ft/sec  5.  Patient will demonstrate at least a 4 point improvement on FGA to improve gait stability and reduce risk for falls. (MCID = 4 points) Baseline: 19/30 (09/16/23) Goal status: IN PROGRESS - 10/21/23 - 14/30  6.  Patient will report >/= 43% on ABC scale to demonstrate improved balance confidence and decreased risk for falls. Baseline: 480 / 1600 = 30.0 % Goal status: IN PROGRESS - 09/30/23 - 420 / 1600 = 26.3 %  7. Patient will verbalize understanding of local Parkinson's disease community resources, including community fitness post d/c. Baseline:  Goal status: IN PROGRESS - 10/07/23 - information on resources provided to patient today, handouts given on exercise classes for PD    PLAN:  PT FREQUENCY:  2x/week  PT DURATION: 12 weeks  PLANNED INTERVENTIONS: 97164- PT Re-evaluation, 97750- Physical Performance Testing, 97110-Therapeutic exercises, 97530- Therapeutic activity, 97112- Neuromuscular re-education, 97535- Self Care, 02859- Manual therapy, [phone]- Gait training, [phone]- Electrical stimulation (unattended), [phone]- Electrical stimulation (manual), [id number]- Ultrasound, [id number]- Ionotophoresis 4mg /ml Dexamethasone , 79439 (1-2 muscles), 20561 (3+ muscles)- Dry Needling, Patient/Family education, Balance training, Stair training, Taping, Joint mobilization, DME instructions, Cryotherapy, and Moist heat  PLAN FOR NEXT SESSION: Review of seated and standing PWR Moves! as needed & introduce additional positions as tolerated; continue to address strategies to reduce freezing with transfers and gait; work on improving foot clearance with gait; progress postural strengthening; rotational movements; further MT to L shoulder complex as needed    Elijah CHRISTELLA Hidden, PT 10/30/2023, 1:23 PM    Date of referral: 08/18/23 Referring provider: Evonnie Asberry RAMAN, DO Referring diagnosis? G20.A1 (ICD-10-CM) - Parkinson's disease without dyskinesia or fluctuating manifestations (HCC) Treatment diagnosis? (if different than referring diagnosis)  Other abnormalities of gait and mobility  Unsteadiness on feet  Muscle weakness (generalized)  What was this (referring dx) caused by? Ongoing Issue  Lysle of Condition: Chronic (continuous duration > 3 months)   Laterality: Both  Current Functional Measure Score: ABC scale 420 / 1600 = 26.3 %  Objective measurements identify impairments when they are compared to normal values, the uninvolved extremity, and prior level of function.  [x]  Yes  []  No  Objective assessment of functional ability: Moderate functional limitations   Briefly describe symptoms:  Teddi reports continued changes in her mobility w/ increased occurrence of freezing episode and shuffling gait since  around May. She is still now using a single point cane when walking longer distances d/t decreased confidence, LE strength, and balance during gait. She currently has impairments in timing and balance during gait, impaired static and dynamic balance, bradykinesia with transfers, B LE weakness, postural instability, and dec'd safety awareness which is impacting her independent functional mobility. At this time, there is still a need for further assessment of dynamic gait and balance for fall risks (for both household and community settings) and is to be performed at upcoming visits. Pt's ABC score is currently 26.3% representing low level of physical functioning, which had declined w/ recent inc'd freezing episodes on a more frequent basis.    How did symptoms start: PT first diagnosed in 2017 with current exacerbation of symptoms beginning in April/May of this year with increasing difficulty with mobility noted, in particular getting in and out of bed and increased shuffling and freezing of gait.  Average pain intensity:  Last 24 hours: 8/10  Past week: up to 8/10  How often does the pt experience symptoms? Frequently  How much have the symptoms interfered with usual daily activities? Quite a bit  How has condition changed since care began at this facility? A little better  In general, how is the patients overall health? Good  Onset date: PT first diagnosed in 2017 with current exacerbation of symptoms beginning in April/May 2025   BACK PAIN (STarT Back Screening Tool) - (When applicable): N/A  Has your back pain spread down your leg(s) at sometime in the last 2 weeks? []  Yes   []  No Have you had pain in the shoulder or neck at sometime in the past 2 weeks? []  Yes   []  No Have you only walked short distances because of your back pain? []  Yes   []  No In the past 2 weeks, have you dressed more slowly than usual because of your back pain? []  Yes   []  No Do you think it is not really safe  for person with a condition like yours to be physically active? []  Yes   []  No Have worrying thoughts been going through your mind a lot of the time? []  Yes   []  No Do you feel that your back pain is terrible and it is never going to get any better? []  Yes   []  No In general, have you stopped enjoying all the things you usually enjoy? []  Yes   []  No Overall, how bothersome has your back pain been in the last 2 weeks? []  Not at all   []  Slightly     []  Moderate   []  Very much     []  Extremely

## 2023-11-03 ENCOUNTER — Other Ambulatory Visit: Payer: Self-pay | Admitting: Neurology

## 2023-11-04 ENCOUNTER — Telehealth: Payer: Self-pay | Admitting: Neurology

## 2023-11-04 DIAGNOSIS — G20A1 Parkinson's disease without dyskinesia, without mention of fluctuations: Secondary | ICD-10-CM

## 2023-11-04 DIAGNOSIS — G2581 Restless legs syndrome: Secondary | ICD-10-CM

## 2023-11-04 NOTE — Telephone Encounter (Signed)
 Pt called and LM. She needs a refill sent to [facility] in [place]. She said tat has never filled this for her. [facility] was the one that started her on this.  Ropinirole  2mg . It was 1mg  but it was changed to 2mg 

## 2023-11-05 ENCOUNTER — Other Ambulatory Visit: Payer: Self-pay

## 2023-11-05 ENCOUNTER — Ambulatory Visit

## 2023-11-05 DIAGNOSIS — R2681 Unsteadiness on feet: Secondary | ICD-10-CM

## 2023-11-05 DIAGNOSIS — G2581 Restless legs syndrome: Secondary | ICD-10-CM

## 2023-11-05 DIAGNOSIS — M6281 Muscle weakness (generalized): Secondary | ICD-10-CM

## 2023-11-05 DIAGNOSIS — R2689 Other abnormalities of gait and mobility: Secondary | ICD-10-CM | POA: Diagnosis not present

## 2023-11-05 DIAGNOSIS — G20A1 Parkinson's disease without dyskinesia, without mention of fluctuations: Secondary | ICD-10-CM

## 2023-11-05 MED ORDER — ROPINIROLE HCL 1 MG PO TABS: 1.0000 mg | ORAL_TABLET | Freq: Three times a day (TID) | ORAL | 0 refills | Status: DC

## 2023-11-05 NOTE — Addendum Note (Signed)
 Addended by: TERRIL CHARLIES MATSU on: 11/05/2023 10:20 AM   Modules accepted: Orders

## 2023-11-05 NOTE — Therapy (Signed)
 OUTPATIENT PHYSICAL THERAPY PARKINSON'S TREATMENT     Patient Name: [name] [name] MRN: [id number] DOB:06-Oct-1950, 73 y.o., female Today's Date: 11/05/2023   END OF SESSION:  PT End of Session - 11/05/23 1447     Visit Number 13    Date for PT Re-Evaluation 12/02/23    Authorization Type [employer]    Authorization Time Period 10/07/23 - 11/25/23    Authorization - Visit Number 7    Authorization - Number of Visits 14    PT Start Time 1403    PT Stop Time 1445    PT Time Calculation (min) 42 min    Activity Tolerance Patient tolerated treatment well;No increased pain    Behavior During Therapy [facility] for tasks assessed/performed              Past Medical History:  Diagnosis Date   Bursitis    Cellulitis    arm   Common peroneal neuropathy, left 04/27/2019   Headache    Hyperlipidemia    Insomnia    OA (osteoarthritis)    Onychomycosis    Osteoarthritis    Parkinson disease (HCC) 03/23/2015   RLS (restless legs syndrome) 03/23/2015   Tenosynovitis    Past Surgical History:  Procedure Laterality Date   TUBAL LIGATION     Patient Active Problem List   Diagnosis Date Noted   Common peroneal neuropathy, left 04/27/2019   Upper airway cough syndrome 05/03/2015   RLS (restless legs syndrome) 03/23/2015   Parkinson disease (HCC) 03/23/2015   Fever 08/29/2011   Headache 08/29/2011   Knee pain, right 08/29/2011   Back pain 08/29/2011   Bacteremia due to Gram-positive bacteria 08/29/2011   Hepatitis 08/29/2011   Periorbital edema 08/29/2011   Facial rash 08/29/2011   Blurred vision, right eye 08/29/2011   Tremor 08/29/2011   Meningitis 08/29/2011   CHEST PAIN UNSPECIFIED 03/04/2007   PNEUMONIA, LEFT LOWER LOBE 03/03/2007   COUGH, CHRONIC 03/03/2007    PCP: Toribio Jerel MATSU, MD   REFERRING PROVIDER: Evonnie Asberry RAMAN, DO   REFERRING DIAG: G20.A1 (ICD-10-CM) - Parkinson's disease without dyskinesia or fluctuating manifestations (HCC)  THERAPY DIAG:  Other  abnormalities of gait and mobility  Unsteadiness on feet  Muscle weakness (generalized)  RATIONALE FOR EVALUATION AND TREATMENT: Rehabilitation  ONSET DATE: PD diagnosis in January 2017, worsening symptoms since April/May 2025  NEXT MD VISIT: 11/25/23   SUBJECTIVE:  SUBJECTIVE STATEMENT: No shoulder pain, no recent falls or near falls  Pt accompanied by: self - sister (Reecia) in the waiting room  PAIN: Are you having pain? No and Yes: NPRS scale: 0/10   Pain location: L posterior shoulder  Pain description: sore, agony, tight  Aggravating factors: unpredictable  Relieving factors: stretching, lidocaine  patch   PERTINENT HISTORY:  L common peroneal neuropathy (2021), headache, OA, RLS, h/o back pain  PRECAUTIONS: None  RED FLAGS: None  WEIGHT BEARING RESTRICTIONS: No  FALLS:  Has patient fallen in last 6 months? No  LIVING ENVIRONMENT: Lives with: lives with their spouse or sister (when husband away for work) Lives in: [place] with husband, condo with sister Stairs: Yes: External: 1 steps; none - at her home Has following equipment at home: Single point cane, Environmental consultant - 4 wheeled, shower chair, and Grab bars  OCCUPATION: Retired  PLOF: Independent with household mobility with device, Independent with community mobility with device, Needs assistance with homemaking, and Leisure: scrabble, 4-5 days/week to gym until ~1 month ago  PATIENT GOALS: To be able to walk w/o the cement feeling in my feet - a steady walk w/o shuffling.   OBJECTIVE: (objective measures completed at initial evaluation unless otherwise dated)  DIAGNOSTIC FINDINGS:  05/01/2022 - MRI cervical spine IMPRESSION:  1. Stable degenerative changes of the cervical spine with mild spinal canal stenosis at  C2-3, C3-4 and C5-6.  2. Multilevel neural foraminal narrowing, severe on the right at C5-6 and moderate on the right at C3-4 and C7-T1.   COGNITION: Overall cognitive status: Within functional limits for tasks assessed   SENSATION: WFL Numbness in L great toe (intermittent)  COORDINATION: Gross motor - mild slowing  EDEMA:  Not recently  MUSCLE TONE: LLE: Mild into L ankle inversion  POSTURE:  rounded shoulders, forward head, flexed trunk , and L ankle drawing into inversion  MUSCLE LENGTH: Hamstrings: Mild tight B ITB: Mild tight L>R Piriformis: Mod tight B Hip flexors: Mod/severe tight B Quads: Mild/mod tight B Heelcord: Mild/mod tight B  LOWER EXTREMITY ROM:    Grossly WFL other than restrictions as noted above in muscle length  LOWER EXTREMITY MMT:    MMT Right eval Left eval R 10/07/23 L 10/07/23 R 10/21/23 L 10/21/23  Hip flexion 4 4 4- 4 4+ 4+  Hip extension 3- 3- 3+ 3+ 3+ 3+  Hip abduction 3 3 3+ 3+ 4- 4-  Hip adduction 4- 4- 4- 3+ 4 4  Hip internal rotation 4 4 4+ 4+ 4+ 4+  Hip external rotation 3+ 3+ 4 4 4+ 4+  Knee flexion 4+ 4 4+ 4+ 5 5  Knee extension 4+ 4+ 5 5 5 5   Ankle dorsiflexion 4 4- 4- 4+ 4+ 4+  Ankle plantarflexion 5 4+  5 4+    Ankle inversion 4- 3+ 4 4 4 4   Ankle eversion 4 4- 4 4 4 4   (Blank rows = not tested)  BED MOBILITY:  Sit to supine SBA Supine to sit SBA Rolling to Right SBA Rolling to Left SBA  TRANSFERS: Assistive device utilized: Environmental consultant - 4 wheeled and None  Sit to stand: Modified independence Stand to sit: Modified independence+ Chair to chair: Modified independence Floor: NT  GAIT: Distance walked: Clinic distances Assistive device utilized: Environmental consultant - 4 wheeled and None Level of assistance: Modified independence and SBA Gait pattern: step through pattern, decreased stride length, decreased hip/knee flexion- Right, decreased hip/knee flexion- Left, shuffling, and trunk flexed Comments: No freezing  demonstrated during  initial eval assessment however patient reports increasing episodes of freezing of gait recently  FUNCTIONAL TESTS:  5 times sit to stand: 10.79 sec w/o UE assist Timed up and go (TUG): 9.63 sec with 4WW; 10.50 sec w/o AD (normal), 10.06 sec (manual), 12.66 sec (cognitive - shifted from counting back by 3's to 2's mid test) 10 meter walk test: 14.00 sec with 4WW, 13.10 sec w/o AD Gait speed: 2.34 ft/sec with 4WW, 2.50 ft/sec w/o AD Functional gait assessment: 19/30 (09/16/23) Functional Gait Assessment  Gait assessed  Yes   Gait Level Surface Walks 20 ft, slow speed, abnormal gait pattern, evidence for imbalance or deviates 10-15 in outside of the 12 in walkway width. Requires more than 7 sec to ambulate 20 ft.   Change in Gait Speed Able to change speed, demonstrates mild gait deviations, deviates 6-10 in outside of the 12 in walkway width, or no gait deviations, unable to achieve a major change in velocity, or uses a change in velocity, or uses an assistive device.   Gait with Horizontal Head Turns Performs head turns smoothly with slight change in gait velocity (eg, minor disruption to smooth gait path), deviates 6-10 in outside 12 in walkway width, or uses an assistive device.   Gait with Vertical Head Turns Performs task with slight change in gait velocity (eg, minor disruption to smooth gait path), deviates 6 - 10 in outside 12 in walkway width or uses assistive device   Gait and Pivot Turn Pivot turns safely within 3 sec and stops quickly with no loss of balance.   Step Over Obstacle Is able to step over one shoe box (4.5 in total height) without changing gait speed. No evidence of imbalance.   Gait with Narrow Base of Support Ambulates 7-9 steps.   Gait with Eyes Closed Walks 20 ft, uses assistive device, slower speed, mild gait deviations, deviates 6-10 in outside 12 in walkway width. Ambulates 20 ft in less than 9 sec but greater than 7 sec.   Ambulating Backwards Walks 20 ft, slow speed,  abnormal gait pattern, evidence for imbalance, deviates 10-15 in outside 12 in walkway width.   Steps Alternating feet, must use rail.   Total Score 19      FGA Interpretation of scores: Non-Specific Older Adults Cutoff Score: <=22/30 = risk of falls Parkinson's Disease Cutoff score <15/30 = fall risk (Hoehn & Yahr 1-4) Minimally Clinically Important Difference (MCID)  Stroke (acute, subacute, and chronic) = MDC: 4.2 points Vestibular (acute) = MDC: 6 points Community Dwelling Older Adults =  MCID: 4 points Parkinson's Disease  =  MDC: 4.3 points  PATIENT SURVEYS:  ABC scale: 480 / 1600 = 30.0 %, <69% indicates risk for recurrent falls in PD and <50% indicates a low level of physical functioning; 420 / 1600 = 26.3 % (09/30/23)   TODAY'S TREATMENT:  11/05/2023 THERAPEUTIC EXERCISE: To improve strength, endurance, and promote reciprocal movement patterns.    NuStep - L5 x 6 min (UE/LE to promote reciprocal movement patterns) Trunk extension at wall  NEUROMUSCULAR RE-EDUCATION: To improve balance, coordination, kinesthesia, posture, proprioception, reduce fall risk, amplitude of movement, speed of movement to reduce bradykinesia, and reduce rigidity. PWR! Step forward w/o UE support 2 x 10 each LE,  PWR! Step backward w/o UE support 2 x 10 each LE,  PWR! Step laterally w/ weight shift 2 x 10 BLE PWR! Twist standing x 10 each way Standing D1 ext GTB R/L UE with trunk rotation x  10  10/30/2023 THERAPEUTIC EXERCISE: To improve strength, endurance, and promote reciprocal movement patterns.    NuStep - L5 x 6 min (UE/LE to promote reciprocal movement patterns)  GAIT TRAINING: To prepare to wean from AD. 270' w/o AD, initially with CGA of PT via gait belt progressing to SBA with cues for increased step length and reciprocal arm swing  NEUROMUSCULAR RE-EDUCATION: To improve balance, coordination, kinesthesia, posture, proprioception, reduce fall risk, amplitude of movement, speed of movement  to reduce bradykinesia, and reduce rigidity. Sequential reciprocal stepping over 8 low obstables/lines (yardsticks, canes, mop handle, carpet ridge, TB) x 10  PWR! Step forward and back with B UE on backs of 2 chairs to simulate parallel bars x 10 each LE PWR! Step forward w/o UE support x 10 each LE, CGA/SBA of PT via gait belt for safety PWR! Step backward w/o UE support x 10 each LE, CGA/SBA of PT for safety PWR! Step forward and back w/o UE support x 10 each LE, CGA/SBA of PT for safety - decreased amplitude of backward step on most reps   10/28/2023 THERAPEUTIC EXERCISE: To improve strength and endurance.   Rec Bike - L3 x 6 min  NEUROMUSCULAR RE-EDUCATION: To improve balance, coordination, kinesthesia, posture, proprioception, reduce fall risk, amplitude of movement, speed of movement to reduce bradykinesia, and reduce rigidity. Seated PWR! Moves: Up x 10 Rock x 10 Twist x 10 Step x 10 Standing PWR! Moves: Up x 10 Rock x 10 Twist x 10 Step x 10 Standing RTB scap retraction + B shoulder rows 10 x 3 Standing RTB scap retraction + B shoulder extension 10 x 3  GAIT TRAINING: To normalize gait pattern, improve safety with SPC vs hiking poles, and prepare to wean to LRAD.  180' with SPC on R - cues for sequencing of SPC to promote reciprocal gait pattern including reciprocal L arm swing Curb negotiation with SPC on R and SBA of PT with VC's for sequencing 180' with hiking pole in R hand - cues for sequencing of pole to promote reciprocal gait pattern including reciprocal L arm swing 90' with B hiking poles - cues for sequencing of poles to promote reciprocal gait pattern  Curb negotiation with B hiking poles and SBA of PT with VC's for sequencing   10/21/2023 THERAPEUTIC EXERCISE: To improve strength and endurance.  Demonstration, verbal and tactile cues throughout for technique.  Rec Bike - L3 x 6 min  THERAPEUTIC ACTIVITIES: To improve functional performance.  Demonstration,  verbal and tactile cues throughout for technique. LE MMT  Goal assessment  PHYSICAL PERFORMANCE TEST or MEASUREMENT: Functional Gait  Assessment  Gait Level Surface Walks 20 ft, slow speed, abnormal gait pattern, evidence for imbalance or deviates 10-15 in outside of the 12 in walkway width. Requires more than 7 sec to ambulate 20 ft.   Change in Gait Speed Able to change speed, demonstrates mild gait deviations, deviates 6-10 in outside of the 12 in walkway width, or no gait deviations, unable to achieve a major change in velocity, or uses a change in velocity, or uses an assistive device.   Gait with Horizontal Head Turns Performs head turns smoothly with slight change in gait velocity (eg, minor disruption to smooth gait path), deviates 6-10 in outside 12 in walkway width, or uses an assistive device.   Gait with Vertical Head Turns Performs task with slight change in gait velocity (eg, minor disruption to smooth gait path), deviates 6 - 10 in outside 12 in walkway  width or uses assistive device   Gait and Pivot Turn Pivot turns safely in greater than 3 sec and stops with no loss of balance, or pivot turns safely within 3 sec and stops with mild imbalance, requires small steps to catch balance.   Step Over Obstacle Is able to step over one shoe box (4.5 in total height) but must slow down and adjust steps to clear box safely. May require verbal cueing.   Gait with Narrow Base of Support Ambulates less than 4 steps heel to toe or cannot perform without assistance.   Gait with Eyes Closed Walks 20 ft, slow speed, abnormal gait pattern, evidence for imbalance, deviates 10-15 in outside 12 in walkway width. Requires more than 9 sec to ambulate 20 ft.   Ambulating Backwards Walks 20 ft, slow speed, abnormal gait pattern, evidence for imbalance, deviates 10-15 in outside 12 in walkway width.   Steps Alternating feet, must use rail.   Total Score 14   FGA comment: < 19 = high risk fall      FGA  assessment significantly limited by increased freezing of gait today  SELF CARE:  Reviewed education on tips to prevent freezing with transfers and gait, incorporating postural reset, deep breaths and count to 5, weight shifting/marching in place, and use of visual cues to increase step length to help work through freezing episodes.    10/16/2023  NEUROMUSCULAR RE-EDUCATION: To improve balance, coordination, kinesthesia, posture, proprioception, reduce fall risk, amplitude of movement, speed of movement to reduce bradykinesia, and reduce rigidity.  Seated PWR! Moves: Up 2 x 10 Rock 2 x 10 Twist 2 x 10 - boom whackers (purple) used for added reach Step 2 x 10  PWR! Sit to stand x 10  SELF CARE: Provided education to increase independence with ADLs. Provided education on the role of PWR! Moves and correlation to everyday mobility and activities.  THERAPEUTIC ACTIVITIES: To improve functional performance.  Demonstration, verbal and tactile cues throughout for technique. : With 4WW = 8.53 sec W/o AD = 8.84 sec Gait speed: With 5TT = 3.85 ft/sec W/o AD = 3.71 ft/sec   10/09/23 THERAPEUTIC EXERCISE: To improve strength, endurance, ROM, and flexibility.  Demonstration, verbal and tactile cues throughout for technique.  NuStep - L4 x 6 min (UE/LE) S/L L bow & arrow open book stretch 10 x 5 Standing 3-way (60/90/120) doorway pec stretch 2 x 30 each position - best stretch felt at ~90 abduction Seated scap retraction with slight depression 10 x 5  MANUAL THERAPY: To promote normalized muscle tension, improved flexibility, pain modulation, and reduced pain utilizing connective tissue massage, therapeutic massage, and manual TP therapy. STM/DTM and manual TPR to L UT, LS, rhomboids, subscapularis, teres group, lats and pecs - most TTP in subscapularis and pec minor but able to achieve good muscle relaxation and reduction in pain and muscle tension in all muscles addressed today. L  scapular mobilization in R S/L   SELF CARE: Provided education to prevent loss of gains achieved with physical therapy and to prevent future decline in function.  Provided instruction in self-STM techniques to posterior shoulder complex using tennis ball on wall or Theracane.    10/07/23 THERAPEUTIC EXERCISE: To improve strength, endurance, ROM, and flexibility.  Rec Bike L3x6 min  THERAPEUTIC ACTIVITIES: To improve functional performance.  Demonstration, verbal and tactile cues throughout for technique.  LE MMT Goal Assessment for re-auth  Gait for 600 ft w/o SPC- noted gradual decrease in L step length after ~  350 ft   MANUAL THERAPY: To promote normalized muscle tension and reduced pain utilizing therapeutic massage, manual TP therapy, and myofascial release.  STM, TrP release to L subscapularis (3 min)   SELF CARE: Provided education on PT POC progression, to reduce fall risk, and to prevent future decline in function. Provided education on community resources related to Parkinson's including support group and educational sessions, community-based PWR! Moves and exercise/movement groups as well as on line resources related to PD.    09/30/23 THERAPEUTIC EXERCISE: To improve strength, endurance, ROM, and flexibility.  Recumbent Bike L3x45min Seated hip abd Blue TB x 20 Seated LAQ 3lb x 20 BLE  NEUROMUSCULAR RE-EDUCATION: To improve coordination, kinesthesia, posture, and proprioception.  Step fwd and opp arm reach x 10 BLE Step fwd and scap retraction w/ weight shift x 10 Standing rows GTB 2 x 10 staggered stance Standing shoulder ext GTB 2x10 staggered stance  Bird dog in standing x 10 B Clock balance x 5 BLE 12 to 6 o'clock Step downs from airex to floor fwd and lateral x 10 B   09/26/23 Nustep L5x78min UE/LE Standing shoulder ext RTB x 20 Seated rows RTB x 20 Standing step and reach x 10 B Lateral step and reach x 10 B Reverse chop RTB 2x10 B  Sit to stand x 20 with yellow  weight ball + OHP  Seated LAQ 3lb x 20 BLE   PATIENT EDUCATION:  Education details: PWR! Moves - Seated  Person educated: Patient Education method: Explanation, Demonstration, Verbal cues, and Handouts Education comprehension: verbalized understanding, returned demonstration, verbal cues required, and needs further education   HOME EXERCISE PROGRAM: Access Code: [email or web address] URL: [email or web address] Date: 10/09/2023 Prepared by: Elijah Hidden  Exercises - Seated Piriformis Stretch  - 2 x daily - 7 x weekly - 2 sets - 2 reps - 30 sec hold - Seated Piriformis Stretch  - 2 x daily - 7 x weekly - 2 sets - 2 reps - 30 sec hold - Standing Hip Abduction with Counter Support  - 1 x daily - 7 x weekly - 2 sets - 10 reps - Standing Hip Extension with Counter Support  - 1 x daily - 7 x weekly - 2 sets - 10 reps - Standing March with Counter Support  - 1 x daily - 7 x weekly - 2 sets - 10 reps - Standing Hip Flexor Stretch with Foot Elevated  - 2 x daily - 7 x weekly - 2 sets - 2 reps - 30 sec hold - Sidelying Thoracic Rotation with Open Book  - 1 x daily - 7 x weekly - 10 reps - 5 sec hold - Doorway Pec Stretch at 90 Degrees Abduction  - 2 x daily - 7 x weekly - 3 reps - 30 sec hold - Seated Scapular Retraction  - 2 x daily - 7 x weekly - 2 sets - 10 reps - 5 sec hold - Theracane Over Shoulder  - 1-2 x daily - 7 x weekly - 1-2 min hold  Patient Education - Tips to reduce freezing episodes with standing or walking  PWR! Moves - Seated - Standing   ASSESSMENT:  CLINICAL IMPRESSION: Continued PWR moves for mobility and to reduce sx secondary to PD. Pt was able to complete all interventions with minimal assistance, just SBA today. Cues required throughout session to correct form and technique with interventions. Jasmane will benefit from continued skilled PT to address ongoing abnormal muscle tension in L shoulder, as  well as strength and balance deficits to improve mobility and  activity tolerance with decreased pain interference, decreased episodes of freezing and decreased risk for falls.   EVAL: Tawyna P Kramm is a 73 y.o. female who was referred to physical therapy for evaluation and treatment for Parkinson's disease.  She was first diagnosed with Parkinson's in 2017.  She has completed 1 prior PT episode in 2017 within the [facility] system.  Her most recent PT episode for Parkinson's disease was Aug - Dec 2024 at [facility] in [place].  Since her last PT episode, she reports changes in her mobility with worsening of shuffling gait and freezing of gait, resulting in need for increased reliance on 4WW/rollator.  She also notes increased difficulty with bed mobility of late.  Patient presents with physical impairments of decreased timing and coordination of gait, impaired ambulation, impaired standing balance, abnormal posture, bradykinesia with transfers, impaired activity tolerance, LE weakness, postural instability and decreased safety awareness impacting safe and independent functional mobility.  Examination revealed patient is at risk for falls and functional decline as evidenced by the following objective test measures: 5xSTS of 10.79 sec (>15 sec indicates increased risk for falls and decreased BLE power), Gait speed of 2.34 ft/sec with 4WW and 2.50 ft/sec w/o AD (2.62 ft/sec is needed for safe community access), TUG of 9.63 sec with 4WW and 10.50 sec w/o AD (>13.5 sec indicates increased risk for falls), TUG Manual of 10.06 sec (difference between TUG manual and TUG >4.5 seconds indicates increased fall risk), and TUG cognitive of 12.66 sec (>/= 15 seconds indicates high risk for falls and community dwelling older adults).  TUG scores of >10% difference indicate difficulty with dual tasking.  Further testing of dynamic gait stability indicated and will be completed on next visit.  ABC scale score of 30% indicates a low level of physical functioning.  Kanyia will benefit  from skilled PT to address above deficits to improve mobility and activity tolerance to help reach the maximal level of functional independence with mobility and gait with reduced risk for falls.  Patient demonstrates understanding of this POC and is in agreement with this plan.   OBJECTIVE IMPAIRMENTS: Abnormal gait, decreased activity tolerance, decreased balance, decreased coordination, decreased knowledge of condition, decreased knowledge of use of DME, decreased mobility, difficulty walking, decreased strength, impaired perceived functional ability, increased muscle spasms, impaired flexibility, improper body mechanics, postural dysfunction, and pain.   ACTIVITY LIMITATIONS: standing, sleeping, stairs, transfers, bed mobility, bathing, locomotion level, and caring for others  PARTICIPATION LIMITATIONS: meal prep, cleaning, laundry, driving, shopping, and community activity  PERSONAL FACTORS: Fitness, Past/current experiences, Time since onset of injury/illness/exacerbation, and 3+ comorbidities: L common peroneal neuropathy (2021), headache, OA, RLS, h/o back pain are also affecting patient's functional outcome.   REHAB POTENTIAL: Good  CLINICAL DECISION MAKING: Unstable/unpredictable  EVALUATION COMPLEXITY: High   GOALS: Goals reviewed with patient? Yes  SHORT TERM GOALS: Target date: 10/21/2023  Patient will be independent with initial HEP. Baseline:  Goal status: MET - 09/19/23  2.  Patient will demonstrate improvement in overall LE muscle strength by at least 1/2 grade on MMT. Baseline: Refer to above MMT table 10/07/23 - overall B LE strength improving  Goal status: MET - 10/21/23  3.  Patient will be educated on strategies to decrease risk of falls.  Baseline:  Goal status: MET - 09/19/23 - education provided  4.  Patient will verbalize tips to reduce freezing/festination with gait and turns. Baseline:  09/23/23  able to verbalize some of the tips Goal status: MET - 10/07/23  - pt is able to discuss how to overcome freezing episodes and states they have helped  LONG TERM GOALS: Target date: 12/02/2023  Patient will be independent with ongoing/advanced HEP for self-management at home incorporating PWR! Moves as indicated .  Baseline:  Goal status: IN PROGRESS  2.  Patient will be able to ambulate 600' with or w/o LRAD with good safety and without freezing of gait to access community.  Baseline:  Goal status: PARTIALLY MET - 10/07/23 - pt walked w/o cane, noted slightly shorter steps at times on LLE, slowed down w/ walking and talking potentially decreasing safety awareness  3.  Patient will be able to step up/down curb safely with or w/o LRAD for safety with community ambulation.  Baseline:  Goal status: IN PROGRESS - 10/28/23 - worked on proper sequencing with cane or hiking pole with curb ascent/descent  4.  Patient will demonstrate gait speed of >/= 2.62 ft/sec to be a safe limited community ambulator with decreased risk for recurrent falls.  Baseline: 2.34 ft/sec with 4WW, 2.50 ft/sec w/o AD Goal status: MET - 10/16/23 - With 5TT = 3.85 ft/sec, W/o AD = 3.71 ft/sec  5.  Patient will demonstrate at least a 4 point improvement on FGA to improve gait stability and reduce risk for falls. (MCID = 4 points) Baseline: 19/30 (09/16/23) Goal status: IN PROGRESS - 10/21/23 - 14/30  6.  Patient will report >/= 43% on ABC scale to demonstrate improved balance confidence and decreased risk for falls. Baseline: 480 / 1600 = 30.0 % Goal status: IN PROGRESS - 09/30/23 - 420 / 1600 = 26.3 %  7. Patient will verbalize understanding of local Parkinson's disease community resources, including community fitness post d/c. Baseline:  Goal status: IN PROGRESS - 10/07/23 - information on resources provided to patient today, handouts given on exercise classes for PD    PLAN:  PT FREQUENCY: 2x/week  PT DURATION: 12 weeks  PLANNED INTERVENTIONS: 97164- PT Re-evaluation, 97750-  Physical Performance Testing, 97110-Therapeutic exercises, 97530- Therapeutic activity, 97112- Neuromuscular re-education, 97535- Self Care, [id number]- Manual therapy, [phone]- Gait training, [phone]- Electrical stimulation (unattended), [phone]- Electrical stimulation (manual), [id number]- Ultrasound, [id number]- Ionotophoresis 4mg /ml Dexamethasone , 79439 (1-2 muscles), 20561 (3+ muscles)- Dry Needling, Patient/Family education, Balance training, Stair training, Taping, Joint mobilization, DME instructions, Cryotherapy, and Moist heat  PLAN FOR NEXT SESSION: Review of seated and standing PWR Moves! as needed & introduce additional positions as tolerated; continue to address strategies to reduce freezing with transfers and gait; work on improving foot clearance with gait; progress postural strengthening; rotational movements; further MT to L shoulder complex as needed    Latise Dilley L Kaydn Kumpf, PTA 11/05/2023, 2:50 PM    Date of referral: 08/18/23 Referring provider: Evonnie Asberry RAMAN, DO Referring diagnosis? G20.A1 (ICD-10-CM) - Parkinson's disease without dyskinesia or fluctuating manifestations (HCC) Treatment diagnosis? (if different than referring diagnosis)  Other abnormalities of gait and mobility  Unsteadiness on feet  Muscle weakness (generalized)  What was this (referring dx) caused by? Ongoing Issue  Lysle of Condition: Chronic (continuous duration > 3 months)   Laterality: Both  Current Functional Measure Score: ABC scale 420 / 1600 = 26.3 %  Objective measurements identify impairments when they are compared to normal values, the uninvolved extremity, and prior level of function.  [x]  Yes  []  No  Objective assessment of functional ability: Moderate functional limitations   Briefly describe symptoms: Maile reports continued changes in  her mobility w/ increased occurrence of freezing episode and shuffling gait since around May. She is still now using a single point cane when walking longer distances  d/t decreased confidence, LE strength, and balance during gait. She currently has impairments in timing and balance during gait, impaired static and dynamic balance, bradykinesia with transfers, B LE weakness, postural instability, and dec'd safety awareness which is impacting her independent functional mobility. At this time, there is still a need for further assessment of dynamic gait and balance for fall risks (for both household and community settings) and is to be performed at upcoming visits. Pt's ABC score is currently 26.3% representing low level of physical functioning, which had declined w/ recent inc'd freezing episodes on a more frequent basis.    How did symptoms start: PT first diagnosed in 2017 with current exacerbation of symptoms beginning in April/May of this year with increasing difficulty with mobility noted, in particular getting in and out of bed and increased shuffling and freezing of gait.  Average pain intensity:  Last 24 hours: 8/10  Past week: up to 8/10  How often does the pt experience symptoms? Frequently  How much have the symptoms interfered with usual daily activities? Quite a bit  How has condition changed since care began at this facility? A little better  In general, how is the patients overall health? Good  Onset date: PT first diagnosed in 2017 with current exacerbation of symptoms beginning in April/May 2025   BACK PAIN (STarT Back Screening Tool) - (When applicable): N/A  Has your back pain spread down your leg(s) at sometime in the last 2 weeks? []  Yes   []  No Have you had pain in the shoulder or neck at sometime in the past 2 weeks? []  Yes   []  No Have you only walked short distances because of your back pain? []  Yes   []  No In the past 2 weeks, have you dressed more slowly than usual because of your back pain? []  Yes   []  No Do you think it is not really safe for person with a condition like yours to be physically active? []  Yes   []  No Have  worrying thoughts been going through your mind a lot of the time? []  Yes   []  No Do you feel that your back pain is terrible and it is never going to get any better? []  Yes   []  No In general, have you stopped enjoying all the things you usually enjoy? []  Yes   []  No Overall, how bothersome has your back pain been in the last 2 weeks? []  Not at all   []  Slightly     []  Moderate   []  Very much     []  Extremely

## 2023-11-05 NOTE — Telephone Encounter (Signed)
 Called pt and it is 1 mg  of ropinirole  3 times a day. It will be sent in to pharmacy via Dr. Evonnie.

## 2023-11-05 NOTE — Progress Notes (Unsigned)
 duplicate

## 2023-11-11 ENCOUNTER — Ambulatory Visit

## 2023-11-11 DIAGNOSIS — R2681 Unsteadiness on feet: Secondary | ICD-10-CM

## 2023-11-11 DIAGNOSIS — R2689 Other abnormalities of gait and mobility: Secondary | ICD-10-CM | POA: Diagnosis not present

## 2023-11-11 DIAGNOSIS — M6281 Muscle weakness (generalized): Secondary | ICD-10-CM | POA: Diagnosis not present

## 2023-11-11 NOTE — Therapy (Signed)
 OUTPATIENT PHYSICAL THERAPY PARKINSON'S TREATMENT     Patient Name: [name] [name] MRN: [id number] DOB:Oct 29, 1950, 73 y.o., female Today's Date: 11/11/2023   END OF SESSION:  PT End of Session - 11/11/23 1406     Visit Number 14    Date for PT Re-Evaluation 12/02/23    Authorization Type [employer]    Authorization Time Period 10/07/23 - 11/25/23    Authorization - Visit Number 8    Authorization - Number of Visits 14    PT Start Time 1401    PT Stop Time 1449    PT Time Calculation (min) 48 min    Activity Tolerance Patient tolerated treatment well;No increased pain    Behavior During Therapy [facility] for tasks assessed/performed              Past Medical History:  Diagnosis Date   Bursitis    Cellulitis    arm   Common peroneal neuropathy, left 04/27/2019   Headache    Hyperlipidemia    Insomnia    OA (osteoarthritis)    Onychomycosis    Osteoarthritis    Parkinson disease (HCC) 03/23/2015   RLS (restless legs syndrome) 03/23/2015   Tenosynovitis    Past Surgical History:  Procedure Laterality Date   TUBAL LIGATION     Patient Active Problem List   Diagnosis Date Noted   Common peroneal neuropathy, left 04/27/2019   Upper airway cough syndrome 05/03/2015   RLS (restless legs syndrome) 03/23/2015   Parkinson disease (HCC) 03/23/2015   Fever 08/29/2011   Headache 08/29/2011   Knee pain, right 08/29/2011   Back pain 08/29/2011   Bacteremia due to Gram-positive bacteria 08/29/2011   Hepatitis 08/29/2011   Periorbital edema 08/29/2011   Facial rash 08/29/2011   Blurred vision, right eye 08/29/2011   Tremor 08/29/2011   Meningitis 08/29/2011   CHEST PAIN UNSPECIFIED 03/04/2007   PNEUMONIA, LEFT LOWER LOBE 03/03/2007   COUGH, CHRONIC 03/03/2007    PCP: Toribio Jerel MATSU, MD   REFERRING PROVIDER: Evonnie Asberry RAMAN, DO   REFERRING DIAG: G20.A1 (ICD-10-CM) - Parkinson's disease without dyskinesia or fluctuating manifestations (HCC)  THERAPY DIAG:  Other  abnormalities of gait and mobility  Unsteadiness on feet  Muscle weakness (generalized)  RATIONALE FOR EVALUATION AND TREATMENT: Rehabilitation  ONSET DATE: PD diagnosis in January 2017, worsening symptoms since April/May 2025  NEXT MD VISIT: 11/25/23   SUBJECTIVE:  SUBJECTIVE STATEMENT: No shoulder pain, no recent falls or near falls  Pt accompanied by: self - sister (Reecia) in the waiting room  PAIN: Are you having pain? No and Yes: NPRS scale: 0/10   Pain location: L posterior shoulder  Pain description: sore, agony, tight  Aggravating factors: unpredictable  Relieving factors: stretching, lidocaine  patch   PERTINENT HISTORY:  L common peroneal neuropathy (2021), headache, OA, RLS, h/o back pain  PRECAUTIONS: None  RED FLAGS: None  WEIGHT BEARING RESTRICTIONS: No  FALLS:  Has patient fallen in last 6 months? No  LIVING ENVIRONMENT: Lives with: lives with their spouse or sister (when husband away for work) Lives in: [place] with husband, condo with sister Stairs: Yes: External: 1 steps; none - at her home Has following equipment at home: Single point cane, Environmental consultant - 4 wheeled, shower chair, and Grab bars  OCCUPATION: Retired  PLOF: Independent with household mobility with device, Independent with community mobility with device, Needs assistance with homemaking, and Leisure: scrabble, 4-5 days/week to gym until ~1 month ago  PATIENT GOALS: To be able to walk w/o the cement feeling in my feet - a steady walk w/o shuffling.   OBJECTIVE: (objective measures completed at initial evaluation unless otherwise dated)  DIAGNOSTIC FINDINGS:  05/01/2022 - MRI cervical spine IMPRESSION:  1. Stable degenerative changes of the cervical spine with mild spinal canal stenosis at  C2-3, C3-4 and C5-6.  2. Multilevel neural foraminal narrowing, severe on the right at C5-6 and moderate on the right at C3-4 and C7-T1.   COGNITION: Overall cognitive status: Within functional limits for tasks assessed   SENSATION: WFL Numbness in L great toe (intermittent)  COORDINATION: Gross motor - mild slowing  EDEMA:  Not recently  MUSCLE TONE: LLE: Mild into L ankle inversion  POSTURE:  rounded shoulders, forward head, flexed trunk , and L ankle drawing into inversion  MUSCLE LENGTH: Hamstrings: Mild tight B ITB: Mild tight L>R Piriformis: Mod tight B Hip flexors: Mod/severe tight B Quads: Mild/mod tight B Heelcord: Mild/mod tight B  LOWER EXTREMITY ROM:    Grossly WFL other than restrictions as noted above in muscle length  LOWER EXTREMITY MMT:    MMT Right eval Left eval R 10/07/23 L 10/07/23 R 10/21/23 L 10/21/23  Hip flexion 4 4 4- 4 4+ 4+  Hip extension 3- 3- 3+ 3+ 3+ 3+  Hip abduction 3 3 3+ 3+ 4- 4-  Hip adduction 4- 4- 4- 3+ 4 4  Hip internal rotation 4 4 4+ 4+ 4+ 4+  Hip external rotation 3+ 3+ 4 4 4+ 4+  Knee flexion 4+ 4 4+ 4+ 5 5  Knee extension 4+ 4+ 5 5 5 5   Ankle dorsiflexion 4 4- 4- 4+ 4+ 4+  Ankle plantarflexion 5 4+  5 4+    Ankle inversion 4- 3+ 4 4 4 4   Ankle eversion 4 4- 4 4 4 4   (Blank rows = not tested)  BED MOBILITY:  Sit to supine SBA Supine to sit SBA Rolling to Right SBA Rolling to Left SBA  TRANSFERS: Assistive device utilized: Environmental consultant - 4 wheeled and None  Sit to stand: Modified independence Stand to sit: Modified independence+ Chair to chair: Modified independence Floor: NT  GAIT: Distance walked: Clinic distances Assistive device utilized: Environmental consultant - 4 wheeled and None Level of assistance: Modified independence and SBA Gait pattern: step through pattern, decreased stride length, decreased hip/knee flexion- Right, decreased hip/knee flexion- Left, shuffling, and trunk flexed Comments: No freezing  demonstrated during  initial eval assessment however patient reports increasing episodes of freezing of gait recently  FUNCTIONAL TESTS:  5 times sit to stand: 10.79 sec w/o UE assist Timed up and go (TUG): 9.63 sec with 4WW; 10.50 sec w/o AD (normal), 10.06 sec (manual), 12.66 sec (cognitive - shifted from counting back by 3's to 2's mid test) 10 meter walk test: 14.00 sec with 4WW, 13.10 sec w/o AD Gait speed: 2.34 ft/sec with 4WW, 2.50 ft/sec w/o AD Functional gait assessment: 19/30 (09/16/23) Functional Gait Assessment  Gait assessed  Yes   Gait Level Surface Walks 20 ft, slow speed, abnormal gait pattern, evidence for imbalance or deviates 10-15 in outside of the 12 in walkway width. Requires more than 7 sec to ambulate 20 ft.   Change in Gait Speed Able to change speed, demonstrates mild gait deviations, deviates 6-10 in outside of the 12 in walkway width, or no gait deviations, unable to achieve a major change in velocity, or uses a change in velocity, or uses an assistive device.   Gait with Horizontal Head Turns Performs head turns smoothly with slight change in gait velocity (eg, minor disruption to smooth gait path), deviates 6-10 in outside 12 in walkway width, or uses an assistive device.   Gait with Vertical Head Turns Performs task with slight change in gait velocity (eg, minor disruption to smooth gait path), deviates 6 - 10 in outside 12 in walkway width or uses assistive device   Gait and Pivot Turn Pivot turns safely within 3 sec and stops quickly with no loss of balance.   Step Over Obstacle Is able to step over one shoe box (4.5 in total height) without changing gait speed. No evidence of imbalance.   Gait with Narrow Base of Support Ambulates 7-9 steps.   Gait with Eyes Closed Walks 20 ft, uses assistive device, slower speed, mild gait deviations, deviates 6-10 in outside 12 in walkway width. Ambulates 20 ft in less than 9 sec but greater than 7 sec.   Ambulating Backwards Walks 20 ft, slow speed,  abnormal gait pattern, evidence for imbalance, deviates 10-15 in outside 12 in walkway width.   Steps Alternating feet, must use rail.   Total Score 19      FGA Interpretation of scores: Non-Specific Older Adults Cutoff Score: <=22/30 = risk of falls Parkinson's Disease Cutoff score <15/30 = fall risk (Hoehn & Yahr 1-4) Minimally Clinically Important Difference (MCID)  Stroke (acute, subacute, and chronic) = MDC: 4.2 points Vestibular (acute) = MDC: 6 points Community Dwelling Older Adults =  MCID: 4 points Parkinson's Disease  =  MDC: 4.3 points  PATIENT SURVEYS:  ABC scale: 480 / 1600 = 30.0 %, <69% indicates risk for recurrent falls in PD and <50% indicates a low level of physical functioning; 420 / 1600 = 26.3 % (09/30/23)   TODAY'S TREATMENT:  11/11/2023 THERAPEUTIC EXERCISE: To improve strength, endurance, and promote reciprocal movement patterns.    Recumbent BIKE L3x66min Leg curls 20lb x 20 BLE Leg extension 10lb x 20 BLE Rows 15lb BUE NEUROMUSCULAR RE-EDUCATION: To improve balance, coordination, kinesthesia, posture, proprioception, reduce fall risk, amplitude of movement, speed of movement to reduce bradykinesia, and reduce rigidity. PWR! Twist standing x 20 each way PWR up x 10  PWR rock x 10 both ways PWR twist x 10 both ways PWR step x 10 both ways Clock reach standing on airex pad R/L 5x  Standing D1 ext GTB R/L UE with trunk rotation x 10  11/05/2023 THERAPEUTIC EXERCISE: To improve strength, endurance, and promote reciprocal movement patterns.    NuStep - L5 x 6 min (UE/LE to promote reciprocal movement patterns) Trunk extension at wall  NEUROMUSCULAR RE-EDUCATION: To improve balance, coordination, kinesthesia, posture, proprioception, reduce fall risk, amplitude of movement, speed of movement to reduce bradykinesia, and reduce rigidity. PWR! Step forward w/o UE support 2 x 10 each LE,  PWR! Step backward w/o UE support 2 x 10 each LE,  PWR! Step laterally w/  weight shift 2 x 10 BLE PWR! Twist standing x 10 each way Standing D1 ext GTB R/L UE with trunk rotation x 10  10/30/2023 THERAPEUTIC EXERCISE: To improve strength, endurance, and promote reciprocal movement patterns.    NuStep - L5 x 6 min (UE/LE to promote reciprocal movement patterns)  GAIT TRAINING: To prepare to wean from AD. 270' w/o AD, initially with CGA of PT via gait belt progressing to SBA with cues for increased step length and reciprocal arm swing  NEUROMUSCULAR RE-EDUCATION: To improve balance, coordination, kinesthesia, posture, proprioception, reduce fall risk, amplitude of movement, speed of movement to reduce bradykinesia, and reduce rigidity. Sequential reciprocal stepping over 8 low obstables/lines (yardsticks, canes, mop handle, carpet ridge, TB) x 10  PWR! Step forward and back with B UE on backs of 2 chairs to simulate parallel bars x 10 each LE PWR! Step forward w/o UE support x 10 each LE, CGA/SBA of PT via gait belt for safety PWR! Step backward w/o UE support x 10 each LE, CGA/SBA of PT for safety PWR! Step forward and back w/o UE support x 10 each LE, CGA/SBA of PT for safety - decreased amplitude of backward step on most reps   10/28/2023 THERAPEUTIC EXERCISE: To improve strength and endurance.   Rec Bike - L3 x 6 min  NEUROMUSCULAR RE-EDUCATION: To improve balance, coordination, kinesthesia, posture, proprioception, reduce fall risk, amplitude of movement, speed of movement to reduce bradykinesia, and reduce rigidity. Seated PWR! Moves: Up x 10 Rock x 10 Twist x 10 Step x 10 Standing PWR! Moves: Up x 10 Rock x 10 Twist x 10 Step x 10 Standing RTB scap retraction + B shoulder rows 10 x 3 Standing RTB scap retraction + B shoulder extension 10 x 3  GAIT TRAINING: To normalize gait pattern, improve safety with SPC vs hiking poles, and prepare to wean to LRAD.  180' with SPC on R - cues for sequencing of SPC to promote reciprocal gait pattern including  reciprocal L arm swing Curb negotiation with SPC on R and SBA of PT with VC's for sequencing 180' with hiking pole in R hand - cues for sequencing of pole to promote reciprocal gait pattern including reciprocal L arm swing 90' with B hiking poles - cues for sequencing of poles to promote reciprocal gait pattern  Curb negotiation with B hiking poles and SBA of PT with VC's for sequencing   10/21/2023 THERAPEUTIC EXERCISE: To improve strength and endurance.  Demonstration, verbal and tactile cues throughout for technique.  Rec Bike - L3 x 6 min  THERAPEUTIC ACTIVITIES: To improve functional performance.  Demonstration, verbal and tactile cues throughout for technique. LE MMT  Goal assessment  PHYSICAL PERFORMANCE TEST or MEASUREMENT: Functional Gait  Assessment  Gait Level Surface Walks 20 ft, slow speed, abnormal gait pattern, evidence for imbalance or deviates 10-15 in outside of the 12 in walkway width. Requires more than 7 sec to ambulate 20 ft.   Change in Gait Speed Able to  change speed, demonstrates mild gait deviations, deviates 6-10 in outside of the 12 in walkway width, or no gait deviations, unable to achieve a major change in velocity, or uses a change in velocity, or uses an assistive device.   Gait with Horizontal Head Turns Performs head turns smoothly with slight change in gait velocity (eg, minor disruption to smooth gait path), deviates 6-10 in outside 12 in walkway width, or uses an assistive device.   Gait with Vertical Head Turns Performs task with slight change in gait velocity (eg, minor disruption to smooth gait path), deviates 6 - 10 in outside 12 in walkway width or uses assistive device   Gait and Pivot Turn Pivot turns safely in greater than 3 sec and stops with no loss of balance, or pivot turns safely within 3 sec and stops with mild imbalance, requires small steps to catch balance.   Step Over Obstacle Is able to step over one shoe box (4.5 in total height) but must  slow down and adjust steps to clear box safely. May require verbal cueing.   Gait with Narrow Base of Support Ambulates less than 4 steps heel to toe or cannot perform without assistance.   Gait with Eyes Closed Walks 20 ft, slow speed, abnormal gait pattern, evidence for imbalance, deviates 10-15 in outside 12 in walkway width. Requires more than 9 sec to ambulate 20 ft.   Ambulating Backwards Walks 20 ft, slow speed, abnormal gait pattern, evidence for imbalance, deviates 10-15 in outside 12 in walkway width.   Steps Alternating feet, must use rail.   Total Score 14   FGA comment: < 19 = high risk fall      FGA assessment significantly limited by increased freezing of gait today  SELF CARE:  Reviewed education on tips to prevent freezing with transfers and gait, incorporating postural reset, deep breaths and count to 5, weight shifting/marching in place, and use of visual cues to increase step length to help work through freezing episodes.    10/16/2023  NEUROMUSCULAR RE-EDUCATION: To improve balance, coordination, kinesthesia, posture, proprioception, reduce fall risk, amplitude of movement, speed of movement to reduce bradykinesia, and reduce rigidity.  Seated PWR! Moves: Up 2 x 10 Rock 2 x 10 Twist 2 x 10 - boom whackers (purple) used for added reach Step 2 x 10  PWR! Sit to stand x 10  SELF CARE: Provided education to increase independence with ADLs. Provided education on the role of PWR! Moves and correlation to everyday mobility and activities.  THERAPEUTIC ACTIVITIES: To improve functional performance.  Demonstration, verbal and tactile cues throughout for technique. : With 4WW = 8.53 sec W/o AD = 8.84 sec Gait speed: With 5TT = 3.85 ft/sec W/o AD = 3.71 ft/sec   10/09/23 THERAPEUTIC EXERCISE: To improve strength, endurance, ROM, and flexibility.  Demonstration, verbal and tactile cues throughout for technique.  NuStep - L4 x 6 min (UE/LE) S/L L bow & arrow open  book stretch 10 x 5 Standing 3-way (60/90/120) doorway pec stretch 2 x 30 each position - best stretch felt at ~90 abduction Seated scap retraction with slight depression 10 x 5  MANUAL THERAPY: To promote normalized muscle tension, improved flexibility, pain modulation, and reduced pain utilizing connective tissue massage, therapeutic massage, and manual TP therapy. STM/DTM and manual TPR to L UT, LS, rhomboids, subscapularis, teres group, lats and pecs - most TTP in subscapularis and pec minor but able to achieve good muscle relaxation and reduction in pain and muscle  tension in all muscles addressed today. L scapular mobilization in R S/L   SELF CARE: Provided education to prevent loss of gains achieved with physical therapy and to prevent future decline in function.  Provided instruction in self-STM techniques to posterior shoulder complex using tennis ball on wall or Theracane.    10/07/23 THERAPEUTIC EXERCISE: To improve strength, endurance, ROM, and flexibility.  Rec Bike L3x6 min  THERAPEUTIC ACTIVITIES: To improve functional performance.  Demonstration, verbal and tactile cues throughout for technique.  LE MMT Goal Assessment for re-auth  Gait for 600 ft w/o SPC- noted gradual decrease in L step length after ~350 ft   MANUAL THERAPY: To promote normalized muscle tension and reduced pain utilizing therapeutic massage, manual TP therapy, and myofascial release.  STM, TrP release to L subscapularis (3 min)   SELF CARE: Provided education on PT POC progression, to reduce fall risk, and to prevent future decline in function. Provided education on community resources related to Parkinson's including support group and educational sessions, community-based PWR! Moves and exercise/movement groups as well as on line resources related to PD.    09/30/23 THERAPEUTIC EXERCISE: To improve strength, endurance, ROM, and flexibility.  Recumbent Bike L3x25min Seated hip abd Blue TB x  20 Seated LAQ 3lb x 20 BLE  NEUROMUSCULAR RE-EDUCATION: To improve coordination, kinesthesia, posture, and proprioception.  Step fwd and opp arm reach x 10 BLE Step fwd and scap retraction w/ weight shift x 10 Standing rows GTB 2 x 10 staggered stance Standing shoulder ext GTB 2x10 staggered stance  Bird dog in standing x 10 B Clock balance x 5 BLE 12 to 6 o'clock Step downs from airex to floor fwd and lateral x 10 B   09/26/23 Nustep L5x10min UE/LE Standing shoulder ext RTB x 20 Seated rows RTB x 20 Standing step and reach x 10 B Lateral step and reach x 10 B Reverse chop RTB 2x10 B  Sit to stand x 20 with yellow weight ball + OHP  Seated LAQ 3lb x 20 BLE   PATIENT EDUCATION:  Education details: PWR! Moves - Seated  Person educated: Patient Education method: Explanation, Demonstration, Verbal cues, and Handouts Education comprehension: verbalized understanding, returned demonstration, verbal cues required, and needs further education   HOME EXERCISE PROGRAM: Access Code: [email or web address] URL: [email or web address] Date: 11/11/2023 Prepared by: Tylon Kemmerling  Exercises - Seated Piriformis Stretch  - 2 x daily - 7 x weekly - 2 sets - 2 reps - 30 sec hold - Seated Piriformis Stretch  - 2 x daily - 7 x weekly - 2 sets - 2 reps - 30 sec hold - Standing Hip Abduction with Counter Support  - 1 x daily - 7 x weekly - 2 sets - 10 reps - Standing Hip Extension with Counter Support  - 1 x daily - 7 x weekly - 2 sets - 10 reps - Standing March with Counter Support  - 1 x daily - 7 x weekly - 2 sets - 10 reps - Standing Hip Flexor Stretch with Foot Elevated  - 2 x daily - 7 x weekly - 2 sets - 2 reps - 30 sec hold - Sidelying Thoracic Rotation with Open Book  - 1 x daily - 7 x weekly - 10 reps - 5 sec hold - Doorway Pec Stretch at 90 Degrees Abduction  - 2 x daily - 7 x weekly - 3 reps - 30 sec hold - Seated Scapular Retraction  - 2 x daily - 7 x  weekly - 2 sets - 10 reps - 5 sec  hold - Theracane Over Shoulder  - 1-2 x daily - 7 x weekly - 1-2 min hold - Single Leg Knee Extension with Weight Machine  - 1 x daily - 7 x weekly - 2 sets - 10 reps - Single Leg Hamstring Curl with Weight Machine  - 1 x daily - 7 x weekly - 2 sets - 10 reps  Patient Education - Tips to reduce freezing episodes with standing or walking   ASSESSMENT:  CLINICAL IMPRESSION: Progressed PWR moves, balance activities, and LE strengthening with good response today. Pt was able to complete all interventions with minimal assistance, just SBA today. Cues required throughout session to correct form and technique with interventions. Showed her some exercise she could do at the gym and gave instruction of them as well. Trenisha will benefit from continued skilled PT to address ongoing abnormal muscle tension in L shoulder, as well as strength and balance deficits to improve mobility and activity tolerance with decreased pain interference, decreased episodes of freezing and decreased risk for falls.   EVAL: [name] [name] is a 73 y.o. female who was referred to physical therapy for evaluation and treatment for Parkinson's disease.  She was first diagnosed with Parkinson's in 2017.  She has completed 1 prior PT episode in 2017 within the [facility] system.  Her most recent PT episode for Parkinson's disease was Aug - Dec 2024 at [facility] in [place].  Since her last PT episode, she reports changes in her mobility with worsening of shuffling gait and freezing of gait, resulting in need for increased reliance on 4WW/rollator.  She also notes increased difficulty with bed mobility of late.  Patient presents with physical impairments of decreased timing and coordination of gait, impaired ambulation, impaired standing balance, abnormal posture, bradykinesia with transfers, impaired activity tolerance, LE weakness, postural instability and decreased safety awareness impacting safe and independent functional mobility.   Examination revealed patient is at risk for falls and functional decline as evidenced by the following objective test measures: 5xSTS of 10.79 sec (>15 sec indicates increased risk for falls and decreased BLE power), Gait speed of 2.34 ft/sec with 4WW and 2.50 ft/sec w/o AD (2.62 ft/sec is needed for safe community access), TUG of 9.63 sec with 4WW and 10.50 sec w/o AD (>13.5 sec indicates increased risk for falls), TUG Manual of 10.06 sec (difference between TUG manual and TUG >4.5 seconds indicates increased fall risk), and TUG cognitive of 12.66 sec (>/= 15 seconds indicates high risk for falls and community dwelling older adults).  TUG scores of >10% difference indicate difficulty with dual tasking.  Further testing of dynamic gait stability indicated and will be completed on next visit.  ABC scale score of 30% indicates a low level of physical functioning.  Tariya will benefit from skilled PT to address above deficits to improve mobility and activity tolerance to help reach the maximal level of functional independence with mobility and gait with reduced risk for falls.  Patient demonstrates understanding of this POC and is in agreement with this plan.   OBJECTIVE IMPAIRMENTS: Abnormal gait, decreased activity tolerance, decreased balance, decreased coordination, decreased knowledge of condition, decreased knowledge of use of DME, decreased mobility, difficulty walking, decreased strength, impaired perceived functional ability, increased muscle spasms, impaired flexibility, improper body mechanics, postural dysfunction, and pain.   ACTIVITY LIMITATIONS: standing, sleeping, stairs, transfers, bed mobility, bathing, locomotion level, and caring for others  PARTICIPATION LIMITATIONS: meal  prep, cleaning, laundry, driving, shopping, and community activity  PERSONAL FACTORS: Fitness, Past/current experiences, Time since onset of injury/illness/exacerbation, and 3+ comorbidities: L common peroneal neuropathy  (2021), headache, OA, RLS, h/o back pain are also affecting patient's functional outcome.   REHAB POTENTIAL: Good  CLINICAL DECISION MAKING: Unstable/unpredictable  EVALUATION COMPLEXITY: High   GOALS: Goals reviewed with patient? Yes  SHORT TERM GOALS: Target date: 10/21/2023  Patient will be independent with initial HEP. Baseline:  Goal status: MET - 09/19/23  2.  Patient will demonstrate improvement in overall LE muscle strength by at least 1/2 grade on MMT. Baseline: Refer to above MMT table 10/07/23 - overall B LE strength improving  Goal status: MET - 10/21/23  3.  Patient will be educated on strategies to decrease risk of falls.  Baseline:  Goal status: MET - 09/19/23 - education provided  4.  Patient will verbalize tips to reduce freezing/festination with gait and turns. Baseline:  09/23/23 able to verbalize some of the tips Goal status: MET - 10/07/23 - pt is able to discuss how to overcome freezing episodes and states they have helped  LONG TERM GOALS: Target date: 12/02/2023  Patient will be independent with ongoing/advanced HEP for self-management at home incorporating PWR! Moves as indicated .  Baseline:  Goal status: IN PROGRESS  2.  Patient will be able to ambulate 600' with or w/o LRAD with good safety and without freezing of gait to access community.  Baseline:  Goal status: PARTIALLY MET - 10/07/23 - pt walked w/o cane, noted slightly shorter steps at times on LLE, slowed down w/ walking and talking potentially decreasing safety awareness  3.  Patient will be able to step up/down curb safely with or w/o LRAD for safety with community ambulation.  Baseline:  Goal status: IN PROGRESS - 10/28/23 - worked on proper sequencing with cane or hiking pole with curb ascent/descent  4.  Patient will demonstrate gait speed of >/= 2.62 ft/sec to be a safe limited community ambulator with decreased risk for recurrent falls.  Baseline: 2.34 ft/sec with 4WW, 2.50 ft/sec w/o  AD Goal status: MET - 10/16/23 - With 5TT = 3.85 ft/sec, W/o AD = 3.71 ft/sec  5.  Patient will demonstrate at least a 4 point improvement on FGA to improve gait stability and reduce risk for falls. (MCID = 4 points) Baseline: 19/30 (09/16/23) Goal status: IN PROGRESS - 10/21/23 - 14/30  6.  Patient will report >/= 43% on ABC scale to demonstrate improved balance confidence and decreased risk for falls. Baseline: 480 / 1600 = 30.0 % Goal status: IN PROGRESS - 09/30/23 - 420 / 1600 = 26.3 %  7. Patient will verbalize understanding of local Parkinson's disease community resources, including community fitness post d/c. Baseline:  Goal status: IN PROGRESS - 10/07/23 - information on resources provided to patient today, handouts given on exercise classes for PD    PLAN:  PT FREQUENCY: 2x/week  PT DURATION: 12 weeks  PLANNED INTERVENTIONS: 97164- PT Re-evaluation, 97750- Physical Performance Testing, 97110-Therapeutic exercises, 97530- Therapeutic activity, [id number]- Neuromuscular re-education, 97535- Self Care, [id number]- Manual therapy, [id number]- Gait training, [phone]- Electrical stimulation (unattended), [id number]- Electrical stimulation (manual), [id number]- Ultrasound, [id number]- Ionotophoresis 4mg /ml Dexamethasone , 20560 (1-2 muscles), 20561 (3+ muscles)- Dry Needling, Patient/Family education, Balance training, Stair training, Taping, Joint mobilization, DME instructions, Cryotherapy, and Moist heat  PLAN FOR NEXT SESSION: Review of seated and standing PWR Moves! as needed & introduce additional positions as tolerated; continue to address strategies to reduce freezing with  transfers and gait; work on improving foot clearance with gait; progress postural strengthening; rotational movements; further MT to L shoulder complex as needed    Zoe Creasman L Paulene Tayag, PTA 11/11/2023, 2:49 PM    Date of referral: 08/18/23 Referring provider: Evonnie Asberry RAMAN, DO Referring diagnosis? G20.A1 (ICD-10-CM) - Parkinson's disease without  dyskinesia or fluctuating manifestations (HCC) Treatment diagnosis? (if different than referring diagnosis)  Other abnormalities of gait and mobility  Unsteadiness on feet  Muscle weakness (generalized)  What was this (referring dx) caused by? Ongoing Issue  Lysle of Condition: Chronic (continuous duration > 3 months)   Laterality: Both  Current Functional Measure Score: ABC scale 420 / 1600 = 26.3 %  Objective measurements identify impairments when they are compared to normal values, the uninvolved extremity, and prior level of function.  [x]  Yes  []  No  Objective assessment of functional ability: Moderate functional limitations   Briefly describe symptoms: Jestina reports continued changes in her mobility w/ increased occurrence of freezing episode and shuffling gait since around May. She is still now using a single point cane when walking longer distances d/t decreased confidence, LE strength, and balance during gait. She currently has impairments in timing and balance during gait, impaired static and dynamic balance, bradykinesia with transfers, B LE weakness, postural instability, and dec'd safety awareness which is impacting her independent functional mobility. At this time, there is still a need for further assessment of dynamic gait and balance for fall risks (for both household and community settings) and is to be performed at upcoming visits. Pt's ABC score is currently 26.3% representing low level of physical functioning, which had declined w/ recent inc'd freezing episodes on a more frequent basis.    How did symptoms start: PT first diagnosed in 2017 with current exacerbation of symptoms beginning in April/May of this year with increasing difficulty with mobility noted, in particular getting in and out of bed and increased shuffling and freezing of gait.  Average pain intensity:  Last 24 hours: 8/10  Past week: up to 8/10  How often does the pt experience symptoms?  Frequently  How much have the symptoms interfered with usual daily activities? Quite a bit  How has condition changed since care began at this facility? A little better  In general, how is the patients overall health? Good  Onset date: PT first diagnosed in 2017 with current exacerbation of symptoms beginning in April/May 2025   BACK PAIN (STarT Back Screening Tool) - (When applicable): N/A  Has your back pain spread down your leg(s) at sometime in the last 2 weeks? []  Yes   []  No Have you had pain in the shoulder or neck at sometime in the past 2 weeks? []  Yes   []  No Have you only walked short distances because of your back pain? []  Yes   []  No In the past 2 weeks, have you dressed more slowly than usual because of your back pain? []  Yes   []  No Do you think it is not really safe for person with a condition like yours to be physically active? []  Yes   []  No Have worrying thoughts been going through your mind a lot of the time? []  Yes   []  No Do you feel that your back pain is terrible and it is never going to get any better? []  Yes   []  No In general, have you stopped enjoying all the things you usually enjoy? []  Yes   []  No Overall, how  bothersome has your back pain been in the last 2 weeks? []  Not at all   []  Slightly     []  Moderate   []  Very much     []  Extremely

## 2023-11-13 ENCOUNTER — Ambulatory Visit

## 2023-11-13 DIAGNOSIS — R2689 Other abnormalities of gait and mobility: Secondary | ICD-10-CM

## 2023-11-13 DIAGNOSIS — R2681 Unsteadiness on feet: Secondary | ICD-10-CM | POA: Diagnosis not present

## 2023-11-13 DIAGNOSIS — M6281 Muscle weakness (generalized): Secondary | ICD-10-CM | POA: Diagnosis not present

## 2023-11-13 NOTE — Therapy (Signed)
 OUTPATIENT PHYSICAL THERAPY PARKINSON'S TREATMENT     Patient Name: [name] [name] MRN: [id number] DOB:1950/09/19, 73 y.o., female Today's Date: 11/13/2023   END OF SESSION:  PT End of Session - 11/13/23 1508     Visit Number 15    Date for Recertification  12/02/23    Authorization Type [employer]    Authorization Time Period 10/07/23 - 11/25/23    Authorization - Visit Number 9    Authorization - Number of Visits 14    PT Start Time 1504    PT Stop Time 1532    PT Time Calculation (min) 28 min    Activity Tolerance Patient tolerated treatment well;No increased pain    Behavior During Therapy [facility] for tasks assessed/performed              Past Medical History:  Diagnosis Date   Bursitis    Cellulitis    arm   Common peroneal neuropathy, left 04/27/2019   Headache    Hyperlipidemia    Insomnia    OA (osteoarthritis)    Onychomycosis    Osteoarthritis    Parkinson disease (HCC) 03/23/2015   RLS (restless legs syndrome) 03/23/2015   Tenosynovitis    Past Surgical History:  Procedure Laterality Date   TUBAL LIGATION     Patient Active Problem List   Diagnosis Date Noted   Common peroneal neuropathy, left 04/27/2019   Upper airway cough syndrome 05/03/2015   RLS (restless legs syndrome) 03/23/2015   Parkinson disease (HCC) 03/23/2015   Fever 08/29/2011   Headache 08/29/2011   Knee pain, right 08/29/2011   Back pain 08/29/2011   Bacteremia due to Gram-positive bacteria 08/29/2011   Hepatitis 08/29/2011   Periorbital edema 08/29/2011   Facial rash 08/29/2011   Blurred vision, right eye 08/29/2011   Tremor 08/29/2011   Meningitis 08/29/2011   CHEST PAIN UNSPECIFIED 03/04/2007   PNEUMONIA, LEFT LOWER LOBE 03/03/2007   COUGH, CHRONIC 03/03/2007    PCP: Toribio Jerel MATSU, MD   REFERRING PROVIDER: Evonnie Asberry RAMAN, DO   REFERRING DIAG: G20.A1 (ICD-10-CM) - Parkinson's disease without dyskinesia or fluctuating manifestations (HCC)  THERAPY DIAG:  Other  abnormalities of gait and mobility  Unsteadiness on feet  Muscle weakness (generalized)  RATIONALE FOR EVALUATION AND TREATMENT: Rehabilitation  ONSET DATE: PD diagnosis in January 2017, worsening symptoms since April/May 2025  NEXT MD VISIT: 11/25/23   SUBJECTIVE:  SUBJECTIVE STATEMENT: Some   Pt accompanied by: self - sister Otho) in the waiting room  PAIN: Are you having pain? No and Yes: NPRS scale: 0/10   Pain location: L posterior shoulder  Pain description: sore, agony, tight  Aggravating factors: unpredictable  Relieving factors: stretching, lidocaine  patch   PERTINENT HISTORY:  L common peroneal neuropathy (2021), headache, OA, RLS, h/o back pain  PRECAUTIONS: None  RED FLAGS: None  WEIGHT BEARING RESTRICTIONS: No  FALLS:  Has patient fallen in last 6 months? No  LIVING ENVIRONMENT: Lives with: lives with their spouse or sister (when husband away for work) Lives in: [place] with husband, condo with sister Stairs: Yes: External: 1 steps; none - at her home Has following equipment at home: Single point cane, Environmental consultant - 4 wheeled, shower chair, and Grab bars  OCCUPATION: Retired  PLOF: Independent with household mobility with device, Independent with community mobility with device, Needs assistance with homemaking, and Leisure: scrabble, 4-5 days/week to gym until ~1 month ago  PATIENT GOALS: To be able to walk w/o the cement feeling in my feet - a steady walk w/o shuffling.   OBJECTIVE: (objective measures completed at initial evaluation unless otherwise dated)  DIAGNOSTIC FINDINGS:  05/01/2022 - MRI cervical spine IMPRESSION:  1. Stable degenerative changes of the cervical spine with mild spinal canal stenosis at C2-3, C3-4 and C5-6.  2. Multilevel neural  foraminal narrowing, severe on the right at C5-6 and moderate on the right at C3-4 and C7-T1.   COGNITION: Overall cognitive status: Within functional limits for tasks assessed   SENSATION: WFL Numbness in L great toe (intermittent)  COORDINATION: Gross motor - mild slowing  EDEMA:  Not recently  MUSCLE TONE: LLE: Mild into L ankle inversion  POSTURE:  rounded shoulders, forward head, flexed trunk , and L ankle drawing into inversion  MUSCLE LENGTH: Hamstrings: Mild tight B ITB: Mild tight L>R Piriformis: Mod tight B Hip flexors: Mod/severe tight B Quads: Mild/mod tight B Heelcord: Mild/mod tight B  LOWER EXTREMITY ROM:    Grossly WFL other than restrictions as noted above in muscle length  LOWER EXTREMITY MMT:    MMT Right eval Left eval R 10/07/23 L 10/07/23 R 10/21/23 L 10/21/23  Hip flexion 4 4 4- 4 4+ 4+  Hip extension 3- 3- 3+ 3+ 3+ 3+  Hip abduction 3 3 3+ 3+ 4- 4-  Hip adduction 4- 4- 4- 3+ 4 4  Hip internal rotation 4 4 4+ 4+ 4+ 4+  Hip external rotation 3+ 3+ 4 4 4+ 4+  Knee flexion 4+ 4 4+ 4+ 5 5  Knee extension 4+ 4+ 5 5 5 5   Ankle dorsiflexion 4 4- 4- 4+ 4+ 4+  Ankle plantarflexion 5 4+  5 4+    Ankle inversion 4- 3+ 4 4 4 4   Ankle eversion 4 4- 4 4 4 4   (Blank rows = not tested)  BED MOBILITY:  Sit to supine SBA Supine to sit SBA Rolling to Right SBA Rolling to Left SBA  TRANSFERS: Assistive device utilized: Environmental consultant - 4 wheeled and None  Sit to stand: Modified independence Stand to sit: Modified independence+ Chair to chair: Modified independence Floor: NT  GAIT: Distance walked: Clinic distances Assistive device utilized: Environmental consultant - 4 wheeled and None Level of assistance: Modified independence and SBA Gait pattern: step through pattern, decreased stride length, decreased hip/knee flexion- Right, decreased hip/knee flexion- Left, shuffling, and trunk flexed Comments: No freezing demonstrated during initial eval assessment however patient  reports increasing episodes of freezing of gait recently  FUNCTIONAL TESTS:  5 times sit to stand: 10.79 sec w/o UE assist Timed up and go (TUG): 9.63 sec with 4WW; 10.50 sec w/o AD (normal), 10.06 sec (manual), 12.66 sec (cognitive - shifted from counting back by 3's to 2's mid test) 10 meter walk test: 14.00 sec with 4WW, 13.10 sec w/o AD Gait speed: 2.34 ft/sec with 4WW, 2.50 ft/sec w/o AD Functional gait assessment: 19/30 (09/16/23) Functional Gait Assessment  Gait assessed  Yes   Gait Level Surface Walks 20 ft, slow speed, abnormal gait pattern, evidence for imbalance or deviates 10-15 in outside of the 12 in walkway width. Requires more than 7 sec to ambulate 20 ft.   Change in Gait Speed Able to change speed, demonstrates mild gait deviations, deviates 6-10 in outside of the 12 in walkway width, or no gait deviations, unable to achieve a major change in velocity, or uses a change in velocity, or uses an assistive device.   Gait with Horizontal Head Turns Performs head turns smoothly with slight change in gait velocity (eg, minor disruption to smooth gait path), deviates 6-10 in outside 12 in walkway width, or uses an assistive device.   Gait with Vertical Head Turns Performs task with slight change in gait velocity (eg, minor disruption to smooth gait path), deviates 6 - 10 in outside 12 in walkway width or uses assistive device   Gait and Pivot Turn Pivot turns safely within 3 sec and stops quickly with no loss of balance.   Step Over Obstacle Is able to step over one shoe box (4.5 in total height) without changing gait speed. No evidence of imbalance.   Gait with Narrow Base of Support Ambulates 7-9 steps.   Gait with Eyes Closed Walks 20 ft, uses assistive device, slower speed, mild gait deviations, deviates 6-10 in outside 12 in walkway width. Ambulates 20 ft in less than 9 sec but greater than 7 sec.   Ambulating Backwards Walks 20 ft, slow speed, abnormal gait pattern, evidence for  imbalance, deviates 10-15 in outside 12 in walkway width.   Steps Alternating feet, must use rail.   Total Score 19      FGA Interpretation of scores: Non-Specific Older Adults Cutoff Score: <=22/30 = risk of falls Parkinson's Disease Cutoff score <15/30 = fall risk (Hoehn & Yahr 1-4) Minimally Clinically Important Difference (MCID)  Stroke (acute, subacute, and chronic) = MDC: 4.2 points Vestibular (acute) = MDC: 6 points Community Dwelling Older Adults =  MCID: 4 points Parkinson's Disease  =  MDC: 4.3 points  PATIENT SURVEYS:  ABC scale: 480 / 1600 = 30.0 %, <69% indicates risk for recurrent falls in PD and <50% indicates a low level of physical functioning; 420 / 1600 = 26.3 % (09/30/23)   TODAY'S TREATMENT:  11/13/23 Bike L2x47min STM to B UT, levator, rhomboids ABC scale 11/11/2023 THERAPEUTIC EXERCISE: To improve strength, endurance, and promote reciprocal movement patterns.    Recumbent BIKE L3x35min Leg curls 20lb x 20 BLE Leg extension 10lb x 20 BLE Rows 15lb BUE NEUROMUSCULAR RE-EDUCATION: To improve balance, coordination, kinesthesia, posture, proprioception, reduce fall risk, amplitude of movement, speed of movement to reduce bradykinesia, and reduce rigidity. PWR! Twist standing x 20 each way PWR up x 10  PWR rock x 10 both ways PWR twist x 10 both ways PWR step x 10 both ways Clock reach standing on airex pad R/L 5x  Standing D1 ext GTB R/L UE with trunk  rotation x 10  11/05/2023 THERAPEUTIC EXERCISE: To improve strength, endurance, and promote reciprocal movement patterns.    NuStep - L5 x 6 min (UE/LE to promote reciprocal movement patterns) Trunk extension at wall  NEUROMUSCULAR RE-EDUCATION: To improve balance, coordination, kinesthesia, posture, proprioception, reduce fall risk, amplitude of movement, speed of movement to reduce bradykinesia, and reduce rigidity. PWR! Step forward w/o UE support 2 x 10 each LE,  PWR! Step backward w/o UE support 2 x 10 each  LE,  PWR! Step laterally w/ weight shift 2 x 10 BLE PWR! Twist standing x 10 each way Standing D1 ext GTB R/L UE with trunk rotation x 10  10/30/2023 THERAPEUTIC EXERCISE: To improve strength, endurance, and promote reciprocal movement patterns.    NuStep - L5 x 6 min (UE/LE to promote reciprocal movement patterns)  GAIT TRAINING: To prepare to wean from AD. 270' w/o AD, initially with CGA of PT via gait belt progressing to SBA with cues for increased step length and reciprocal arm swing  NEUROMUSCULAR RE-EDUCATION: To improve balance, coordination, kinesthesia, posture, proprioception, reduce fall risk, amplitude of movement, speed of movement to reduce bradykinesia, and reduce rigidity. Sequential reciprocal stepping over 8 low obstables/lines (yardsticks, canes, mop handle, carpet ridge, TB) x 10  PWR! Step forward and back with B UE on backs of 2 chairs to simulate parallel bars x 10 each LE PWR! Step forward w/o UE support x 10 each LE, CGA/SBA of PT via gait belt for safety PWR! Step backward w/o UE support x 10 each LE, CGA/SBA of PT for safety PWR! Step forward and back w/o UE support x 10 each LE, CGA/SBA of PT for safety - decreased amplitude of backward step on most reps   10/28/2023 THERAPEUTIC EXERCISE: To improve strength and endurance.   Rec Bike - L3 x 6 min  NEUROMUSCULAR RE-EDUCATION: To improve balance, coordination, kinesthesia, posture, proprioception, reduce fall risk, amplitude of movement, speed of movement to reduce bradykinesia, and reduce rigidity. Seated PWR! Moves: Up x 10 Rock x 10 Twist x 10 Step x 10 Standing PWR! Moves: Up x 10 Rock x 10 Twist x 10 Step x 10 Standing RTB scap retraction + B shoulder rows 10 x 3 Standing RTB scap retraction + B shoulder extension 10 x 3  GAIT TRAINING: To normalize gait pattern, improve safety with SPC vs hiking poles, and prepare to wean to LRAD.  180' with SPC on R - cues for sequencing of SPC to promote  reciprocal gait pattern including reciprocal L arm swing Curb negotiation with SPC on R and SBA of PT with VC's for sequencing 180' with hiking pole in R hand - cues for sequencing of pole to promote reciprocal gait pattern including reciprocal L arm swing 90' with B hiking poles - cues for sequencing of poles to promote reciprocal gait pattern  Curb negotiation with B hiking poles and SBA of PT with VC's for sequencing   10/21/2023 THERAPEUTIC EXERCISE: To improve strength and endurance.  Demonstration, verbal and tactile cues throughout for technique.  Rec Bike - L3 x 6 min  THERAPEUTIC ACTIVITIES: To improve functional performance.  Demonstration, verbal and tactile cues throughout for technique. LE MMT  Goal assessment  PHYSICAL PERFORMANCE TEST or MEASUREMENT: Functional Gait  Assessment  Gait Level Surface Walks 20 ft, slow speed, abnormal gait pattern, evidence for imbalance or deviates 10-15 in outside of the 12 in walkway width. Requires more than 7 sec to ambulate 20 ft.   Change in  Gait Speed Able to change speed, demonstrates mild gait deviations, deviates 6-10 in outside of the 12 in walkway width, or no gait deviations, unable to achieve a major change in velocity, or uses a change in velocity, or uses an assistive device.   Gait with Horizontal Head Turns Performs head turns smoothly with slight change in gait velocity (eg, minor disruption to smooth gait path), deviates 6-10 in outside 12 in walkway width, or uses an assistive device.   Gait with Vertical Head Turns Performs task with slight change in gait velocity (eg, minor disruption to smooth gait path), deviates 6 - 10 in outside 12 in walkway width or uses assistive device   Gait and Pivot Turn Pivot turns safely in greater than 3 sec and stops with no loss of balance, or pivot turns safely within 3 sec and stops with mild imbalance, requires small steps to catch balance.   Step Over Obstacle Is able to step over one shoe  box (4.5 in total height) but must slow down and adjust steps to clear box safely. May require verbal cueing.   Gait with Narrow Base of Support Ambulates less than 4 steps heel to toe or cannot perform without assistance.   Gait with Eyes Closed Walks 20 ft, slow speed, abnormal gait pattern, evidence for imbalance, deviates 10-15 in outside 12 in walkway width. Requires more than 9 sec to ambulate 20 ft.   Ambulating Backwards Walks 20 ft, slow speed, abnormal gait pattern, evidence for imbalance, deviates 10-15 in outside 12 in walkway width.   Steps Alternating feet, must use rail.   Total Score 14   FGA comment: < 19 = high risk fall      FGA assessment significantly limited by increased freezing of gait today  SELF CARE:  Reviewed education on tips to prevent freezing with transfers and gait, incorporating postural reset, deep breaths and count to 5, weight shifting/marching in place, and use of visual cues to increase step length to help work through freezing episodes.    10/16/2023  NEUROMUSCULAR RE-EDUCATION: To improve balance, coordination, kinesthesia, posture, proprioception, reduce fall risk, amplitude of movement, speed of movement to reduce bradykinesia, and reduce rigidity.  Seated PWR! Moves: Up 2 x 10 Rock 2 x 10 Twist 2 x 10 - boom whackers (purple) used for added reach Step 2 x 10  PWR! Sit to stand x 10  SELF CARE: Provided education to increase independence with ADLs. Provided education on the role of PWR! Moves and correlation to everyday mobility and activities.  THERAPEUTIC ACTIVITIES: To improve functional performance.  Demonstration, verbal and tactile cues throughout for technique. : With 4WW = 8.53 sec W/o AD = 8.84 sec Gait speed: With 5TT = 3.85 ft/sec W/o AD = 3.71 ft/sec   10/09/23 THERAPEUTIC EXERCISE: To improve strength, endurance, ROM, and flexibility.  Demonstration, verbal and tactile cues throughout for technique.  NuStep - L4 x 6 min  (UE/LE) S/L L bow & arrow open book stretch 10 x 5 Standing 3-way (60/90/120) doorway pec stretch 2 x 30 each position - best stretch felt at ~90 abduction Seated scap retraction with slight depression 10 x 5  MANUAL THERAPY: To promote normalized muscle tension, improved flexibility, pain modulation, and reduced pain utilizing connective tissue massage, therapeutic massage, and manual TP therapy. STM/DTM and manual TPR to L UT, LS, rhomboids, subscapularis, teres group, lats and pecs - most TTP in subscapularis and pec minor but able to achieve good muscle relaxation and reduction  in pain and muscle tension in all muscles addressed today. L scapular mobilization in R S/L   SELF CARE: Provided education to prevent loss of gains achieved with physical therapy and to prevent future decline in function.  Provided instruction in self-STM techniques to posterior shoulder complex using tennis ball on wall or Theracane.    10/07/23 THERAPEUTIC EXERCISE: To improve strength, endurance, ROM, and flexibility.  Rec Bike L3x6 min  THERAPEUTIC ACTIVITIES: To improve functional performance.  Demonstration, verbal and tactile cues throughout for technique.  LE MMT Goal Assessment for re-auth  Gait for 600 ft w/o SPC- noted gradual decrease in L step length after ~350 ft   MANUAL THERAPY: To promote normalized muscle tension and reduced pain utilizing therapeutic massage, manual TP therapy, and myofascial release.  STM, TrP release to L subscapularis (3 min)   SELF CARE: Provided education on PT POC progression, to reduce fall risk, and to prevent future decline in function. Provided education on community resources related to Parkinson's including support group and educational sessions, community-based PWR! Moves and exercise/movement groups as well as on line resources related to PD.    09/30/23 THERAPEUTIC EXERCISE: To improve strength, endurance, ROM, and flexibility.  Recumbent Bike  L3x40min Seated hip abd Blue TB x 20 Seated LAQ 3lb x 20 BLE  NEUROMUSCULAR RE-EDUCATION: To improve coordination, kinesthesia, posture, and proprioception.  Step fwd and opp arm reach x 10 BLE Step fwd and scap retraction w/ weight shift x 10 Standing rows GTB 2 x 10 staggered stance Standing shoulder ext GTB 2x10 staggered stance  Bird dog in standing x 10 B Clock balance x 5 BLE 12 to 6 o'clock Step downs from airex to floor fwd and lateral x 10 B   09/26/23 Nustep L5x51min UE/LE Standing shoulder ext RTB x 20 Seated rows RTB x 20 Standing step and reach x 10 B Lateral step and reach x 10 B Reverse chop RTB 2x10 B  Sit to stand x 20 with yellow weight ball + OHP  Seated LAQ 3lb x 20 BLE   PATIENT EDUCATION:  Education details: PWR! Moves - Seated  Person educated: Patient Education method: Explanation, Demonstration, Verbal cues, and Handouts Education comprehension: verbalized understanding, returned demonstration, verbal cues required, and needs further education   HOME EXERCISE PROGRAM: Access Code: [email or web address] URL: [email or web address] Date: 11/11/2023 Prepared by: Fuller Makin  Exercises - Seated Piriformis Stretch  - 2 x daily - 7 x weekly - 2 sets - 2 reps - 30 sec hold - Seated Piriformis Stretch  - 2 x daily - 7 x weekly - 2 sets - 2 reps - 30 sec hold - Standing Hip Abduction with Counter Support  - 1 x daily - 7 x weekly - 2 sets - 10 reps - Standing Hip Extension with Counter Support  - 1 x daily - 7 x weekly - 2 sets - 10 reps - Standing March with Counter Support  - 1 x daily - 7 x weekly - 2 sets - 10 reps - Standing Hip Flexor Stretch with Foot Elevated  - 2 x daily - 7 x weekly - 2 sets - 2 reps - 30 sec hold - Sidelying Thoracic Rotation with Open Book  - 1 x daily - 7 x weekly - 10 reps - 5 sec hold - Doorway Pec Stretch at 90 Degrees Abduction  - 2 x daily - 7 x weekly - 3 reps - 30 sec hold - Seated Scapular Retraction  - 2 x  daily - 7 x  weekly - 2 sets - 10 reps - 5 sec hold - Theracane Over Shoulder  - 1-2 x daily - 7 x weekly - 1-2 min hold - Single Leg Knee Extension with Weight Machine  - 1 x daily - 7 x weekly - 2 sets - 10 reps - Single Leg Hamstring Curl with Weight Machine  - 1 x daily - 7 x weekly - 2 sets - 10 reps  Patient Education - Tips to reduce freezing episodes with standing or walking   ASSESSMENT:  CLINICAL IMPRESSION: Pt arrived late to session, she reported pain across the shoulder blades so we focused on reducing soft tissue restrictions along this area. Will plan to continue working on PWR moves and balance activities going forward. [name] [name] will benefit from continued skilled PT to address ongoing abnormal muscle tension in L shoulder, as well as strength and balance deficits to improve mobility and activity tolerance with decreased pain interference, decreased episodes of freezing and decreased risk for falls.   EVAL: [name] [name] is a 73 y.o. female who was referred to physical therapy for evaluation and treatment for Parkinson's disease.  She was first diagnosed with Parkinson's in 2017.  She has completed 1 prior PT episode in 2017 within the [facility] system.  Her most recent PT episode for Parkinson's disease was Aug - Dec 2024 at [facility] in [place].  Since her last PT episode, she reports changes in her mobility with worsening of shuffling gait and freezing of gait, resulting in need for increased reliance on 4WW/rollator.  She also notes increased difficulty with bed mobility of late.  Patient presents with physical impairments of decreased timing and coordination of gait, impaired ambulation, impaired standing balance, abnormal posture, bradykinesia with transfers, impaired activity tolerance, LE weakness, postural instability and decreased safety awareness impacting safe and independent functional mobility.  Examination revealed patient is at risk for falls and functional decline as evidenced  by the following objective test measures: 5xSTS of 10.79 sec (>15 sec indicates increased risk for falls and decreased BLE power), Gait speed of 2.34 ft/sec with 4WW and 2.50 ft/sec w/o AD (2.62 ft/sec is needed for safe community access), TUG of 9.63 sec with 4WW and 10.50 sec w/o AD (>13.5 sec indicates increased risk for falls), TUG Manual of 10.06 sec (difference between TUG manual and TUG >4.5 seconds indicates increased fall risk), and TUG cognitive of 12.66 sec (>/= 15 seconds indicates high risk for falls and community dwelling older adults).  TUG scores of >10% difference indicate difficulty with dual tasking.  Further testing of dynamic gait stability indicated and will be completed on next visit.  ABC scale score of 30% indicates a low level of physical functioning.  [name] [name] will benefit from skilled PT to address above deficits to improve mobility and activity tolerance to help reach the maximal level of functional independence with mobility and gait with reduced risk for falls.  Patient demonstrates understanding of this POC and is in agreement with this plan.   OBJECTIVE IMPAIRMENTS: Abnormal gait, decreased activity tolerance, decreased balance, decreased coordination, decreased knowledge of condition, decreased knowledge of use of DME, decreased mobility, difficulty walking, decreased strength, impaired perceived functional ability, increased muscle spasms, impaired flexibility, improper body mechanics, postural dysfunction, and pain.   ACTIVITY LIMITATIONS: standing, sleeping, stairs, transfers, bed mobility, bathing, locomotion level, and caring for others  PARTICIPATION LIMITATIONS: meal prep, cleaning, laundry, driving, shopping, and community activity  PERSONAL FACTORS: Fitness, Past/current  experiences, Time since onset of injury/illness/exacerbation, and 3+ comorbidities: L common peroneal neuropathy (2021), headache, OA, RLS, h/o back pain are also affecting patient's functional  outcome.   REHAB POTENTIAL: Good  CLINICAL DECISION MAKING: Unstable/unpredictable  EVALUATION COMPLEXITY: High   GOALS: Goals reviewed with patient? Yes  SHORT TERM GOALS: Target date: 10/21/2023  Patient will be independent with initial HEP. Baseline:  Goal status: MET - 09/19/23  2.  Patient will demonstrate improvement in overall LE muscle strength by at least 1/2 grade on MMT. Baseline: Refer to above MMT table 10/07/23 - overall B LE strength improving  Goal status: MET - 10/21/23  3.  Patient will be educated on strategies to decrease risk of falls.  Baseline:  Goal status: MET - 09/19/23 - education provided  4.  Patient will verbalize tips to reduce freezing/festination with gait and turns. Baseline:  09/23/23 able to verbalize some of the tips Goal status: MET - 10/07/23 - pt is able to discuss how to overcome freezing episodes and states they have helped  LONG TERM GOALS: Target date: 12/02/2023  Patient will be independent with ongoing/advanced HEP for self-management at home incorporating PWR! Moves as indicated .  Baseline:  Goal status: IN PROGRESS  2.  Patient will be able to ambulate 600' with or w/o LRAD with good safety and without freezing of gait to access community.  Baseline:  Goal status: PARTIALLY MET - 10/07/23 - pt walked w/o cane, noted slightly shorter steps at times on LLE, slowed down w/ walking and talking potentially decreasing safety awareness  3.  Patient will be able to step up/down curb safely with or w/o LRAD for safety with community ambulation.  Baseline:  Goal status: IN PROGRESS - 10/28/23 - worked on proper sequencing with cane or hiking pole with curb ascent/descent  4.  Patient will demonstrate gait speed of >/= 2.62 ft/sec to be a safe limited community ambulator with decreased risk for recurrent falls.  Baseline: 2.34 ft/sec with 4WW, 2.50 ft/sec w/o AD Goal status: MET - 10/16/23 - With 5TT = 3.85 ft/sec, W/o AD = 3.71 ft/sec  5.   Patient will demonstrate at least a 4 point improvement on FGA to improve gait stability and reduce risk for falls. (MCID = 4 points) Baseline: 19/30 (09/16/23) Goal status: IN PROGRESS - 10/21/23 - 14/30  6.  Patient will report >/= 43% on ABC scale to demonstrate improved balance confidence and decreased risk for falls. Baseline: 480 / 1600 = 30.0 % Goal status: MET- 11/13/23 710 / 1600 = 44.4 %  7. Patient will verbalize understanding of local Parkinson's disease community resources, including community fitness post d/c. Baseline:  Goal status: IN PROGRESS - 10/07/23 - information on resources provided to patient today, handouts given on exercise classes for PD    PLAN:  PT FREQUENCY: 2x/week  PT DURATION: 12 weeks  PLANNED INTERVENTIONS: 97164- PT Re-evaluation, 97750- Physical Performance Testing, 97110-Therapeutic exercises, 97530- Therapeutic activity, 97112- Neuromuscular re-education, 97535- Self Care, 02859- Manual therapy, [phone]- Gait training, [phone]- Electrical stimulation (unattended), [phone]- Electrical stimulation (manual), [id number]- Ultrasound, [id number]- Ionotophoresis 4mg /ml Dexamethasone , 79439 (1-2 muscles), 20561 (3+ muscles)- Dry Needling, Patient/Family education, Balance training, Stair training, Taping, Joint mobilization, DME instructions, Cryotherapy, and Moist heat  PLAN FOR NEXT SESSION: continue PWR moves; rotational movements; address scapular pain if continues   Sulma Ruffino L Linda Grimmer, PTA 11/13/2023, 4:40 PM    Date of referral: 08/18/23 Referring provider: Evonnie Asberry RAMAN, DO Referring diagnosis? G20.A1 (ICD-10-CM) - Parkinson's disease  without dyskinesia or fluctuating manifestations (HCC) Treatment diagnosis? (if different than referring diagnosis)  Other abnormalities of gait and mobility  Unsteadiness on feet  Muscle weakness (generalized)  What was this (referring dx) caused by? Ongoing Issue  Lysle of Condition: Chronic (continuous duration > 3  months)   Laterality: Both  Current Functional Measure Score: ABC scale 420 / 1600 = 26.3 %  Objective measurements identify impairments when they are compared to normal values, the uninvolved extremity, and prior level of function.  [x]  Yes  []  No  Objective assessment of functional ability: Moderate functional limitations   Briefly describe symptoms: [name] [name] reports continued changes in her mobility w/ increased occurrence of freezing episode and shuffling gait since around May. She is still now using a single point cane when walking longer distances d/t decreased confidence, LE strength, and balance during gait. She currently has impairments in timing and balance during gait, impaired static and dynamic balance, bradykinesia with transfers, B LE weakness, postural instability, and dec'd safety awareness which is impacting her independent functional mobility. At this time, there is still a need for further assessment of dynamic gait and balance for fall risks (for both household and community settings) and is to be performed at upcoming visits. Pt's ABC score is currently 26.3% representing low level of physical functioning, which had declined w/ recent inc'd freezing episodes on a more frequent basis.    How did symptoms start: PT first diagnosed in 2017 with current exacerbation of symptoms beginning in April/May of this year with increasing difficulty with mobility noted, in particular getting in and out of bed and increased shuffling and freezing of gait.  Average pain intensity:  Last 24 hours: 8/10  Past week: up to 8/10  How often does the pt experience symptoms? Frequently  How much have the symptoms interfered with usual daily activities? Quite a bit  How has condition changed since care began at this facility? A little better  In general, how is the patients overall health? Good  Onset date: PT first diagnosed in 2017 with current exacerbation of symptoms beginning in  April/May 2025   BACK PAIN (STarT Back Screening Tool) - (When applicable): N/A  Has your back pain spread down your leg(s) at sometime in the last 2 weeks? []  Yes   []  No Have you had pain in the shoulder or neck at sometime in the past 2 weeks? []  Yes   []  No Have you only walked short distances because of your back pain? []  Yes   []  No In the past 2 weeks, have you dressed more slowly than usual because of your back pain? []  Yes   []  No Do you think it is not really safe for person with a condition like yours to be physically active? []  Yes   []  No Have worrying thoughts been going through your mind a lot of the time? []  Yes   []  No Do you feel that your back pain is terrible and it is never going to get any better? []  Yes   []  No In general, have you stopped enjoying all the things you usually enjoy? []  Yes   []  No Overall, how bothersome has your back pain been in the last 2 weeks? []  Not at all   []  Slightly     []  Moderate   []  Very much     []  Extremely

## 2023-11-18 ENCOUNTER — Ambulatory Visit: Admitting: Physical Therapy

## 2023-11-19 NOTE — Progress Notes (Signed)
 Assessment/Plan:   1.  Parkinsons Disease, dx 2017 by [facility] and followed for years at [facility]  -continue carbidopa /levodopa  25/100, 2 tablets at 7am/2 at 10am/2 at 1pm/1 at 4pm (she goes to bed at 7pm)   -restart inbrija .   Use at least for first morning on and up to 4 other times in the day.  Her insurance was very expensive for this so I need to see if there was another alternative for cost.  She was using up to twice per day  -Continue ropinirole , 1 mg, 1 tablet at 8 AM/noon/4 PM   -discussed exercising  2.  Constipation  -was on Linzess but not now.  She states that she needs to f/u to get new RX but encouraged her to get back on it.     Subjective:   [name] [name] was seen today in follow up for Parkinsons disease.  My previous records were reviewed prior to todays visit as well as outside records available to me.  Pt with sister who supplements hx.  Patient last seen in June.  Her levodopa  was increased at that visit.  We also started inbrija  at least for first morning on and she reports that she is not taking it due to cost.  She does state that it did help.  She has also been attending PT since our last visit and she has 4 sessions left.  Notes reviewed.  She is going 2 days per [facility].  No hallucinations.  She does experience freezing.    Current prescribed movement disorder medications: Carbidopa /levodopa  25/100, 2 tablets at 7am/2 at 10am/2 at 1pm/1 at 4pm (increased) Inbrija  (not taking due to cost) Ropinirole  1 mg, 1 tablet at 8 AM/noon/4 PM   PREVIOUS MEDICATIONS: Cogentin ; ropinirole ; carbidopa /levodopa  50/200 (she tolerated this well, I just wanted to change her off of this in the daytime); inbrija  (worked well but too expensive)  ALLERGIES:   Allergies  Allergen Reactions   Iodine Swelling   Iohexol  Swelling     Desc: FACIAL SWELLING and temporary blindness    Penicillins Itching and Swelling   Shellfish Allergy  Swelling   Caffeine Other (See Comments)     Palpitations and pain    Gabapentin Other (See Comments)    Insomnia    Hydrocodone-Acetaminophen  Itching   Sulfa Drugs Cross Reactors Nausea And Vomiting   Tramadol  Nausea And Vomiting    CURRENT MEDICATIONS:  Current Meds  Medication Sig   albuterol (VENTOLIN HFA) 108 (90 Base) MCG/ACT inhaler Inhale 2 puffs into the lungs every 6 (six) hours as needed for wheezing or shortness of breath.   Ascorbic Acid (VITAMIN C PO) Take by mouth.   atorvastatin (LIPITOR) 20 MG tablet Take 20 mg by mouth daily.   Calcium Carb-Cholecalciferol (CALCIUM 600 + D PO) Take 2 tablets by mouth daily.   carbidopa -levodopa  (SINEMET  IR) 25-100 MG tablet 2 tablets at 7am/2 at 10am/2 at 1pm/1 at 4pm   ferrous sulfate 325 (65 FE) MG tablet Take 325 mg by mouth daily with breakfast.   fexofenadine (ALLEGRA) 180 MG tablet Take 180 mg by mouth daily.   FLUoxetine (PROZAC) 20 MG capsule Take 20 mg by mouth daily.   LINZESS 145 MCG CAPS capsule Take 145 mcg by mouth daily.   Naproxen Sodium (ALEVE PO) Take by mouth as needed.   pantoprazole  (PROTONIX ) 40 MG tablet Take 40 mg by mouth daily.   rOPINIRole  (REQUIP ) 1 MG tablet Take 1 tablet (1 mg total)  by mouth 3 (three) times daily.   vitamin B-12 (CYANOCOBALAMIN) 1000 MCG tablet Take 1,000 mcg by mouth daily.     Objective:   PHYSICAL EXAMINATION:    VITALS:   Vitals:   11/25/23 0904  BP: 120/82  Pulse: 82  SpO2: 97%  Weight: 155 lb (70.3 kg)     GEN:  The patient appears stated age and is in NAD. HEENT:  Normocephalic, atraumatic.  The mucous membranes are moist. The superficial temporal arteries are without ropiness or tenderness. CV:  RRR Lungs:  CTAB Neck/HEME:  There are no carotid bruits bilaterally.  Neurological examination:  Orientation: The patient is alert and oriented x3. Cranial nerves: There is good facial symmetry without facial hypomimia. The speech is fluent and clear. Soft palate rises symmetrically and there is no tongue  deviation. Hearing is intact to conversational tone. Sensation: Sensation is intact to light touch throughout Motor: Strength is at least antigravity x4.   Movement examination: Tone: There is nl tone today Abnormal movements: she has intermittent LUE rest tremor Coordination:  There is trouble with hand opening and closing but that is arthritic in nature.  All other RAMs are normal bilaterally Gait and Station: she is able to arise without using the hands.  She is a bit wide based and turns en bloc but otherwise doesn't shuffle  I have reviewed and interpreted the following labs independently    Chemistry      Component Value Date/Time   NA 133 (L) 03/03/2019 0527   K 4.1 03/03/2019 0527   CL 97 (L) 03/03/2019 0527   CO2 24 03/03/2019 0527   BUN 15 03/03/2019 0527   CREATININE 0.62 03/03/2019 0527   CREATININE 0.72 04/22/2016 1616      Component Value Date/Time   CALCIUM 9.4 03/03/2019 0527   ALKPHOS 57 03/03/2019 0527   AST 27 03/03/2019 0527   ALT 59 (H) 03/03/2019 0527   BILITOT 0.9 03/03/2019 0527       Lab Results  Component Value Date   WBC 9.0 03/03/2019   HGB 12.4 03/03/2019   HCT 38.0 03/03/2019   MCV 93.1 03/03/2019   PLT 288 03/03/2019    Lab Results  Component Value Date   TSH 1.83 04/22/2016     Total time spent on today's visit was 30 minutes, including both face-to-face time and nonface-to-face time.  Time included that spent on review of records (prior notes available to me/labs/imaging if pertinent), discussing treatment and goals, answering patient's questions and coordinating care.  Cc:  Toribio Jerel MATSU, MD

## 2023-11-20 ENCOUNTER — Encounter: Payer: Self-pay | Admitting: Physical Therapy

## 2023-11-20 ENCOUNTER — Ambulatory Visit: Admitting: Physical Therapy

## 2023-11-20 DIAGNOSIS — R2681 Unsteadiness on feet: Secondary | ICD-10-CM | POA: Diagnosis not present

## 2023-11-20 DIAGNOSIS — M6281 Muscle weakness (generalized): Secondary | ICD-10-CM | POA: Diagnosis not present

## 2023-11-20 DIAGNOSIS — R2689 Other abnormalities of gait and mobility: Secondary | ICD-10-CM

## 2023-11-20 NOTE — Therapy (Signed)
 OUTPATIENT PHYSICAL THERAPY PARKINSON'S TREATMENT  Progress Note  Reporting Period 10/21/2023 to 11/20/2023   See note below for Objective Data and Assessment of Progress/Goals.    Patient Name: [name] [name] MRN: [id number] DOB:05/29/1950, 73 y.o., female Today's Date: 11/20/2023   END OF SESSION:  PT End of Session - 11/20/23 1449     Visit Number 16    Date for Recertification  12/02/23    Authorization Type [employer]    Authorization Time Period 10/07/23 - 11/25/23    Authorization - Visit Number 10    Authorization - Number of Visits 14    Progress Note Due on Visit 20    PT Start Time 1449    PT Stop Time 1529    PT Time Calculation (min) 40 min    Activity Tolerance Patient tolerated treatment well    Behavior During Therapy [facility] for tasks assessed/performed               Past Medical History:  Diagnosis Date   Bursitis    Cellulitis    arm   Common peroneal neuropathy, left 04/27/2019   Headache    Hyperlipidemia    Insomnia    OA (osteoarthritis)    Onychomycosis    Osteoarthritis    Parkinson disease (HCC) 03/23/2015   RLS (restless legs syndrome) 03/23/2015   Tenosynovitis    Past Surgical History:  Procedure Laterality Date   TUBAL LIGATION     Patient Active Problem List   Diagnosis Date Noted   Common peroneal neuropathy, left 04/27/2019   Upper airway cough syndrome 05/03/2015   RLS (restless legs syndrome) 03/23/2015   Parkinson disease (HCC) 03/23/2015   Fever 08/29/2011   Headache 08/29/2011   Knee pain, right 08/29/2011   Back pain 08/29/2011   Bacteremia due to Gram-positive bacteria 08/29/2011   Hepatitis 08/29/2011   Periorbital edema 08/29/2011   Facial rash 08/29/2011   Blurred vision, right eye 08/29/2011   Tremor 08/29/2011   Meningitis 08/29/2011   CHEST PAIN UNSPECIFIED 03/04/2007   PNEUMONIA, LEFT LOWER LOBE 03/03/2007   COUGH, CHRONIC 03/03/2007    PCP: Toribio Jerel MATSU, MD   REFERRING PROVIDER: Evonnie Asberry RAMAN,  DO   REFERRING DIAG: G20.A1 (ICD-10-CM) - Parkinson's disease without dyskinesia or fluctuating manifestations (HCC)  THERAPY DIAG:  Other abnormalities of gait and mobility  Unsteadiness on feet  Muscle weakness (generalized)  RATIONALE FOR EVALUATION AND TREATMENT: Rehabilitation  ONSET DATE: PD diagnosis in January 2017, worsening symptoms since April/May 2025  NEXT MD VISIT: 11/25/23   SUBJECTIVE:  SUBJECTIVE STATEMENT: Pt missed her visit earlier this week due to increased R shoulder pain.  Still sore with some movements today but not as bad. Freezing episodes still occurring off and on - not too bad today.  Pt reports she found the PD Symposium very informative for herself and her husband and sister.  Pt accompanied by: self - sister Otho) in the waiting room  PAIN: Are you having pain? No and Yes: NPRS scale: 0-9/10   Pain location: R lateral shoulder  Pain description: sore  Aggravating factors: with movement, but unpredictable  Relieving factors: stretching, lidocaine  patch   PERTINENT HISTORY:  L common peroneal neuropathy (2021), headache, OA, RLS, h/o back pain  PRECAUTIONS: None  RED FLAGS: None  WEIGHT BEARING RESTRICTIONS: No  FALLS:  Has patient fallen in last 6 months? No  LIVING ENVIRONMENT: Lives with: lives with their spouse or sister (when husband away for work) Lives in: [place] with husband, condo with sister Stairs: Yes: External: 1 steps; none - at her home Has following equipment at home: Single point cane, Environmental consultant - 4 wheeled, shower chair, and Grab bars  OCCUPATION: Retired  PLOF: Independent with household mobility with device, Independent with community mobility with device, Needs assistance with homemaking, and Leisure: scrabble, 4-5  days/week to gym until ~1 month ago  PATIENT GOALS: To be able to walk w/o the cement feeling in my feet - a steady walk w/o shuffling.   OBJECTIVE: (objective measures completed at initial evaluation unless otherwise dated)  DIAGNOSTIC FINDINGS:  05/01/2022 - MRI cervical spine IMPRESSION:  1. Stable degenerative changes of the cervical spine with mild spinal canal stenosis at C2-3, C3-4 and C5-6.  2. Multilevel neural foraminal narrowing, severe on the right at C5-6 and moderate on the right at C3-4 and C7-T1.   COGNITION: Overall cognitive status: Within functional limits for tasks assessed   SENSATION: WFL Numbness in L great toe (intermittent)  COORDINATION: Gross motor - mild slowing  EDEMA:  Not recently  MUSCLE TONE: LLE: Mild into L ankle inversion  POSTURE:  rounded shoulders, forward head, flexed trunk , and L ankle drawing into inversion  MUSCLE LENGTH: Hamstrings: Mild tight B ITB: Mild tight L>R Piriformis: Mod tight B Hip flexors: Mod/severe tight B Quads: Mild/mod tight B Heelcord: Mild/mod tight B  LOWER EXTREMITY ROM:    Grossly WFL other than restrictions as noted above in muscle length  LOWER EXTREMITY MMT:    MMT Right eval Left eval R 10/07/23 L 10/07/23 R 10/21/23 L 10/21/23  Hip flexion 4 4 4- 4 4+ 4+  Hip extension 3- 3- 3+ 3+ 3+ 3+  Hip abduction 3 3 3+ 3+ 4- 4-  Hip adduction 4- 4- 4- 3+ 4 4  Hip internal rotation 4 4 4+ 4+ 4+ 4+  Hip external rotation 3+ 3+ 4 4 4+ 4+  Knee flexion 4+ 4 4+ 4+ 5 5  Knee extension 4+ 4+ 5 5 5 5   Ankle dorsiflexion 4 4- 4- 4+ 4+ 4+  Ankle plantarflexion 5 4+  5 4+    Ankle inversion 4- 3+ 4 4 4 4   Ankle eversion 4 4- 4 4 4 4   (Blank rows = not tested)  BED MOBILITY:  Sit to supine SBA Supine to sit SBA Rolling to Right SBA Rolling to Left SBA  TRANSFERS: Assistive device utilized: Environmental consultant - 4 wheeled and None  Sit to stand: Modified independence Stand to sit: Modified independence+ Chair to  chair: Modified independence Floor:  NT  GAIT: Distance walked: Clinic distances Assistive device utilized: Environmental consultant - 4 wheeled and None Level of assistance: Modified independence and SBA Gait pattern: step through pattern, decreased stride length, decreased hip/knee flexion- Right, decreased hip/knee flexion- Left, shuffling, and trunk flexed Comments: No freezing demonstrated during initial eval assessment however patient reports increasing episodes of freezing of gait recently  FUNCTIONAL TESTS:  5 times sit to stand: 10.79 sec w/o UE assist Timed up and go (TUG): 9.63 sec with 4WW; 10.50 sec w/o AD (normal), 10.06 sec (manual), 12.66 sec (cognitive - shifted from counting back by 3's to 2's mid test) 10 meter walk test: 14.00 sec with 4WW, 13.10 sec w/o AD Gait speed: 2.34 ft/sec with 4WW, 2.50 ft/sec w/o AD Functional gait assessment: 19/30 (09/16/23) Functional Gait Assessment  Gait assessed  Yes   Gait Level Surface Walks 20 ft, slow speed, abnormal gait pattern, evidence for imbalance or deviates 10-15 in outside of the 12 in walkway width. Requires more than 7 sec to ambulate 20 ft.   Change in Gait Speed Able to change speed, demonstrates mild gait deviations, deviates 6-10 in outside of the 12 in walkway width, or no gait deviations, unable to achieve a major change in velocity, or uses a change in velocity, or uses an assistive device.   Gait with Horizontal Head Turns Performs head turns smoothly with slight change in gait velocity (eg, minor disruption to smooth gait path), deviates 6-10 in outside 12 in walkway width, or uses an assistive device.   Gait with Vertical Head Turns Performs task with slight change in gait velocity (eg, minor disruption to smooth gait path), deviates 6 - 10 in outside 12 in walkway width or uses assistive device   Gait and Pivot Turn Pivot turns safely within 3 sec and stops quickly with no loss of balance.   Step Over Obstacle Is able to step over one  shoe box (4.5 in total height) without changing gait speed. No evidence of imbalance.   Gait with Narrow Base of Support Ambulates 7-9 steps.   Gait with Eyes Closed Walks 20 ft, uses assistive device, slower speed, mild gait deviations, deviates 6-10 in outside 12 in walkway width. Ambulates 20 ft in less than 9 sec but greater than 7 sec.   Ambulating Backwards Walks 20 ft, slow speed, abnormal gait pattern, evidence for imbalance, deviates 10-15 in outside 12 in walkway width.   Steps Alternating feet, must use rail.   Total Score 19      FGA Interpretation of scores: Non-Specific Older Adults Cutoff Score: <=22/30 = risk of falls Parkinson's Disease Cutoff score <15/30 = fall risk (Hoehn & Yahr 1-4) Minimally Clinically Important Difference (MCID)  Stroke (acute, subacute, and chronic) = MDC: 4.2 points Vestibular (acute) = MDC: 6 points Community Dwelling Older Adults =  MCID: 4 points Parkinson's Disease  =  MDC: 4.3 points  PATIENT SURVEYS:  ABC scale: 480 / 1600 = 30.0 %, <69% indicates risk for recurrent falls in PD and <50% indicates a low level of physical functioning; 420 / 1600 = 26.3 % (09/30/23)   TODAY'S TREATMENT:   11/20/2023 THERAPEUTIC EXERCISE: To improve strength and endurance.   Rec Bike - L3 x 6 min  PHYSICAL PERFORMANCE TEST or MEASUREMENT: Functional Gait  Assessment  Gait Level Surface Walks 20 ft in less than 7 sec but greater than 5.5 sec, uses assistive device, slower speed, mild gait deviations, or deviates 6-10 in outside of the 12 in  walkway width.   Change in Gait Speed Able to smoothly change walking speed without loss of balance or gait deviation. Deviate no more than 6 in outside of the 12 in walkway width.   Gait with Horizontal Head Turns Performs head turns smoothly with slight change in gait velocity (eg, minor disruption to smooth gait path), deviates 6-10 in outside 12 in walkway width, or uses an assistive device.   Gait with Vertical Head  Turns Performs head turns with no change in gait. Deviates no more than 6 in outside 12 in walkway width.   Gait and Pivot Turn Pivot turns safely within 3 sec and stops quickly with no loss of balance.   Step Over Obstacle Is able to step over one shoe box (4.5 in total height) without changing gait speed. No evidence of imbalance.   Gait with Narrow Base of Support Ambulates 4-7 steps.   Gait with Eyes Closed Walks 20 ft, slow speed, abnormal gait pattern, evidence for imbalance, deviates 10-15 in outside 12 in walkway width. Requires more than 9 sec to ambulate 20 ft.   Ambulating Backwards Walks 20 ft, uses assistive device, slower speed, mild gait deviations, deviates 6-10 in outside 12 in walkway width.   Steps Alternating feet, must use rail.   Total Score 21   FGA comment: 19-24 = medium risk fall       GAIT TRAINING: To normalize gait pattern, improve safety with SPC, and prepare to wean to LRAD. 600' with SPC on right and SBA/supervision of PT - initial cues for reciprocal arm swing/SPC advancement while maintaining symmetrical increased stride length : 9.12 sec with SPC 8.43 sec w/o AD Gait speed: 3.60 ft/sec with SPC 3.89 ft/sec w/o AD   11/13/23 Bike L2x82min STM to B UT, levator, rhomboids ABC scale   11/11/2023 THERAPEUTIC EXERCISE: To improve strength, endurance, and promote reciprocal movement patterns.    Recumbent BIKE L3x57min Leg curls 20lb x 20 BLE Leg extension 10lb x 20 BLE Rows 15lb BUE NEUROMUSCULAR RE-EDUCATION: To improve balance, coordination, kinesthesia, posture, proprioception, reduce fall risk, amplitude of movement, speed of movement to reduce bradykinesia, and reduce rigidity. PWR! Twist standing x 20 each way PWR up x 10  PWR rock x 10 both ways PWR twist x 10 both ways PWR step x 10 both ways Clock reach standing on airex pad R/L 5x  Standing D1 ext GTB R/L UE with trunk rotation x 10   11/05/2023 THERAPEUTIC EXERCISE: To improve  strength, endurance, and promote reciprocal movement patterns.    NuStep - L5 x 6 min (UE/LE to promote reciprocal movement patterns) Trunk extension at wall  NEUROMUSCULAR RE-EDUCATION: To improve balance, coordination, kinesthesia, posture, proprioception, reduce fall risk, amplitude of movement, speed of movement to reduce bradykinesia, and reduce rigidity. PWR! Step forward w/o UE support 2 x 10 each LE,  PWR! Step backward w/o UE support 2 x 10 each LE,  PWR! Step laterally w/ weight shift 2 x 10 BLE PWR! Twist standing x 10 each way Standing D1 ext GTB R/L UE with trunk rotation x 10   10/30/2023 THERAPEUTIC EXERCISE: To improve strength, endurance, and promote reciprocal movement patterns.    NuStep - L5 x 6 min (UE/LE to promote reciprocal movement patterns)  GAIT TRAINING: To prepare to wean from AD. 270' w/o AD, initially with CGA of PT via gait belt progressing to SBA with cues for increased step length and reciprocal arm swing  NEUROMUSCULAR RE-EDUCATION: To improve balance, coordination, kinesthesia,  posture, proprioception, reduce fall risk, amplitude of movement, speed of movement to reduce bradykinesia, and reduce rigidity. Sequential reciprocal stepping over 8 low obstables/lines (yardsticks, canes, mop handle, carpet ridge, TB) x 10  PWR! Step forward and back with B UE on backs of 2 chairs to simulate parallel bars x 10 each LE PWR! Step forward w/o UE support x 10 each LE, CGA/SBA of PT via gait belt for safety PWR! Step backward w/o UE support x 10 each LE, CGA/SBA of PT for safety PWR! Step forward and back w/o UE support x 10 each LE, CGA/SBA of PT for safety - decreased amplitude of backward step on most reps   10/28/2023 THERAPEUTIC EXERCISE: To improve strength and endurance.   Rec Bike - L3 x 6 min  NEUROMUSCULAR RE-EDUCATION: To improve balance, coordination, kinesthesia, posture, proprioception, reduce fall risk, amplitude of movement, speed of movement to  reduce bradykinesia, and reduce rigidity. Seated PWR! Moves: Up x 10 Rock x 10 Twist x 10 Step x 10 Standing PWR! Moves: Up x 10 Rock x 10 Twist x 10 Step x 10 Standing RTB scap retraction + B shoulder rows 10 x 3 Standing RTB scap retraction + B shoulder extension 10 x 3  GAIT TRAINING: To normalize gait pattern, improve safety with SPC vs hiking poles, and prepare to wean to LRAD.  180' with SPC on R - cues for sequencing of SPC to promote reciprocal gait pattern including reciprocal L arm swing Curb negotiation with SPC on R and SBA of PT with VC's for sequencing 180' with hiking pole in R hand - cues for sequencing of pole to promote reciprocal gait pattern including reciprocal L arm swing 90' with B hiking poles - cues for sequencing of poles to promote reciprocal gait pattern  Curb negotiation with B hiking poles and SBA of PT with VC's for sequencing   10/21/2023 THERAPEUTIC EXERCISE: To improve strength and endurance.  Demonstration, verbal and tactile cues throughout for technique.  Rec Bike - L3 x 6 min  THERAPEUTIC ACTIVITIES: To improve functional performance.  Demonstration, verbal and tactile cues throughout for technique. LE MMT  Goal assessment  PHYSICAL PERFORMANCE TEST or MEASUREMENT: Functional Gait  Assessment  Gait Level Surface Walks 20 ft, slow speed, abnormal gait pattern, evidence for imbalance or deviates 10-15 in outside of the 12 in walkway width. Requires more than 7 sec to ambulate 20 ft.   Change in Gait Speed Able to change speed, demonstrates mild gait deviations, deviates 6-10 in outside of the 12 in walkway width, or no gait deviations, unable to achieve a major change in velocity, or uses a change in velocity, or uses an assistive device.   Gait with Horizontal Head Turns Performs head turns smoothly with slight change in gait velocity (eg, minor disruption to smooth gait path), deviates 6-10 in outside 12 in walkway width, or uses an assistive  device.   Gait with Vertical Head Turns Performs task with slight change in gait velocity (eg, minor disruption to smooth gait path), deviates 6 - 10 in outside 12 in walkway width or uses assistive device   Gait and Pivot Turn Pivot turns safely in greater than 3 sec and stops with no loss of balance, or pivot turns safely within 3 sec and stops with mild imbalance, requires small steps to catch balance.   Step Over Obstacle Is able to step over one shoe box (4.5 in total height) but must slow down and adjust steps to clear  box safely. May require verbal cueing.   Gait with Narrow Base of Support Ambulates less than 4 steps heel to toe or cannot perform without assistance.   Gait with Eyes Closed Walks 20 ft, slow speed, abnormal gait pattern, evidence for imbalance, deviates 10-15 in outside 12 in walkway width. Requires more than 9 sec to ambulate 20 ft.   Ambulating Backwards Walks 20 ft, slow speed, abnormal gait pattern, evidence for imbalance, deviates 10-15 in outside 12 in walkway width.   Steps Alternating feet, must use rail.   Total Score 14   FGA comment: < 19 = high risk fall      FGA assessment significantly limited by increased freezing of gait today  SELF CARE:  Reviewed education on tips to prevent freezing with transfers and gait, incorporating postural reset, deep breaths and count to 5, weight shifting/marching in place, and use of visual cues to increase step length to help work through freezing episodes.    10/16/2023  NEUROMUSCULAR RE-EDUCATION: To improve balance, coordination, kinesthesia, posture, proprioception, reduce fall risk, amplitude of movement, speed of movement to reduce bradykinesia, and reduce rigidity.  Seated PWR! Moves: Up 2 x 10 Rock 2 x 10 Twist 2 x 10 - boom whackers (purple) used for added reach Step 2 x 10  PWR! Sit to stand x 10  SELF CARE: Provided education to increase independence with ADLs. Provided education on the role of PWR! Moves and  correlation to everyday mobility and activities.  THERAPEUTIC ACTIVITIES: To improve functional performance.  Demonstration, verbal and tactile cues throughout for technique. : With 4WW = 8.53 sec W/o AD = 8.84 sec Gait speed: With 5TT = 3.85 ft/sec W/o AD = 3.71 ft/sec   10/09/23 THERAPEUTIC EXERCISE: To improve strength, endurance, ROM, and flexibility.  Demonstration, verbal and tactile cues throughout for technique.  NuStep - L4 x 6 min (UE/LE) S/L L bow & arrow open book stretch 10 x 5 Standing 3-way (60/90/120) doorway pec stretch 2 x 30 each position - best stretch felt at ~90 abduction Seated scap retraction with slight depression 10 x 5  MANUAL THERAPY: To promote normalized muscle tension, improved flexibility, pain modulation, and reduced pain utilizing connective tissue massage, therapeutic massage, and manual TP therapy. STM/DTM and manual TPR to L UT, LS, rhomboids, subscapularis, teres group, lats and pecs - most TTP in subscapularis and pec minor but able to achieve good muscle relaxation and reduction in pain and muscle tension in all muscles addressed today. L scapular mobilization in R S/L   SELF CARE: Provided education to prevent loss of gains achieved with physical therapy and to prevent future decline in function.  Provided instruction in self-STM techniques to posterior shoulder complex using tennis ball on wall or Theracane.    10/07/23 THERAPEUTIC EXERCISE: To improve strength, endurance, ROM, and flexibility.  Rec Bike L3x6 min  THERAPEUTIC ACTIVITIES: To improve functional performance.  Demonstration, verbal and tactile cues throughout for technique.  LE MMT Goal Assessment for re-auth  Gait for 600 ft w/o SPC- noted gradual decrease in L step length after ~350 ft   MANUAL THERAPY: To promote normalized muscle tension and reduced pain utilizing therapeutic massage, manual TP therapy, and myofascial release.  STM, TrP release to L subscapularis  (3 min)   SELF CARE: Provided education on PT POC progression, to reduce fall risk, and to prevent future decline in function. Provided education on community resources related to Parkinson's including support group and educational sessions, community-based PWR! Moves  and exercise/movement groups as well as on line resources related to PD.    09/30/23 THERAPEUTIC EXERCISE: To improve strength, endurance, ROM, and flexibility.  Recumbent Bike L3x66min Seated hip abd Blue TB x 20 Seated LAQ 3lb x 20 BLE  NEUROMUSCULAR RE-EDUCATION: To improve coordination, kinesthesia, posture, and proprioception.  Step fwd and opp arm reach x 10 BLE Step fwd and scap retraction w/ weight shift x 10 Standing rows GTB 2 x 10 staggered stance Standing shoulder ext GTB 2x10 staggered stance  Bird dog in standing x 10 B Clock balance x 5 BLE 12 to 6 o'clock Step downs from airex to floor fwd and lateral x 10 B   09/26/23 Nustep L5x59min UE/LE Standing shoulder ext RTB x 20 Seated rows RTB x 20 Standing step and reach x 10 B Lateral step and reach x 10 B Reverse chop RTB 2x10 B  Sit to stand x 20 with yellow weight ball + OHP  Seated LAQ 3lb x 20 BLE   PATIENT EDUCATION:  Education details: standardized testing results and interpretation, progress with PT, ongoing PT POC, and gait safety with SPC  Person educated: Patient Education method: Explanation, Demonstration, and Verbal cues Education comprehension: verbalized understanding, returned demonstration, verbal cues required, and needs further education   HOME EXERCISE PROGRAM: Access Code: [email or web address] URL: [email or web address] Date: 11/11/2023 Prepared by: Braylin Clark  Exercises - Seated Piriformis Stretch  - 2 x daily - 7 x weekly - 2 sets - 2 reps - 30 sec hold - Seated Piriformis Stretch  - 2 x daily - 7 x weekly - 2 sets - 2 reps - 30 sec hold - Standing Hip Abduction with Counter Support  - 1 x daily - 7 x weekly - 2 sets - 10  reps - Standing Hip Extension with Counter Support  - 1 x daily - 7 x weekly - 2 sets - 10 reps - Standing March with Counter Support  - 1 x daily - 7 x weekly - 2 sets - 10 reps - Standing Hip Flexor Stretch with Foot Elevated  - 2 x daily - 7 x weekly - 2 sets - 2 reps - 30 sec hold - Sidelying Thoracic Rotation with Open Book  - 1 x daily - 7 x weekly - 10 reps - 5 sec hold - Doorway Pec Stretch at 90 Degrees Abduction  - 2 x daily - 7 x weekly - 3 reps - 30 sec hold - Seated Scapular Retraction  - 2 x daily - 7 x weekly - 2 sets - 10 reps - 5 sec hold - Theracane Over Shoulder  - 1-2 x daily - 7 x weekly - 1-2 min hold - Single Leg Knee Extension with Weight Machine  - 1 x daily - 7 x weekly - 2 sets - 10 reps - Single Leg Hamstring Curl with Weight Machine  - 1 x daily - 7 x weekly - 2 sets - 10 reps  Patient Education - Tips to reduce freezing episodes with standing or walking   ASSESSMENT:  CLINICAL IMPRESSION: [name] [name] reports she found the [facility] 2025 Parkinson's Disease and Movement Disorders Education Symposium very informative and was glad she and her husband and sister attended. She arrives to PT today with her [facility], carrying it more than walking with it, therefore reviewed proper placement and sequencing of SPC while encouraging increased stride length and reciprocal arm swing.  She was able to safely demonstrate increased gait speed to 3.60 ft/sec with [facility]  and 3.89 ft/sec w/o AD with only rare freezing episodes today, mostly with turning and tight spaces such as doorway negotiation.  She reports freezing episodes continue to vary in frequency and intensity but when freezing does occur, she is better able to independently utilize the tips to reduce freezing.  FGA also demonstrating overall improvement today with score increased to 21/30, however increased difficulty observed with tandem gait.  [name] [name] will benefit from continued skilled PT to address ongoing abnormal muscle tension/pain  in B shoulders as indicated, as well as strength and balance deficits to improve mobility and activity tolerance with decreased pain interference, decreased episodes of freezing and decreased risk for falls.  She has ~2 weeks remaining in her current POC and will hopefully be ready to transition to her HEP at that time.   EVAL: [name] [name] is a 73 y.o. female who was referred to physical therapy for evaluation and treatment for Parkinson's disease.  She was first diagnosed with Parkinson's in 2017.  She has completed 1 prior PT episode in 2017 within the [facility] system.  Her most recent PT episode for Parkinson's disease was Aug - Dec 2024 at [facility] in [place].  Since her last PT episode, she reports changes in her mobility with worsening of shuffling gait and freezing of gait, resulting in need for increased reliance on 4WW/rollator.  She also notes increased difficulty with bed mobility of late.  Patient presents with physical impairments of decreased timing and coordination of gait, impaired ambulation, impaired standing balance, abnormal posture, bradykinesia with transfers, impaired activity tolerance, LE weakness, postural instability and decreased safety awareness impacting safe and independent functional mobility.  Examination revealed patient is at risk for falls and functional decline as evidenced by the following objective test measures: 5xSTS of 10.79 sec (>15 sec indicates increased risk for falls and decreased BLE power), Gait speed of 2.34 ft/sec with 4WW and 2.50 ft/sec w/o AD (2.62 ft/sec is needed for safe community access), TUG of 9.63 sec with 4WW and 10.50 sec w/o AD (>13.5 sec indicates increased risk for falls), TUG Manual of 10.06 sec (difference between TUG manual and TUG >4.5 seconds indicates increased fall risk), and TUG cognitive of 12.66 sec (>/= 15 seconds indicates high risk for falls and community dwelling older adults).  TUG scores of >10% difference indicate  difficulty with dual tasking.  Further testing of dynamic gait stability indicated and will be completed on next visit.  ABC scale score of 30% indicates a low level of physical functioning.  [name] [name] will benefit from skilled PT to address above deficits to improve mobility and activity tolerance to help reach the maximal level of functional independence with mobility and gait with reduced risk for falls.  Patient demonstrates understanding of this POC and is in agreement with this plan.   OBJECTIVE IMPAIRMENTS: Abnormal gait, decreased activity tolerance, decreased balance, decreased coordination, decreased knowledge of condition, decreased knowledge of use of DME, decreased mobility, difficulty walking, decreased strength, impaired perceived functional ability, increased muscle spasms, impaired flexibility, improper body mechanics, postural dysfunction, and pain.   ACTIVITY LIMITATIONS: standing, sleeping, stairs, transfers, bed mobility, bathing, locomotion level, and caring for others  PARTICIPATION LIMITATIONS: meal prep, cleaning, laundry, driving, shopping, and community activity  PERSONAL FACTORS: Fitness, Past/current experiences, Time since onset of injury/illness/exacerbation, and 3+ comorbidities: L common peroneal neuropathy (2021), headache, OA, RLS, h/o back pain are also affecting patient's functional outcome.   REHAB POTENTIAL: Good  CLINICAL DECISION MAKING: Unstable/unpredictable  EVALUATION  COMPLEXITY: High   GOALS: Goals reviewed with patient? Yes  SHORT TERM GOALS: Target date: 10/21/2023  Patient will be independent with initial HEP. Baseline:  Goal status: MET - 09/19/23  2.  Patient will demonstrate improvement in overall LE muscle strength by at least 1/2 grade on MMT. Baseline: Refer to above MMT table 10/07/23 - overall B LE strength improving  Goal status: MET - 10/21/23  3.  Patient will be educated on strategies to decrease risk of falls.  Baseline:  Goal  status: MET - 09/19/23 - education provided  4.  Patient will verbalize tips to reduce freezing/festination with gait and turns. Baseline:  09/23/23 able to verbalize some of the tips Goal status: MET - 10/07/23 - pt is able to discuss how to overcome freezing episodes and states they have helped  LONG TERM GOALS: Target date: 12/02/2023  Patient will be independent with ongoing/advanced HEP for self-management at home incorporating PWR! Moves as indicated .  Baseline:  Goal status: IN PROGRESS - 11/20/23  2.  Patient will be able to ambulate 600' with or w/o LRAD with good safety and without freezing of gait to access community.  Baseline:  10/07/23 - pt walked w/o cane, noted slightly shorter steps at times on LLE, slowed down w/ walking and talking potentially decreasing safety awareness Goal status: MET - 11/20/23   3.  Patient will be able to step up/down curb safely with or w/o LRAD for safety with community ambulation.  Baseline:  Goal status: IN PROGRESS - 10/28/23 - worked on proper sequencing with cane or hiking pole with curb ascent/descent  4.  Patient will demonstrate gait speed of >/= 2.62 ft/sec to be a safe limited community ambulator with decreased risk for recurrent falls.  Baseline: 2.34 ft/sec with 4WW, 2.50 ft/sec w/o AD 10/16/23 - With 5TT = 3.85 ft/sec, W/o AD = 3.71 ft/sec Goal status: MET - 11/20/23 - 3.60 ft/sec with SPC, 3.89 ft/sec w/o AD  5.  Patient will demonstrate at least a 4 point improvement on FGA to improve gait stability and reduce risk for falls. (MCID = 4 points) Baseline: 19/30 (09/16/23) 10/21/23 - 14/30 (limited by increased freezing of gait) Goal status: IN PROGRESS - 11/20/23 - 21/30 (19-24 = medium risk fall)   6.  Patient will report >/= 43% on ABC scale to demonstrate improved balance confidence and decreased risk for falls. Baseline: 480 / 1600 = 30.0 % Goal status: MET - 11/13/23 - 710 / 1600 = 44.4 %  7. Patient will verbalize understanding of  local Parkinson's disease community resources, including community fitness post d/c. Baseline:  Goal status: MET - 11/20/23    PLAN:  PT FREQUENCY: 2x/week  PT DURATION: 12 weeks  PLANNED INTERVENTIONS: 97164- PT Re-evaluation, 97750- Physical Performance Testing, 97110-Therapeutic exercises, 97530- Therapeutic activity, 97112- Neuromuscular re-education, 97535- Self Care, [id number]- Manual therapy, [phone]- Gait training, [phone]- Electrical stimulation (unattended), [phone]- Electrical stimulation (manual), [id number]- Ultrasound, [id number]- Ionotophoresis 4mg /ml Dexamethasone , 79439 (1-2 muscles), [id number] (3+ muscles)- Dry Needling, Patient/Family education, Balance training, Stair training, Taping, Joint mobilization, DME instructions, Cryotherapy, and Moist heat  PLAN FOR NEXT SESSION: [employer] Medicare reauthorization - update SmartPhrase below; continue PWR moves; rotational movements; address scapular pain if continues   Elijah CHRISTELLA Hidden, PT 11/20/2023, 3:28 PM    Date of referral: 08/18/23 Referring provider: Evonnie Asberry RAMAN, DO Referring diagnosis? G20.A1 (ICD-10-CM) - Parkinson's disease without dyskinesia or fluctuating manifestations (HCC) Treatment diagnosis? (if different than referring diagnosis)  Other abnormalities of  gait and mobility  Unsteadiness on feet  Muscle weakness (generalized)  What was this (referring dx) caused by? Ongoing Issue  Lysle of Condition: Chronic (continuous duration > 3 months)   Laterality: Both  Current Functional Measure Score: ABC scale 420 / 1600 = 26.3 %  Objective measurements identify impairments when they are compared to normal values, the uninvolved extremity, and prior level of function.  [x]  Yes  []  No  Objective assessment of functional ability: Moderate functional limitations   Briefly describe symptoms: [name] [name] demonstrates decreasing reliance on AD with ambulation, now using SPC more than 4WW, and often going w/o AD.  Reviewed proper placement  and sequencing of SPC while encouraging increased stride length and reciprocal arm swing.  She was able to safely demonstrate increased gait speed to 3.60 ft/sec with SPC and 3.89 ft/sec w/o AD with only rare freezing episodes today, mostly with turning and tight spaces such as doorway negotiation.  She reports freezing episodes continue to vary in frequency and intensity but when freezing does occur, she is better able to independently utilize the tips to reduce freezing.  FGA also demonstrating overall improvement today with score increased to 21/30, however increased difficulty observed with tandem gait.  [name] [name] will benefit from continued skilled PT to address ongoing abnormal muscle tension/pain in B shoulders as indicated, as well as strength and balance deficits to improve mobility and activity tolerance with decreased pain interference, decreased episodes of freezing and decreased risk for falls.  She has ~2 weeks remaining in her current POC and will hopefully be ready to transition to her HEP at that time.   How did symptoms start: PT first diagnosed in 2017 with current exacerbation of symptoms beginning in April/May of this year with increasing difficulty with mobility noted, in particular getting in and out of bed and increased shuffling and freezing of gait.  Average pain intensity:  Last 24 hours: 8/10  Past week: up to 8/10  How often does the pt experience symptoms? Frequently  How much have the symptoms interfered with usual daily activities? Quite a bit  How has condition changed since care began at this facility? A little better  In general, how is the patients overall health? Good  Onset date: PT first diagnosed in 2017 with current exacerbation of symptoms beginning in April/May 2025   BACK PAIN (STarT Back Screening Tool) - (When applicable): N/A  Has your back pain spread down your leg(s) at sometime in the last 2 weeks? []  Yes   []  No Have you had pain in the shoulder  or neck at sometime in the past 2 weeks? []  Yes   []  No Have you only walked short distances because of your back pain? []  Yes   []  No In the past 2 weeks, have you dressed more slowly than usual because of your back pain? []  Yes   []  No Do you think it is not really safe for person with a condition like yours to be physically active? []  Yes   []  No Have worrying thoughts been going through your mind a lot of the time? []  Yes   []  No Do you feel that your back pain is terrible and it is never going to get any better? []  Yes   []  No In general, have you stopped enjoying all the things you usually enjoy? []  Yes   []  No Overall, how bothersome has your back pain been in the last 2 weeks? []  Not at all   []   Slightly     []  Moderate   []  Very much     []  Extremely

## 2023-11-25 ENCOUNTER — Encounter: Payer: Self-pay | Admitting: Neurology

## 2023-11-25 ENCOUNTER — Ambulatory Visit: Admitting: Physical Therapy

## 2023-11-25 ENCOUNTER — Ambulatory Visit: Admitting: Neurology

## 2023-11-25 ENCOUNTER — Encounter: Payer: Self-pay | Admitting: Physical Therapy

## 2023-11-25 VITALS — BP 120/82 | HR 82 | Wt 155.0 lb

## 2023-11-25 DIAGNOSIS — M6281 Muscle weakness (generalized): Secondary | ICD-10-CM | POA: Diagnosis not present

## 2023-11-25 DIAGNOSIS — K5901 Slow transit constipation: Secondary | ICD-10-CM

## 2023-11-25 DIAGNOSIS — R2681 Unsteadiness on feet: Secondary | ICD-10-CM | POA: Diagnosis not present

## 2023-11-25 DIAGNOSIS — R2689 Other abnormalities of gait and mobility: Secondary | ICD-10-CM | POA: Diagnosis not present

## 2023-11-25 DIAGNOSIS — G20A1 Parkinson's disease without dyskinesia, without mention of fluctuations: Secondary | ICD-10-CM

## 2023-11-25 MED ORDER — INBRIJA 42 MG IN CAPS: 42.0000 | ORAL_CAPSULE | Freq: Every day | RESPIRATORY_TRACT | Status: AC

## 2023-11-25 NOTE — Therapy (Signed)
 OUTPATIENT PHYSICAL THERAPY PARKINSON'S TREATMENT / DISCHARGE SUMMARY   Patient Name: [name] [name] MRN: [id number] DOB:1951/02/22, 73 y.o., female Today's Date: 11/25/2023   END OF SESSION:  PT End of Session - 11/25/23 1448     Visit Number 17    Date for Recertification  12/02/23    Authorization Type [employer]    Authorization Time Period 10/07/23 - 11/25/23    Authorization - Visit Number 11    Authorization - Number of Visits 14    Progress Note Due on Visit 26   MD PN for 11/25/23 on visit #16 (11/20/23)   PT Start Time 1448    PT Stop Time 1541    PT Time Calculation (min) 53 min    Activity Tolerance Patient tolerated treatment well    Behavior During Therapy [facility] for tasks assessed/performed               Past Medical History:  Diagnosis Date   Bursitis    Cellulitis    arm   Common peroneal neuropathy, left 04/27/2019   Headache    Hyperlipidemia    Insomnia    OA (osteoarthritis)    Onychomycosis    Osteoarthritis    Parkinson disease (HCC) 03/23/2015   RLS (restless legs syndrome) 03/23/2015   Tenosynovitis    Past Surgical History:  Procedure Laterality Date   TUBAL LIGATION     Patient Active Problem List   Diagnosis Date Noted   Common peroneal neuropathy, left 04/27/2019   Upper airway cough syndrome 05/03/2015   RLS (restless legs syndrome) 03/23/2015   Parkinson disease (HCC) 03/23/2015   Fever 08/29/2011   Headache 08/29/2011   Knee pain, right 08/29/2011   Back pain 08/29/2011   Bacteremia due to Gram-positive bacteria 08/29/2011   Hepatitis 08/29/2011   Periorbital edema 08/29/2011   Facial rash 08/29/2011   Blurred vision, right eye 08/29/2011   Tremor 08/29/2011   Meningitis 08/29/2011   CHEST PAIN UNSPECIFIED 03/04/2007   PNEUMONIA, LEFT LOWER LOBE 03/03/2007   COUGH, CHRONIC 03/03/2007    PCP: Toribio Jerel MATSU, MD   REFERRING PROVIDER: Evonnie Asberry RAMAN, DO   REFERRING DIAG: G20.A1 (ICD-10-CM) - Parkinson's disease  without dyskinesia or fluctuating manifestations (HCC)  THERAPY DIAG:  Other abnormalities of gait and mobility  Unsteadiness on feet  Muscle weakness (generalized)  RATIONALE FOR EVALUATION AND TREATMENT: Rehabilitation  ONSET DATE: PD diagnosis in January 2017, worsening symptoms since April/May 2025  NEXT MD VISIT: 11/25/23   SUBJECTIVE:  SUBJECTIVE STATEMENT: Pt saw Dr.Tat today who was pleased with her progress and encouraged her to keep exercising.  Still having some issue with R shoulder/upper arm pain.    Pt accompanied by: self - sister Otho) in the waiting room  PAIN: Are you having pain? No and Yes: NPRS scale: 4/10   Pain location: R lateral shoulder/upper arm  Pain description: sore  Aggravating factors: with movement, but unpredictable  Relieving factors: stretching, lidocaine  patch   PERTINENT HISTORY:  L common peroneal neuropathy (2021), headache, OA, RLS, h/o back pain  PRECAUTIONS: None  RED FLAGS: None  WEIGHT BEARING RESTRICTIONS: No  FALLS:  Has patient fallen in last 6 months? No  LIVING ENVIRONMENT: Lives with: lives with their spouse or sister (when husband away for work) Lives in: [place] with husband, condo with sister Stairs: Yes: External: 1 steps; none - at her home Has following equipment at home: Single point cane, Environmental consultant - 4 wheeled, shower chair, and Grab bars  OCCUPATION: Retired  PLOF: Independent with household mobility with device, Independent with community mobility with device, Needs assistance with homemaking, and Leisure: scrabble, 4-5 days/week to gym until ~1 month ago  PATIENT GOALS: To be able to walk w/o the cement feeling in my feet - a steady walk w/o shuffling.   OBJECTIVE: (objective measures completed at  initial evaluation unless otherwise dated)  DIAGNOSTIC FINDINGS:  05/01/2022 - MRI cervical spine IMPRESSION:  1. Stable degenerative changes of the cervical spine with mild spinal canal stenosis at C2-3, C3-4 and C5-6.  2. Multilevel neural foraminal narrowing, severe on the right at C5-6 and moderate on the right at C3-4 and C7-T1.   COGNITION: Overall cognitive status: Within functional limits for tasks assessed   SENSATION: WFL Numbness in L great toe (intermittent)  COORDINATION: Gross motor - mild slowing  EDEMA:  Not recently  MUSCLE TONE: LLE: Mild into L ankle inversion  POSTURE:  rounded shoulders, forward head, flexed trunk , and L ankle drawing into inversion  MUSCLE LENGTH: Hamstrings: Mild tight B ITB: Mild tight L>R Piriformis: Mod tight B Hip flexors: Mod/severe tight B Quads: Mild/mod tight B Heelcord: Mild/mod tight B  LOWER EXTREMITY ROM:    Grossly WFL other than restrictions as noted above in muscle length  LOWER EXTREMITY MMT:    MMT Right eval Left eval R 10/07/23 L 10/07/23 R 10/21/23 L 10/21/23 R 11/25/23 L 11/25/23  Hip flexion 4 4 4- 4 4+ 4+ 5 5  Hip extension 3- 3- 3+ 3+ 3+ 3+ 4- 4-  Hip abduction 3 3 3+ 3+ 4- 4- 4- 4-  Hip adduction 4- 4- 4- 3+ 4 4 4 4   Hip internal rotation 4 4 4+ 4+ 4+ 4+ 4+ 4+  Hip external rotation 3+ 3+ 4 4 4+ 4+ 4+ 4+  Knee flexion 4+ 4 4+ 4+ 5 5 5 5   Knee extension 4+ 4+ 5 5 5 5 5 5   Ankle dorsiflexion 4 4- 4- 4+ 4+ 4+ 5 5  Ankle plantarflexion 5 4+  5 4+      Ankle inversion 4- 3+ 4 4 4 4  4+ 4+  Ankle eversion 4 4- 4 4 4 4 4 4   (Blank rows = not tested)  BED MOBILITY:  Sit to supine SBA Supine to sit SBA Rolling to Right SBA Rolling to Left SBA  TRANSFERS: Assistive device utilized: Environmental consultant - 4 wheeled and None  Sit to stand: Modified independence Stand to sit: Modified independence+ Chair to chair:  Modified independence Floor: NT  GAIT: Distance walked: Clinic distances Assistive device utilized:  Environmental consultant - 4 wheeled and None Level of assistance: Modified independence and SBA Gait pattern: step through pattern, decreased stride length, decreased hip/knee flexion- Right, decreased hip/knee flexion- Left, shuffling, and trunk flexed Comments: No freezing demonstrated during initial eval assessment however patient reports increasing episodes of freezing of gait recently  FUNCTIONAL TESTS:  5 times sit to stand: 10.79 sec w/o UE assist Timed up and go (TUG): 9.63 sec with 4WW; 10.50 sec w/o AD (normal), 10.06 sec (manual), 12.66 sec (cognitive - shifted from counting back by 3's to 2's mid test) 10 meter walk test: 14.00 sec with 4WW, 13.10 sec w/o AD Gait speed: 2.34 ft/sec with 4WW, 2.50 ft/sec w/o AD Functional gait assessment: 19/30 (09/16/23) Functional Gait Assessment  Gait assessed  Yes   Gait Level Surface Walks 20 ft, slow speed, abnormal gait pattern, evidence for imbalance or deviates 10-15 in outside of the 12 in walkway width. Requires more than 7 sec to ambulate 20 ft.   Change in Gait Speed Able to change speed, demonstrates mild gait deviations, deviates 6-10 in outside of the 12 in walkway width, or no gait deviations, unable to achieve a major change in velocity, or uses a change in velocity, or uses an assistive device.   Gait with Horizontal Head Turns Performs head turns smoothly with slight change in gait velocity (eg, minor disruption to smooth gait path), deviates 6-10 in outside 12 in walkway width, or uses an assistive device.   Gait with Vertical Head Turns Performs task with slight change in gait velocity (eg, minor disruption to smooth gait path), deviates 6 - 10 in outside 12 in walkway width or uses assistive device   Gait and Pivot Turn Pivot turns safely within 3 sec and stops quickly with no loss of balance.   Step Over Obstacle Is able to step over one shoe box (4.5 in total height) without changing gait speed. No evidence of imbalance.   Gait with Narrow Base  of Support Ambulates 7-9 steps.   Gait with Eyes Closed Walks 20 ft, uses assistive device, slower speed, mild gait deviations, deviates 6-10 in outside 12 in walkway width. Ambulates 20 ft in less than 9 sec but greater than 7 sec.   Ambulating Backwards Walks 20 ft, slow speed, abnormal gait pattern, evidence for imbalance, deviates 10-15 in outside 12 in walkway width.   Steps Alternating feet, must use rail.   Total Score 19      FGA Interpretation of scores: Non-Specific Older Adults Cutoff Score: <=22/30 = risk of falls Parkinson's Disease Cutoff score <15/30 = fall risk (Hoehn & Yahr 1-4) Minimally Clinically Important Difference (MCID)  Stroke (acute, subacute, and chronic) = MDC: 4.2 points Vestibular (acute) = MDC: 6 points Community Dwelling Older Adults =  MCID: 4 points Parkinson's Disease  =  MDC: 4.3 points  PATIENT SURVEYS:  ABC scale: 480 / 1600 = 30.0 %, <69% indicates risk for recurrent falls in PD and <50% indicates a low level of physical functioning; 420 / 1600 = 26.3 % (09/30/23)   TODAY'S TREATMENT:   11/25/2023 THERAPEUTIC EXERCISE: To improve strength and endurance.  Demonstration, verbal and tactile cues throughout for technique.  Rec Bike - L3 x 6 min Verbally reviewed current HEP clarifying recommended frequency for ongoing performance to maintain gains achieved with PT and prevent future decline in function.   GAIT TRAINING: To improve safety with SPC. Reviewed  gait sequencing including curb negotiation with SPC with patient able to independently ascend and descend curb without LOB.  PHYSICAL PERFORMANCE TEST or MEASUREMENT: 5xSTS = 9.53 sec TUG: Normal = 8.52 sec w/o AD, 10.88 sec with SPC (slight freezing at turn) Manual = 11.63 sec w/o AD Cognitive = 9.44 sec w/o AD (counting backwards by 1's) Functional Gait  Assessment  Gait Level Surface Walks 20 ft in less than 5.5 sec, no assistive devices, good speed, no evidence for imbalance, normal gait  pattern, deviates no more than 6 in outside of the 12 in walkway width.   Change in Gait Speed Able to smoothly change walking speed without loss of balance or gait deviation. Deviate no more than 6 in outside of the 12 in walkway width.   Gait with Horizontal Head Turns Performs head turns smoothly with slight change in gait velocity (eg, minor disruption to smooth gait path), deviates 6-10 in outside 12 in walkway width, or uses an assistive device.   Gait with Vertical Head Turns Performs head turns with no change in gait. Deviates no more than 6 in outside 12 in walkway width.   Gait and Pivot Turn Pivot turns safely within 3 sec and stops quickly with no loss of balance.   Step Over Obstacle Is able to step over one shoe box (4.5 in total height) without changing gait speed. No evidence of imbalance.   Gait with Narrow Base of Support Ambulates 4-7 steps.   Gait with Eyes Closed Walks 20 ft, no assistive devices, good speed, no evidence of imbalance, normal gait pattern, deviates no more than 6 in outside 12 in walkway width. Ambulates 20 ft in less than 7 sec.   Ambulating Backwards Walks 20 ft, uses assistive device, slower speed, mild gait deviations, deviates 6-10 in outside 12 in walkway width.   Steps Alternating feet, must use rail.   Total Score 24   FGA comment: 19-24 = medium risk fall       11/20/2023 THERAPEUTIC EXERCISE: To improve strength and endurance.   Rec Bike - L3 x 6 min  PHYSICAL PERFORMANCE TEST or MEASUREMENT: Functional Gait  Assessment  Gait Level Surface Walks 20 ft in less than 7 sec but greater than 5.5 sec, uses assistive device, slower speed, mild gait deviations, or deviates 6-10 in outside of the 12 in walkway width.   Change in Gait Speed Able to smoothly change walking speed without loss of balance or gait deviation. Deviate no more than 6 in outside of the 12 in walkway width.   Gait with Horizontal Head Turns Performs head turns smoothly with slight  change in gait velocity (eg, minor disruption to smooth gait path), deviates 6-10 in outside 12 in walkway width, or uses an assistive device.   Gait with Vertical Head Turns Performs head turns with no change in gait. Deviates no more than 6 in outside 12 in walkway width.   Gait and Pivot Turn Pivot turns safely within 3 sec and stops quickly with no loss of balance.   Step Over Obstacle Is able to step over one shoe box (4.5 in total height) without changing gait speed. No evidence of imbalance.   Gait with Narrow Base of Support Ambulates 4-7 steps.   Gait with Eyes Closed Walks 20 ft, slow speed, abnormal gait pattern, evidence for imbalance, deviates 10-15 in outside 12 in walkway width. Requires more than 9 sec to ambulate 20 ft.   Ambulating Backwards Walks 20 ft, uses  assistive device, slower speed, mild gait deviations, deviates 6-10 in outside 12 in walkway width.   Steps Alternating feet, must use rail.   Total Score 21   FGA comment: 19-24 = medium risk fall       GAIT TRAINING: To normalize gait pattern, improve safety with SPC, and prepare to wean to LRAD. 600' with SPC on right and SBA/supervision of PT - initial cues for reciprocal arm swing/SPC advancement while maintaining symmetrical increased stride length : 9.12 sec with SPC 8.43 sec w/o AD Gait speed: 3.60 ft/sec with SPC 3.89 ft/sec w/o AD   11/13/23 Bike L2x7min STM to B UT, levator, rhomboids ABC scale   11/11/2023 THERAPEUTIC EXERCISE: To improve strength, endurance, and promote reciprocal movement patterns.    Recumbent BIKE L3x70min Leg curls 20lb x 20 BLE Leg extension 10lb x 20 BLE Rows 15lb BUE NEUROMUSCULAR RE-EDUCATION: To improve balance, coordination, kinesthesia, posture, proprioception, reduce fall risk, amplitude of movement, speed of movement to reduce bradykinesia, and reduce rigidity. PWR! Twist standing x 20 each way PWR up x 10  PWR rock x 10 both ways PWR twist x 10 both ways PWR  step x 10 both ways Clock reach standing on airex pad R/L 5x  Standing D1 ext GTB R/L UE with trunk rotation x 10   11/05/2023 THERAPEUTIC EXERCISE: To improve strength, endurance, and promote reciprocal movement patterns.    NuStep - L5 x 6 min (UE/LE to promote reciprocal movement patterns) Trunk extension at wall  NEUROMUSCULAR RE-EDUCATION: To improve balance, coordination, kinesthesia, posture, proprioception, reduce fall risk, amplitude of movement, speed of movement to reduce bradykinesia, and reduce rigidity. PWR! Step forward w/o UE support 2 x 10 each LE,  PWR! Step backward w/o UE support 2 x 10 each LE,  PWR! Step laterally w/ weight shift 2 x 10 BLE PWR! Twist standing x 10 each way Standing D1 ext GTB R/L UE with trunk rotation x 10   10/30/2023 THERAPEUTIC EXERCISE: To improve strength, endurance, and promote reciprocal movement patterns.    NuStep - L5 x 6 min (UE/LE to promote reciprocal movement patterns)  GAIT TRAINING: To prepare to wean from AD. 270' w/o AD, initially with CGA of PT via gait belt progressing to SBA with cues for increased step length and reciprocal arm swing  NEUROMUSCULAR RE-EDUCATION: To improve balance, coordination, kinesthesia, posture, proprioception, reduce fall risk, amplitude of movement, speed of movement to reduce bradykinesia, and reduce rigidity. Sequential reciprocal stepping over 8 low obstables/lines (yardsticks, canes, mop handle, carpet ridge, TB) x 10  PWR! Step forward and back with B UE on backs of 2 chairs to simulate parallel bars x 10 each LE PWR! Step forward w/o UE support x 10 each LE, CGA/SBA of PT via gait belt for safety PWR! Step backward w/o UE support x 10 each LE, CGA/SBA of PT for safety PWR! Step forward and back w/o UE support x 10 each LE, CGA/SBA of PT for safety - decreased amplitude of backward step on most reps   10/28/2023 THERAPEUTIC EXERCISE: To improve strength and endurance.   Rec Bike - L3 x 6  min  NEUROMUSCULAR RE-EDUCATION: To improve balance, coordination, kinesthesia, posture, proprioception, reduce fall risk, amplitude of movement, speed of movement to reduce bradykinesia, and reduce rigidity. Seated PWR! Moves: Up x 10 Rock x 10 Twist x 10 Step x 10 Standing PWR! Moves: Up x 10 Rock x 10 Twist x 10 Step x 10 Standing RTB scap retraction + B shoulder  rows 10 x 3 Standing RTB scap retraction + B shoulder extension 10 x 3  GAIT TRAINING: To normalize gait pattern, improve safety with SPC vs hiking poles, and prepare to wean to LRAD.  180' with SPC on R - cues for sequencing of SPC to promote reciprocal gait pattern including reciprocal L arm swing Curb negotiation with SPC on R and SBA of PT with VC's for sequencing 180' with hiking pole in R hand - cues for sequencing of pole to promote reciprocal gait pattern including reciprocal L arm swing 90' with B hiking poles - cues for sequencing of poles to promote reciprocal gait pattern  Curb negotiation with B hiking poles and SBA of PT with VC's for sequencing   10/21/2023 THERAPEUTIC EXERCISE: To improve strength and endurance.  Demonstration, verbal and tactile cues throughout for technique.  Rec Bike - L3 x 6 min  THERAPEUTIC ACTIVITIES: To improve functional performance.  Demonstration, verbal and tactile cues throughout for technique. LE MMT  Goal assessment  PHYSICAL PERFORMANCE TEST or MEASUREMENT: Functional Gait  Assessment  Gait Level Surface Walks 20 ft, slow speed, abnormal gait pattern, evidence for imbalance or deviates 10-15 in outside of the 12 in walkway width. Requires more than 7 sec to ambulate 20 ft.   Change in Gait Speed Able to change speed, demonstrates mild gait deviations, deviates 6-10 in outside of the 12 in walkway width, or no gait deviations, unable to achieve a major change in velocity, or uses a change in velocity, or uses an assistive device.   Gait with Horizontal Head Turns Performs  head turns smoothly with slight change in gait velocity (eg, minor disruption to smooth gait path), deviates 6-10 in outside 12 in walkway width, or uses an assistive device.   Gait with Vertical Head Turns Performs task with slight change in gait velocity (eg, minor disruption to smooth gait path), deviates 6 - 10 in outside 12 in walkway width or uses assistive device   Gait and Pivot Turn Pivot turns safely in greater than 3 sec and stops with no loss of balance, or pivot turns safely within 3 sec and stops with mild imbalance, requires small steps to catch balance.   Step Over Obstacle Is able to step over one shoe box (4.5 in total height) but must slow down and adjust steps to clear box safely. May require verbal cueing.   Gait with Narrow Base of Support Ambulates less than 4 steps heel to toe or cannot perform without assistance.   Gait with Eyes Closed Walks 20 ft, slow speed, abnormal gait pattern, evidence for imbalance, deviates 10-15 in outside 12 in walkway width. Requires more than 9 sec to ambulate 20 ft.   Ambulating Backwards Walks 20 ft, slow speed, abnormal gait pattern, evidence for imbalance, deviates 10-15 in outside 12 in walkway width.   Steps Alternating feet, must use rail.   Total Score 14   FGA comment: < 19 = high risk fall      FGA assessment significantly limited by increased freezing of gait today  SELF CARE:  Reviewed education on tips to prevent freezing with transfers and gait, incorporating postural reset, deep breaths and count to 5, weight shifting/marching in place, and use of visual cues to increase step length to help work through freezing episodes.    10/16/2023  NEUROMUSCULAR RE-EDUCATION: To improve balance, coordination, kinesthesia, posture, proprioception, reduce fall risk, amplitude of movement, speed of movement to reduce bradykinesia, and reduce rigidity.  Seated  PWR! Moves: Up 2 x 10 Rock 2 x 10 Twist 2 x 10 - boom whackers (purple) used for  added reach Step 2 x 10  PWR! Sit to stand x 10  SELF CARE: Provided education to increase independence with ADLs. Provided education on the role of PWR! Moves and correlation to everyday mobility and activities.  THERAPEUTIC ACTIVITIES: To improve functional performance.  Demonstration, verbal and tactile cues throughout for technique. : With 4WW = 8.53 sec W/o AD = 8.84 sec Gait speed: With 5TT = 3.85 ft/sec W/o AD = 3.71 ft/sec   10/09/23 THERAPEUTIC EXERCISE: To improve strength, endurance, ROM, and flexibility.  Demonstration, verbal and tactile cues throughout for technique.  NuStep - L4 x 6 min (UE/LE) S/L L bow & arrow open book stretch 10 x 5 Standing 3-way (60/90/120) doorway pec stretch 2 x 30 each position - best stretch felt at ~90 abduction Seated scap retraction with slight depression 10 x 5  MANUAL THERAPY: To promote normalized muscle tension, improved flexibility, pain modulation, and reduced pain utilizing connective tissue massage, therapeutic massage, and manual TP therapy. STM/DTM and manual TPR to L UT, LS, rhomboids, subscapularis, teres group, lats and pecs - most TTP in subscapularis and pec minor but able to achieve good muscle relaxation and reduction in pain and muscle tension in all muscles addressed today. L scapular mobilization in R S/L   SELF CARE: Provided education to prevent loss of gains achieved with physical therapy and to prevent future decline in function.  Provided instruction in self-STM techniques to posterior shoulder complex using tennis ball on wall or Theracane.    10/07/23 THERAPEUTIC EXERCISE: To improve strength, endurance, ROM, and flexibility.  Rec Bike L3x6 min  THERAPEUTIC ACTIVITIES: To improve functional performance.  Demonstration, verbal and tactile cues throughout for technique.  LE MMT Goal Assessment for re-auth  Gait for 600 ft w/o SPC- noted gradual decrease in L step length after ~350 ft   MANUAL  THERAPY: To promote normalized muscle tension and reduced pain utilizing therapeutic massage, manual TP therapy, and myofascial release.  STM, TrP release to L subscapularis (3 min)   SELF CARE: Provided education on PT POC progression, to reduce fall risk, and to prevent future decline in function. Provided education on community resources related to Parkinson's including support group and educational sessions, community-based PWR! Moves and exercise/movement groups as well as on line resources related to PD.    09/30/23 THERAPEUTIC EXERCISE: To improve strength, endurance, ROM, and flexibility.  Recumbent Bike L3x48min Seated hip abd Blue TB x 20 Seated LAQ 3lb x 20 BLE  NEUROMUSCULAR RE-EDUCATION: To improve coordination, kinesthesia, posture, and proprioception.  Step fwd and opp arm reach x 10 BLE Step fwd and scap retraction w/ weight shift x 10 Standing rows GTB 2 x 10 staggered stance Standing shoulder ext GTB 2x10 staggered stance  Bird dog in standing x 10 B Clock balance x 5 BLE 12 to 6 o'clock Step downs from airex to floor fwd and lateral x 10 B   09/26/23 Nustep L5x32min UE/LE Standing shoulder ext RTB x 20 Seated rows RTB x 20 Standing step and reach x 10 B Lateral step and reach x 10 B Reverse chop RTB 2x10 B  Sit to stand x 20 with yellow weight ball + OHP  Seated LAQ 3lb x 20 BLE   PATIENT EDUCATION:  Education details: standardized testing results and interpretation, progress with PT, HEP review, recommended frequency for ongoing HEP at discharge to  prevent loss of gains achieved with PT, and gait safety with [facility]  Person educated: Patient Education method: Explanation, Demonstration, and Verbal cues Education comprehension: verbalized understanding, returned demonstration, and verbal cues required   HOME EXERCISE PROGRAM: Access Code: [email or web address] URL: [email or web address] Date: 11/11/2023 Prepared by: Braylin Clark  Exercises - Seated Piriformis  Stretch  - 2 x daily - 7 x weekly - 2 sets - 2 reps - 30 sec hold - Seated Piriformis Stretch  - 2 x daily - 7 x weekly - 2 sets - 2 reps - 30 sec hold - Standing Hip Abduction with Counter Support  - 1 x daily - 7 x weekly - 2 sets - 10 reps - Standing Hip Extension with Counter Support  - 1 x daily - 7 x weekly - 2 sets - 10 reps - Standing March with Counter Support  - 1 x daily - 7 x weekly - 2 sets - 10 reps - Standing Hip Flexor Stretch with Foot Elevated  - 2 x daily - 7 x weekly - 2 sets - 2 reps - 30 sec hold - Sidelying Thoracic Rotation with Open Book  - 1 x daily - 7 x weekly - 10 reps - 5 sec hold - Doorway Pec Stretch at 90 Degrees Abduction  - 2 x daily - 7 x weekly - 3 reps - 30 sec hold - Seated Scapular Retraction  - 2 x daily - 7 x weekly - 2 sets - 10 reps - 5 sec hold - Theracane Over Shoulder  - 1-2 x daily - 7 x weekly - 1-2 min hold - Single Leg Knee Extension with Weight Machine  - 1 x daily - 7 x weekly - 2 sets - 10 reps - Single Leg Hamstring Curl with Weight Machine  - 1 x daily - 7 x weekly - 2 sets - 10 reps  Patient Education - Tips to reduce freezing episodes with standing or walking   ASSESSMENT:  CLINICAL IMPRESSION: Zariana reports Dr. Evonnie was pleased with her progress with PT and encouraged her to continue with regular exercise.  Completed remaining goal assessment as well as reassessment of FGA with continued improvement noted and all goals now met.  HEP verbally reviewed, deferring shoulder exercises until she sees MD on Friday for her shoulder problem and clarifying recommended frequency for ongoing performance of remaining exercises to maintain gains achieved with PT and prevent future decline in function.  As today was the last authorized date in her current insurance authorization and all PT goals now met, Jazira will be transitioning to her HEP and will proceed with discharge from physical therapy for this episode.  Will plan to have her return in 6  months for a follow-up PD screen.  She will be seeing a MD on Friday to address her R shoulder pain and she is aware that that if skilled PT needs identified for her R shoulder when she sees the doctor, a new referral and eval may be completed as indicated by the doctor.  EVAL: Wetona P Strite is a 73 y.o. female who was referred to physical therapy for evaluation and treatment for Parkinson's disease.  She was first diagnosed with Parkinson's in 2017.  She has completed 1 prior PT episode in 2017 within the [facility] system.  Her most recent PT episode for Parkinson's disease was Aug - Dec 2024 at [facility] in [place].  Since her last PT episode, she reports changes in her mobility with  worsening of shuffling gait and freezing of gait, resulting in need for increased reliance on 4WW/rollator.  She also notes increased difficulty with bed mobility of late.  Patient presents with physical impairments of decreased timing and coordination of gait, impaired ambulation, impaired standing balance, abnormal posture, bradykinesia with transfers, impaired activity tolerance, LE weakness, postural instability and decreased safety awareness impacting safe and independent functional mobility.  Examination revealed patient is at risk for falls and functional decline as evidenced by the following objective test measures: 5xSTS of 10.79 sec (>15 sec indicates increased risk for falls and decreased BLE power), Gait speed of 2.34 ft/sec with 4WW and 2.50 ft/sec w/o AD (2.62 ft/sec is needed for safe community access), TUG of 9.63 sec with 4WW and 10.50 sec w/o AD (>13.5 sec indicates increased risk for falls), TUG Manual of 10.06 sec (difference between TUG manual and TUG >4.5 seconds indicates increased fall risk), and TUG cognitive of 12.66 sec (>/= 15 seconds indicates high risk for falls and community dwelling older adults).  TUG scores of >10% difference indicate difficulty with dual tasking.  Further testing of dynamic  gait stability indicated and will be completed on next visit.  ABC scale score of 30% indicates a low level of physical functioning.  Kash will benefit from skilled PT to address above deficits to improve mobility and activity tolerance to help reach the maximal level of functional independence with mobility and gait with reduced risk for falls.  Patient demonstrates understanding of this POC and is in agreement with this plan.   OBJECTIVE IMPAIRMENTS: Abnormal gait, decreased activity tolerance, decreased balance, decreased coordination, decreased knowledge of condition, decreased knowledge of use of DME, decreased mobility, difficulty walking, decreased strength, impaired perceived functional ability, increased muscle spasms, impaired flexibility, improper body mechanics, postural dysfunction, and pain.   ACTIVITY LIMITATIONS: standing, sleeping, stairs, transfers, bed mobility, bathing, locomotion level, and caring for others  PARTICIPATION LIMITATIONS: meal prep, cleaning, laundry, driving, shopping, and community activity  PERSONAL FACTORS: Fitness, Past/current experiences, Time since onset of injury/illness/exacerbation, and 3+ comorbidities: L common peroneal neuropathy (2021), headache, OA, RLS, h/o back pain are also affecting patient's functional outcome.   REHAB POTENTIAL: Good  CLINICAL DECISION MAKING: Unstable/unpredictable  EVALUATION COMPLEXITY: High   GOALS: Goals reviewed with patient? Yes  SHORT TERM GOALS: Target date: 10/21/2023  Patient will be independent with initial HEP. Baseline:  Goal status: MET - 09/19/23  2.  Patient will demonstrate improvement in overall LE muscle strength by at least 1/2 grade on MMT. Baseline: Refer to above MMT table 10/07/23 - overall B LE strength improving  Goal status: MET - 10/21/23  3.  Patient will be educated on strategies to decrease risk of falls.  Baseline:  Goal status: MET - 09/19/23 - education provided  4.  Patient  will verbalize tips to reduce freezing/festination with gait and turns. Baseline:  09/23/23 able to verbalize some of the tips Goal status: MET - 10/07/23 - pt is able to discuss how to overcome freezing episodes and states they have helped  LONG TERM GOALS: Target date: 12/02/2023  Patient will be independent with ongoing/advanced HEP for self-management at home incorporating PWR! Moves as indicated .  Baseline:  Goal status: MET - 11/25/23  2.  Patient will be able to ambulate 600' with or w/o LRAD with good safety and without freezing of gait to access community.  Baseline:  10/07/23 - pt walked w/o cane, noted slightly shorter steps at times on LLE, slowed down  w/ walking and talking potentially decreasing safety awareness Goal status: MET - 11/20/23   3.  Patient will be able to step up/down curb safely with or w/o LRAD for safety with community ambulation.  Baseline:  10/28/23 - worked on proper sequencing with cane or hiking pole with curb ascent/descent Goal status: MET - 11/25/23   4.  Patient will demonstrate gait speed of >/= 2.62 ft/sec to be a safe limited community ambulator with decreased risk for recurrent falls.  Baseline: 2.34 ft/sec with 4WW, 2.50 ft/sec w/o AD 10/16/23 - With 5TT = 3.85 ft/sec, W/o AD = 3.71 ft/sec Goal status: MET - 11/20/23 - 3.60 ft/sec with SPC, 3.89 ft/sec w/o AD  5.  Patient will demonstrate at least a 4 point improvement on FGA to improve gait stability and reduce risk for falls. (MCID = 4 points) Baseline: 19/30 (09/16/23) 10/21/23 - 14/30 (limited by increased freezing of gait) 11/20/23 - 21/30 (19-24 = medium risk fall)  Goal status: MET - 11/25/23 - 24/30 (19-24 = medium risk fall)  6.  Patient will report >/= 43% on ABC scale to demonstrate improved balance confidence and decreased risk for falls. Baseline: 480 / 1600 = 30.0 % Goal status: MET - 11/13/23 - 710 / 1600 = 44.4 %  7. Patient will verbalize understanding of local Parkinson's disease  community resources, including community fitness post d/c. Baseline:  Goal status: MET - 11/20/23    PLAN:  PT FREQUENCY: 2x/week  PT DURATION: 12 weeks  PLANNED INTERVENTIONS: 97164- PT Re-evaluation, 97750- Physical Performance Testing, 97110-Therapeutic exercises, 97530- Therapeutic activity, [id number]- Neuromuscular re-education, 97535- Self Care, [id number]- Manual therapy, [id number]- Gait training, [phone]- Electrical stimulation (unattended), [id number]- Electrical stimulation (manual), [id number]- Ultrasound, [id number]- Ionotophoresis 4mg /ml Dexamethasone , 79439 (1-2 muscles), 20561 (3+ muscles)- Dry Needling, Patient/Family education, Balance training, Stair training, Taping, Joint mobilization, DME instructions, Cryotherapy, and Moist heat  PLAN FOR NEXT SESSION: Transition to HEP + DC from PT; patient to return in ~6 months for follow-up PD PT screen   PHYSICAL THERAPY DISCHARGE SUMMARY  Visits from Start of Care: 17  Current functional level related to goals / functional outcomes: Refer to above clinical impression and goal assessment.   Remaining deficits: R shoulder pain - Linzie has an appointment to see a doctor on Friday, 11/28/2023 for this   Education / Equipment: HEP, PWR! Moves, tips to reduce freezing with gait and mobility  Patient agrees to discharge. Patient goals were met. Patient is being discharged due to meeting the stated rehab goals.   Elijah CHRISTELLA Hidden, PT 11/25/2023, 8:02 PM    Date of referral: 08/18/23 Referring provider: Evonnie Asberry RAMAN, DO Referring diagnosis? G20.A1 (ICD-10-CM) - Parkinson's disease without dyskinesia or fluctuating manifestations (HCC) Treatment diagnosis? (if different than referring diagnosis)  No diagnosis found.  What was this (referring dx) caused by? Ongoing Issue  Lysle of Condition: Chronic (continuous duration > 3 months)   Laterality: Both  Current Functional Measure Score: ABC scale 420 / 1600 = 26.3 %  Objective measurements identify  impairments when they are compared to normal values, the uninvolved extremity, and prior level of function.  [x]  Yes  []  No  Objective assessment of functional ability: Moderate functional limitations   Briefly describe symptoms: Saoirse demonstrates decreasing reliance on AD with ambulation, now using SPC more than 4WW, and often going w/o AD.  Reviewed proper placement and sequencing of SPC while encouraging increased stride length and reciprocal arm swing.  She was able to safely demonstrate increased  gait speed to 3.60 ft/sec with SPC and 3.89 ft/sec w/o AD with only rare freezing episodes today, mostly with turning and tight spaces such as doorway negotiation.  She reports freezing episodes continue to vary in frequency and intensity but when freezing does occur, she is better able to independently utilize the tips to reduce freezing.  FGA also demonstrating overall improvement today with score increased to 21/30, however increased difficulty observed with tandem gait.  Allyn will benefit from continued skilled PT to address ongoing abnormal muscle tension/pain in B shoulders as indicated, as well as strength and balance deficits to improve mobility and activity tolerance with decreased pain interference, decreased episodes of freezing and decreased risk for falls.  She has ~2 weeks remaining in her current POC and will hopefully be ready to transition to her HEP at that time.   How did symptoms start: PT first diagnosed in 2017 with current exacerbation of symptoms beginning in April/May of this year with increasing difficulty with mobility noted, in particular getting in and out of bed and increased shuffling and freezing of gait.  Average pain intensity:  Last 24 hours: 8/10  Past week: up to 8/10  How often does the pt experience symptoms? Frequently  How much have the symptoms interfered with usual daily activities? Quite a bit  How has condition changed since care began at this facility?  A little better  In general, how is the patients overall health? Good  Onset date: PT first diagnosed in 2017 with current exacerbation of symptoms beginning in April/May 2025   BACK PAIN (STarT Back Screening Tool) - (When applicable): N/A  Has your back pain spread down your leg(s) at sometime in the last 2 weeks? []  Yes   []  No Have you had pain in the shoulder or neck at sometime in the past 2 weeks? []  Yes   []  No Have you only walked short distances because of your back pain? []  Yes   []  No In the past 2 weeks, have you dressed more slowly than usual because of your back pain? []  Yes   []  No Do you think it is not really safe for person with a condition like yours to be physically active? []  Yes   []  No Have worrying thoughts been going through your mind a lot of the time? []  Yes   []  No Do you feel that your back pain is terrible and it is never going to get any better? []  Yes   []  No In general, have you stopped enjoying all the things you usually enjoy? []  Yes   []  No Overall, how bothersome has your back pain been in the last 2 weeks? []  Not at all   []  Slightly     []  Moderate   []  Very much     []  Extremely

## 2023-11-25 NOTE — Patient Instructions (Signed)
We are going to start inbrija.  Remember that TWO capsules is ONE dosage (never inhale just one capsule).  You can inhale the capsules as needed up to 5 times per day, separated by 2 hour intervals.  Many patients use this right when the wake up to help with first morning "on" and then as needed during the day.  You should take a sip of water prior to using the inhaler to avoid side effects.  It may generate some cough right when you use it and that is normal.  There are nurse educators available to help you with this device.  You can call [phone] and they will set you up with a nurse educator to assist you for free of charge.  They are available 8am-8pm Monday-Friday.  

## 2023-11-27 ENCOUNTER — Encounter: Admitting: Physical Therapy

## 2023-11-28 DIAGNOSIS — M7521 Bicipital tendinitis, right shoulder: Secondary | ICD-10-CM | POA: Diagnosis not present

## 2023-11-28 DIAGNOSIS — M25511 Pain in right shoulder: Secondary | ICD-10-CM | POA: Diagnosis not present

## 2023-11-28 DIAGNOSIS — Z6824 Body mass index (BMI) 24.0-24.9, adult: Secondary | ICD-10-CM | POA: Diagnosis not present

## 2023-12-02 ENCOUNTER — Ambulatory Visit: Admitting: Physical Therapy

## 2023-12-23 NOTE — Therapy (Incomplete)
 OUTPATIENT PHYSICAL THERAPY SHOULDER EVALUATION   Patient Name: [name] [name] MRN: [id number] DOB:Nov 26, 1950, 73 y.o., female Today's Date: 12/23/2023   END OF SESSION:   Past Medical History:  Diagnosis Date   Bursitis    Cellulitis    arm   Common peroneal neuropathy, left 04/27/2019   Headache    Hyperlipidemia    Insomnia    OA (osteoarthritis)    Onychomycosis    Osteoarthritis    Parkinson disease (HCC) 03/23/2015   RLS (restless legs syndrome) 03/23/2015   Tenosynovitis    Past Surgical History:  Procedure Laterality Date   TUBAL LIGATION     Patient Active Problem List   Diagnosis Date Noted   Common peroneal neuropathy, left 04/27/2019   Upper airway cough syndrome 05/03/2015   RLS (restless legs syndrome) 03/23/2015   Parkinson disease (HCC) 03/23/2015   Fever 08/29/2011   Headache 08/29/2011   Knee pain, right 08/29/2011   Back pain 08/29/2011   Bacteremia due to Gram-positive bacteria 08/29/2011   Hepatitis 08/29/2011   Periorbital edema 08/29/2011   Facial rash 08/29/2011   Blurred vision, right eye 08/29/2011   Tremor 08/29/2011   Meningitis 08/29/2011   CHEST PAIN UNSPECIFIED 03/04/2007   PNEUMONIA, LEFT LOWER LOBE 03/03/2007   COUGH, CHRONIC 03/03/2007    PCP: Toribio Jerel MATSU, MD   REFERRING PROVIDER: Trudy Vaughn FALCON, MD   REFERRING DIAG:  M25.511 (ICD-10-CM) - Pain in right shoulder  M75.21 (ICD-10-CM) - Bicipital tendinitis, right shoulder   THERAPY DIAG:  No diagnosis found.  RATIONALE FOR EVALUATION AND TREATMENT: Rehabilitation  ONSET DATE: July/August 2025  NEXT MD VISIT: ***PRN   SUBJECTIVE:                                                                                                                                                                                                         SUBJECTIVE STATEMENT: ***  PAIN: Are you having pain? {OPRCPAIN:27236}  PERTINENT HISTORY:  ***Parkinson's disease, L common  peroneal neuropathy (2021), headache, OA, RLS, h/o back pain   PRECAUTIONS: {Therapy precautions:24002}  RED FLAGS: {PT Red Flags:29287}  HAND DOMINANCE: {Hand Dominance:29389}  WEIGHT BEARING RESTRICTIONS: {Yes ***/No:24003}  FALLS:  Has patient fallen in last 6 months? {fallsyesno:27318}  LIVING ENVIRONMENT: Lives with: lives with their spouse or sister (when husband away for work) Lives in: [place] (with husband), condo (with sister) Stairs: Yes: External: 1 steps; none (at her home) Has following equipment at home: Single point cane, Environmental Consultant - 4 wheeled, shower chair, and Grab bars  OCCUPATION: Retired  PLOF: Independent with household mobility with device, Independent with community mobility with device, Needs assistance with homemaking, and Leisure: ***  scrabble, 4-5 days/week to gym until ~1 month ago   PATIENT GOALS: ***   OBJECTIVE: (objective measures completed at initial evaluation unless otherwise dated)  DIAGNOSTIC FINDINGS:  ***10/08/2023 - LEFT SHOULDER - 2+ VIEW FINDINGS: Degenerative changes of the acromioclavicular joint are seen. No acute fracture or dislocation is noted. No soft tissue abnormality is seen.   IMPRESSION: Mild degenerative change without acute abnormality.  PATIENT SURVEYS:  Quick Dash:  QUICK DASH  Please rate your ability do the following activities in the last week by selecting the number below the appropriate response. Activities 12/23/2023 ***  Open a tight or new jar.  {Q-dash (1-6):32984}  Do heavy household chores (e.g., wash walls, floors). {Q-dash DEVELOPMENT WORKER, COMMUNITY  Carry a shopping bag or briefcase {Q-dash [facility] your back. {Q-dash (1-6):32984}  Use a knife to cut food. {Q-dash (1-6):32984}  Recreational activities in which you take some force or impact through your arm, shoulder or hand (e.g., golf, hammering, tennis, etc.). {Q-dash (1-6):32984}  During the past week, to what extent has your arm, shoulder or hand  problem interfered with your normal social activities with family, friends, neighbors or groups?  {Q-Dash 7:32985}  During the past week, were you limited in your work or other regular daily activities as a result of your arm, shoulder or hand problem? {Q-dash 8:32988}  Rate the severity of the following symptoms in the last week: Arm, Shoulder, or hand pain. {Q-dash 0-89:67013}  Rate the severity of the following symptoms in the last week: Tingling (pins and needles) in your arm, shoulder or hand. {Q-dash 0-89:67013}  During the past week, how much difficulty have you had sleeping because of the pain in your arm, shoulder or hand?  {Q-dash 11:32987}  Disability/symptom score:    (A QuickDASH score may not be calculated if there is greater than 1 missing item.)  Quick Dash Disability/Symptom Score: [(sum of (n) responses/(n)] x 25 = score  Minimally Clinically Important Difference (MCID): 15-20 points  (Franchignoni, F. et al. (2013). Minimally clinically important difference of the disabilities of the arm, shoulder, and hand outcome measures (DASH) and its shortened version (Quick DASH). Journal of Orthopaedic & Sports Physical Therapy, 44(1), 30-39)   COGNITION: Overall cognitive status: Within functional limits for tasks assessed     SENSATION: {sensation:27233}  POSTURE: {posture:25561}  UPPER EXTREMITY ROM:   {AROM/PROM:27142} ROM Right eval Left eval  Shoulder flexion    Shoulder extension    Shoulder abduction    Shoulder adduction    Shoulder internal rotation    Shoulder external rotation    Elbow flexion    Elbow extension    Wrist flexion    Wrist extension    Wrist ulnar deviation    Wrist radial deviation    Wrist pronation    Wrist supination    (Blank rows = not tested)  UPPER EXTREMITY MMT:  MMT Right eval Left eval  Shoulder flexion    Shoulder extension    Shoulder abduction    Shoulder adduction    Shoulder internal rotation    Shoulder external  rotation    Middle trapezius    Lower trapezius    Elbow flexion    Elbow extension    Wrist flexion    Wrist extension    Wrist ulnar deviation    Wrist radial deviation    Wrist pronation  Wrist supination    Grip strength (lbs)    (Blank rows = not tested)  SHOULDER SPECIAL TESTS: Impingement tests: {shoulder impingement test:25231:a} SLAP lesions: {SLAP lesions:25232} Instability tests: {shoulder instability test:25233} Rotator cuff assessment: {rotator cuff assessment:25234} Biceps assessment: {biceps assessment:25235}  JOINT MOBILITY TESTING:  ***  PALPATION:  ***    TODAY'S TREATMENT:   ***12/23/2023 - Eval SELF CARE:  Reviewed eval findings and role of PT in addressing identified deficits as well as instruction in initial HEP (see below).    PATIENT EDUCATION:  Education details: {Education details:27468}  Person educated: {Person educated:25204} Education method: {Education Method [employer] Education comprehension: {Education Comprehension:25206}  HOME EXERCISE PROGRAM: ***   ASSESSMENT:  CLINICAL IMPRESSION: [name] [name] is a 73 y.o. female who was referred to physical therapy for evaluation and treatment for acute R shoulder pain attributed to bicipital tendinitis. ***   Patient reports onset of *** pain beginning ***. Pain is worse with ***.  Patient has deficits in *** ROM, *** flexibility, *** strength, ***abnormal posture, and TTP with abnormal muscle tension *** which are interfering with ADLs and are impacting quality of life.  On QuickDASH patient scored ***/100 demonstrating ***% disability.  [name] [name] will benefit from skilled PT to address above deficits to improve mobility and activity tolerance with decreased pain interference.  OBJECTIVE IMPAIRMENTS: {opptimpairments:25111}.   ACTIVITY LIMITATIONS: {activitylimitations:27494}  PARTICIPATION LIMITATIONS: {participationrestrictions:25113}  PERSONAL FACTORS: {Personal factors:25162} are  also affecting patient's functional outcome.   REHAB POTENTIAL: {rehabpotential:25112}  CLINICAL DECISION MAKING: {clinical decision making:25114}  EVALUATION COMPLEXITY: {Evaluation complexity:25115}   GOALS: Goals reviewed with patient? {yes/no:20286}  SHORT TERM GOALS: Target date: ***  Patient will be independent with initial HEP to improve outcomes and carryover.  Baseline: *** Goal status: {GOALSTATUS:25110}  2.  Patient will report 25% improvement in R shoulder pain to improve QOL.   Baseline: *** Goal status: {GOALSTATUS:25110}  3.  *** Baseline: *** Goal status: {GOALSTATUS:25110}  LONG TERM GOALS: Target date: ***  Patient will be independent with ongoing/advanced HEP for self-management at home.  Baseline: *** Goal status: {GOALSTATUS:25110}  2.  Patient will report 50-75% improvement in R shoulder pain to improve QOL.  Baseline: *** Goal status: {GOALSTATUS:25110}  3.  Patient to demonstrate improved upright posture with posterior shoulder girdle engaged to promote improved glenohumeral joint mobility. Baseline: *** Goal status: {GOALSTATUS:25110}  4.  Patient to improve R shoulder AROM to {Functional status:27472} without pain provocation to allow for increased ease of ADLs.  Baseline: Refer to above UE ROM table Goal status: {GOALSTATUS:25110}  5.  Patient will demonstrate improved *** strength to >/= ***/5 for functional UE use. Baseline: Refer to above UE MMT table Goal status: {GOALSTATUS:25110}  6  Patient will report </= ***% on QuickDASH (MCID = 14%) to demonstrate improved functional ability.  Baseline: *** Goal status: {GOALSTATUS:25110}  7.  Patient will ***   Baseline: *** Goal status: {GOALSTATUS:25110}   8. *** Baseline: *** Goal status: {GOALSTATUS:25110}   PLAN:  PT FREQUENCY: {rehab frequency:25116}  PT DURATION: {rehab duration:25117}  PLANNED INTERVENTIONS: {rehab planned interventions:25118::97110-Therapeutic  exercises,97530- Therapeutic [phone]- Neuromuscular re-education,97535- Self Rjmz,02859- Manual therapy,Patient/Family education}  PLAN FOR NEXT SESSION: ***   [name] [name], PT 12/23/2023, 7:03 PM

## 2023-12-24 ENCOUNTER — Ambulatory Visit: Attending: Internal Medicine | Admitting: Rehabilitation

## 2023-12-24 ENCOUNTER — Encounter: Payer: Self-pay | Admitting: Rehabilitation

## 2023-12-24 ENCOUNTER — Other Ambulatory Visit: Payer: Self-pay

## 2023-12-24 DIAGNOSIS — R293 Abnormal posture: Secondary | ICD-10-CM | POA: Insufficient documentation

## 2023-12-24 DIAGNOSIS — M6281 Muscle weakness (generalized): Secondary | ICD-10-CM | POA: Insufficient documentation

## 2023-12-24 DIAGNOSIS — M25511 Pain in right shoulder: Secondary | ICD-10-CM | POA: Diagnosis present

## 2023-12-24 NOTE — Therapy (Addendum)
 OUTPATIENT PHYSICAL THERAPY SHOULDER EVALUATION   Patient Name: [name] [name] MRN: [id number] DOB:[birth year], 73 y.o., female Today's Date: 12/24/2023   END OF SESSION:  PT End of Session - 12/24/23 1351     Visit Number 1    Date for Recertification  02/18/24    PT Start Time 1145    PT Stop Time 1230    PT Time Calculation (min) 45 min          Past Medical History:  Diagnosis Date   Bursitis    Cellulitis    arm   Common peroneal neuropathy, left 04/27/2019   Headache    Hyperlipidemia    Insomnia    OA (osteoarthritis)    Onychomycosis    Osteoarthritis    Parkinson disease (HCC) 03/23/2015   RLS (restless legs syndrome) 03/23/2015   Tenosynovitis    Past Surgical History:  Procedure Laterality Date   TUBAL LIGATION     Patient Active Problem List   Diagnosis Date Noted   Common peroneal neuropathy, left 04/27/2019   Upper airway cough syndrome 05/03/2015   RLS (restless legs syndrome) 03/23/2015   Parkinson disease (HCC) 03/23/2015   Fever 08/29/2011   Headache 08/29/2011   Knee pain, right 08/29/2011   Back pain 08/29/2011   Bacteremia due to Gram-positive bacteria 08/29/2011   Hepatitis 08/29/2011   Periorbital edema 08/29/2011   Facial rash 08/29/2011   Blurred vision, right eye 08/29/2011   Tremor 08/29/2011   Meningitis 08/29/2011   CHEST PAIN UNSPECIFIED 03/04/2007   PNEUMONIA, LEFT LOWER LOBE 03/03/2007   COUGH, CHRONIC 03/03/2007    PCP: Toribio Jerel MATSU, MD   REFERRING PROVIDER: Trudy Vaughn FALCON, MD   REFERRING DIAG: M25.511 (ICD-10-CM) - Pain in right shoulder M75.21 (ICD-10-CM) - Bicipital tendinitis, right shoulder  THERAPY DIAG:  Acute pain of right shoulder  Muscle weakness (generalized)  Abnormal posture  RATIONALE FOR EVALUATION AND TREATMENT: Rehabilitation  ONSET DATE: 2 months ago  NEXT MD VISIT:    SUBJECTIVE:                                                                                                                                                                                                          SUBJECTIVE STATEMENT: 73 y/o patient referred to PT from PCP for R shoulder pain, bicipital tendonitis.  States pain started about 2 months ago for no apparent reason and actually started in her L shoulder and then was in L and R shoulders.   She denies any falls or trauma or lifting/moving things  around the house.   Initially her pain was constant and even at night, but is better now.  States the L shoulder was much worse than the R shoulder, but L shoulder pain has completely resolved.   She just finished steroid treatment for the shoulder pain in the last 2-3 weeks.   States her night pain is mostly gone now in the R shoulder.  However, she still gets some pain with lifting heavy objects, reaching behind back or when she is doing her hair.  She states the pain is over the anterolateral R shoulder but intermittent.  Of note She has a h/o neck pain and imaging demonstrates R foraminal narrowing at C5/6 and C7/T1.     PAIN: Are you having pain? Yes: NPRS scale: 0/10 now;  2/10 worst over the last week Pain location: R shoulder Pain description: sore/tenderness at times Aggravating factors: lifting, reaching behind back, reaching overhead Relieving factors: avoiding offending movements  PERTINENT HISTORY:  Parkinson's dz, OA  PRECAUTIONS: Fall  RED FLAGS: None  HAND DOMINANCE: Right  WEIGHT BEARING RESTRICTIONS: No  FALLS:  Has patient fallen in last 6 months? 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   FINDINGS: Degenerative changes of the acromioclavicular joint are seen. No acute fracture or dislocation is noted. No soft tissue abnormality is seen.   IMPRESSION: Mild degenerative change without acute abnormality.     Electronically Signed   By: Oneil Devonshire M.D.   On: 10/08/2023 23:12PROCEDURE: LUMBAR PUNCTURE FOR CERVICAL AND LUMBAR MYELOGRAM   CERVICAL AND LUMBAR MYELOGRAM   CT CERVICAL MYELOGRAM   CT LUMBAR MYELOGRAM   Lumbar puncture and intrathecal contrast administration were performed by Dr Gillie who will separately report for the portion of the procedure. I personally supervised acquisition of the myelogram images.   TECHNIQUE: Contiguous axial images were obtained through the Cervical and Lumbar spine after the intrathecal infusion of infusion. Coronal and sagittal reconstructions were obtained of the axial image sets.   FINDINGS: CERVICAL AND LUMBAR MYELOGRAM FINDINGS:   Free flow of intrathecal contrast with no evident filling defects.   Loaded lumbar images were not required. Flexion and extension cervical views show no dynamic impingement or listhesis.   CT CERVICAL MYELOGRAM FINDINGS:   Alignment: Straightening of the cervical spine in the sagittal plane with mild levocurvature.   Osseous: Negative for fracture, erosion, or bone lesion.   Extra-spinal: No incidental mass or visible perispinal inflammation.   Cord: Normal shape and morphology. No abnormal intrathecal filling defect.   Disc levels:   C2-3: Disc narrowing and ventral spurring.  No impingement   C3-4: Spondylosis with disc narrowing and shallow central  protrusion that contacts but does not compress the cord. Patent foramina   C4-5: Spondylosis.  No neural  compression   C5-6: Disc narrowing and bulky ventral spurring. Uncovertebral spurring on the right more than left with high-grade right foraminal impingement. Patent spinal canal   C6-7: Disc narrowing and ventral spurring.  No neural impingement   C7-T1:Prominent facet osteoarthritis asymmetric on the right with spurring causing right foraminal impingement that is at least moderate. Patent spinal canal.   CT LUMBAR MYELOGRAM FINDINGS:   Segmentation: 5 lumbar type vertebrae based on the lowest ribs (which are hypoplastic).   Alignment: Mild levoscoliosis.  Mild L2-3 and L3-4 retrolisthesis.   Vertebrae: Hemangioma appearance in the T11 vertebral body. No evidence of fracture or aggressive bone lesion. No erosions.   Conus: Tip terminates at T12-L1. No abnormal intrathecal filling defect.   Extra-spinal: Atheromatous calcification.   Disc levels:   T10-11 and T11-12: Disc narrowing with bulging. No cord impingement.   T12- L1: Disc narrowing. Degenerative facet spurring on both sides. No neural compression   L1-L2: Disc narrowing with gas containing fissure. Disc bulging and buttressing osteophytes. Negative facets. No neural compression   L2-L3: Disc collapse with bulging and endplate ridging. Mild facet spurring. Mild bilateral foraminal narrowing. Patent spinal canal   L3-L4: Disc narrowing with right eccentric bulging and bulky buttressing osteophyte. Degenerative facet spurring on the right. Moderate to advanced right foraminal stenosis. Right subarticular recess narrowing which crowds the right L4 nerve root.   L4-L5: Disc collapse with bulky endplate spurring. Degenerative facet spurring on both sides. Biforaminal impingement, advanced on the left.   L5-S1:Disc narrowing and bulging with a left foraminal protrusion and buttressing osteophytes  obliterating the left foramen fat. Degenerative facet spurring on both sides.   IMPRESSION: Cervical spine:   1. Generalized degenerative disease most notable at C5-6 and C7-T1 where there is right foraminal impingement. 2. Diffusely patent spinal canal.   Lumbar spine:   1. Generalized advanced degenerative disease with mild scoliosis. 2. Advanced foraminal impingement on the left at L4-5 and L5-S1, especially at L5-S1. 3. Moderate to advanced foraminal impingement on the right at L3-4 and L4-5.     Electronically Signed   By: Cassondra Roulette M.D.   On: 02/16/2020 09:38  PATIENT SURVEYS:  Quick Dash:  QUICK DASH  Please rate your ability do the following activities in the last week by selecting the number below the appropriate response.   Activities Rating  Open a tight or new jar.  3 = Moderate difficulty  Do heavy household chores (e.g., wash walls, floors). 5 = Unable  Carry a shopping bag or briefcase 2 = Mild difficulty  Wash your back. 5 = Unable  Use a knife to cut food. 3 = Moderate difficulty  Recreational activities in which you take some force or impact through your arm, shoulder or hand (e.g., golf, hammering, tennis, etc.). 3 = Moderate difficulty  During the past week, to what extent has your arm, shoulder or hand problem interfered with your normal social activities with family, friends, neighbors or groups?  3 = Moderately  During the past week, were you limited in your work or other regular daily activities as a result of your arm, shoulder or hand problem? 1 = Not limited at all  Rate the severity of the following symptoms in the last week: Arm, Shoulder, or hand pain. 2 = Mild  Rate the severity of the following symptoms in the last week: Tingling (pins and needles) in your arm, shoulder or hand. 1 = none  During the past week, how much difficulty have  you had sleeping because of the pain in your arm, shoulder or hand?  3 = Moderate difficulty   (A  QuickDASH score may not be calculated if there is greater than 1 missing item.)  Quick Dash Disability/Symptom Score: [(sum of 31 (n) responses/11 (n)] x 25 = 45.45  Minimally Clinically Important Difference (MCID): 15-20 points  (Franchignoni, F. et al. (2013). Minimally clinically important difference of the disabilities of the arm, shoulder, and hand outcome measures (DASH) and its shortened version (Quick DASH). Journal of Orthopaedic & Sports Physical Therapy, 44(1), 30-39)   COGNITION: Overall cognitive status: Within functional limits for tasks assessed     SENSATION: WFL  POSTURE: rounded shoulders and forward head  CERVICAL ROM:  cervical ROM does not  cause or reproduce any of her shoulder pain  Active ROM A/PROM (deg) eval  Flexion 80%  Extension 75%  Right lateral flexion 50%  Left lateral flexion 65%  Right rotation 90%  Left rotation 75%   (Blank rows = not tested)   UPPER EXTREMITY ROM:   Active ROM Right eval Left eval  Shoulder flexion 180+ 180+  Shoulder extension 70 70  Shoulder abduction 180 180  Shoulder adduction Can reach well past her opposite shoulder nt  Shoulder internal rotation To T9 functional and 90+ at 90 ABD T9  Shoulder external rotation 85 at neutral;  112 deg at 90 ABD   Elbow flexion    Elbow extension    Wrist flexion    Wrist extension    Wrist ulnar deviation    Wrist radial deviation    Wrist pronation    Wrist supination    (Blank rows = not tested)  UPPER EXTREMITY MMT:  MMT Right eval Left eval  Shoulder flexion 4 5  Shoulder extension    Shoulder abduction 4+ 4+  Shoulder adduction 5 5  Shoulder internal rotation 5 5  Shoulder external rotation 4 5  Middle trapezius 4- 4+  Lower trapezius    Elbow flexion 5;  can do curls with 7 lb DB painfree 5  Elbow extension 5 at neutral;  4- and painful at 90 deg shoulder ABD   Wrist flexion    Wrist extension    Wrist ulnar deviation    Wrist radial deviation    Wrist  pronation    Wrist supination    Grip strength (lbs)    (Blank rows = not tested)  SHOULDER SPECIAL TESTS: Impingement tests: Neer impingement test: negative and Hawkins/Kennedy impingement test: negative SLAP lesions: NT Instability tests: Apprehension test: positive  Rotator cuff assessment: Drop arm test: negative, Empty can test: positive , and Gerber lift off test: negative but weak Biceps assessment: Yergason's test: negative and Speed's test: negative  JOINT MOBILITY TESTING:  Hypermobile R shoulder joint mobility  PALPATION:  TTP over the anterior R shoulder at subacromial space, coracoid, bicipital groove, and greater tuberosity    TODAY'S TREATMENT:  12/24/23 SELF CARE: Provided education on PT POC progression; initial HEP   PATIENT EDUCATION:  Education details: PT eval findings, anticipated POC, and initial HEP  Person educated: Patient Education method: Explanation, Demonstration, Verbal cues, Tactile cues, Handouts, and MedBridge[employer] app access provided Education comprehension: verbalized understanding, verbal cues required, tactile cues required, and needs further education  HOME EXERCISE PROGRAM: Access Code: [email or web address] URL: [email or web address] Date: 12/24/2023 Prepared by: Garnette Montclair  Exercises - Supine Shoulder Circles with Weight  - 1 x daily - 7 x weekly - 3 sets - 10 reps -  Sidelying Shoulder External Rotation with Dumbbell  - 1 x daily - 7 x weekly - 3 sets - 10 reps - Sidelying Shoulder Horizontal Abduction  - 1 x daily - 7 x weekly - 3 sets - 10 reps - Seated Shoulder External Rotation in Abduction Supported with Dumbbell  - 1 x daily - 7 x weekly - 3 sets - 10 reps - Single Arm Bent Over Shoulder Horizontal Abduction with Dumbbell - Thumb Up  - 1 x daily - 7 x weekly - 3 sets - 10 reps - Standing Shoulder Row with Anchored Resistance  - 1 x daily - 7 x weekly - 3 sets - 10 reps   ASSESSMENT:  CLINICAL IMPRESSION: [name] [name] is a 73 y.o. female who was referred to physical therapy for evaluation and treatment for R shoulder pain and bicipital tendonitis.   She just finished prednisone  recently per her report and it really helped.   In fact she doesn't have any pain with resisted elbow flexion, speed's test, or Yergason's.   However, she does have painful empty can, full can, and weak infraspinatus tests.    Patient reports onset of B shoulder pain beginning a couple of months ago for no reason.   L shoulder was actually much worse than the R but L shoulder pain has fully resolved.  R shoulder Pain is worse with lifting, reaching behind back or behind head for ADL.   Patient does seem to have considerable hypermobility of the R shoulder.  RTC and scapular stabilizers are weak.   Patient has deficits in  R shoulder strength, somewhat forward head abnormal posture, and pain around the anterolateral shoulder which are interfering with ADLs and are impacting quality of life.  On QuickDASH patient scored  45.45% disability.  [name] [name] from skilled PT to address above deficits to improve mobility and activity tolerance with decreased pain interference.   OBJECTIVE IMPAIRMENTS: impaired UE functional use, postural dysfunction, and pain.   ACTIVITY LIMITATIONS: carrying, lifting, reach over head, and hygiene/grooming  PARTICIPATION LIMITATIONS: cleaning, laundry, and community activity  PERSONAL FACTORS: Age, Time since onset of injury/illness/exacerbation, and 1-2 comorbidities: Parkinson's dz, OAL common peroneal neuropathy (2021), headache, OA, RLS, h/o back pain are also affecting patient's functional outcome.   REHAB POTENTIAL: Good  CLINICAL DECISION MAKING: Evolving/moderate complexity  EVALUATION COMPLEXITY: Moderate   GOALS: Goals reviewed with patient? Yes  SHORT TERM GOALS: Target date: 01/21/2024   Patient will be independent with initial HEP to improve outcomes and carryover.  Baseline: 100%  PT assist required for correct completion Goal status: INITIAL  2.  Patient will report 25% improvement in R shoulder pain to improve QOL.   Baseline: 2/10 Goal status: INITIAL  3.  Patient will demonstrate 4 to 4+/5 strength in the R shoulder musculature Baseline: see MMT tables above Goal status: INITIAL  LONG TERM GOALS: Target date: 02/18/2024   Patient will be independent with ongoing/advanced HEP for self-management at home.  Baseline: no advanced HEP yet Goal status: INITIAL  2.  Patient will report 50-75% improvement in R shoulder pain to improve QOL.  Baseline: 2/10 Goal status: IN PROGRESS  3.  Patient to demonstrate improved upright posture with posterior shoulder girdle engaged to promote improved glenohumeral joint mobility. Baseline: forward head Goal status: INITIAL   5.  Patient will demonstrate improved R shoulder strength to >/= 5/5 for functional UE use. Baseline: Refer to above UE MMT table Goal status: INITIAL  6  Patient will report </= 30% on QuickDASH (MCID = 14%) to demonstrate improved functional ability.  Baseline: 45.45 Goal status: INITIAL   PLAN:  PT FREQUENCY: 1-2x/week  PT DURATION: 8 weeks  PLANNED INTERVENTIONS: 97164- PT Re-evaluation, 97110-Therapeutic exercises, 97530- Therapeutic activity, [id number]- Neuromuscular re-education, 97535- Self Care, 02859- Manual therapy, [id number]- Electrical stimulation (unattended), 97016- Vasopneumatic device, [id number]- Ultrasound, [id number]- Traction (mechanical), [id number]- Ionotophoresis 4mg /ml Dexamethasone , Patient/Family education, Cryotherapy, and Moist heat  PLAN FOR NEXT SESSION: See how HEP did.  Progress with RTC and scapular strengthening as she tolerates, progress HEP;  KT tape PRN   Tynisha Ogan, PT 12/24/2023, 1:53 PM   Date of referral: see referral Referring provider: Trudy Vaughn FALCON, MD Referring diagnosis? M25.511 (ICD-10-CM) - Pain in right shoulder M75.21 (ICD-10-CM) - Bicipital  tendinitis, right shoulder Treatment diagnosis? (if different than referring diagnosis)  Acute pain of right shoulder - Plan: PT plan of care cert/re-cert  Muscle weakness (generalized) - Plan: PT plan of care cert/re-cert  Abnormal posture - Plan: PT plan of care cert/re-cert  What was this (referring dx) caused by? Other: insidious onset  Nature of Condition: Initial Onset (within last 3 months)   Laterality: Rt  Current Functional Measure Score: DASH 45%  Objective measurements identify impairments when they are compared to normal values, the uninvolved extremity, and prior level of function.  [x]  Yes  []  No  Objective assessment of functional ability: Moderate functional limitations   Briefly describe symptoms: 73 y/o patient referred to PT from PCP for R shoulder pain, bicipital tendonitis.  States pain started about 2 months ago for no apparent reason and actually started in her L shoulder and then was in L and R shoulders.   She denies any falls or trauma or lifting/moving things around the house.   Initially her pain was constant and even at night, but is better now.  States the L shoulder was much worse than the R shoulder, but L shoulder pain has completely resolved.   She just finished steroid treatment for the shoulder pain in the last 2-3 weeks.   States her night pain is mostly gone now in the R shoulder.  However, she still gets some pain with lifting heavy objects, reaching behind back or when she is doing her hair.  She states the pain is over the anterolateral R shoulder but intermittent.  Of note She has a h/o neck pain and imaging demonstrates R foraminal narrowing at C5/6 and C7/T1.   How did symptoms start: as above in note  Average pain intensity:  Last 24 hours: 1/10  Past week: 2/10  How often does the pt experience symptoms? Frequently  How much have the symptoms interfered with usual daily activities? Moderately  How has condition changed since care began  at this facility? Better  In general, how is the patients overall health? Good  Onset date: 2 months ago   BACK PAIN (STarT Back Screening Tool) - (When applicable):  NO BACK PAIN  Has your back pain spread down your leg(s) at sometime in the last 2 weeks? []  Yes   []  No Have you had pain in the shoulder or neck at sometime in the past 2 weeks? []  Yes   []  No Have you only walked short distances because of your back pain? []  Yes   []  No In the past 2 weeks, have you dressed more slowly than usual because of your back pain? []  Yes   []  No Do you think it is not  really safe for person with a condition like yours to be physically active? []  Yes   []  No Have worrying thoughts been going through your mind a lot of the time? []  Yes   []  No Do you feel that your back pain is terrible and it is never going to get any better? []  Yes   []  No In general, have you stopped enjoying all the things you usually enjoy? []  Yes   []  No Overall, how bothersome has your back pain been in the last 2 weeks? []  Not at all   []  Slightly     []  Moderate   []  Very much     []  Extremely

## 2024-01-07 ENCOUNTER — Ambulatory Visit: Admitting: Rehabilitation

## 2024-01-14 ENCOUNTER — Encounter: Payer: Self-pay | Admitting: Rehabilitation

## 2024-01-14 ENCOUNTER — Ambulatory Visit: Attending: Internal Medicine | Admitting: Rehabilitation

## 2024-01-14 DIAGNOSIS — M25511 Pain in right shoulder: Secondary | ICD-10-CM | POA: Diagnosis present

## 2024-01-14 DIAGNOSIS — M6281 Muscle weakness (generalized): Secondary | ICD-10-CM | POA: Insufficient documentation

## 2024-01-14 DIAGNOSIS — R2689 Other abnormalities of gait and mobility: Secondary | ICD-10-CM | POA: Diagnosis present

## 2024-01-14 DIAGNOSIS — R293 Abnormal posture: Secondary | ICD-10-CM | POA: Diagnosis present

## 2024-01-14 DIAGNOSIS — R2681 Unsteadiness on feet: Secondary | ICD-10-CM | POA: Diagnosis present

## 2024-01-14 NOTE — Therapy (Signed)
 OUTPATIENT PHYSICAL THERAPY SHOULDER TREATMENT   Patient Name: [name] [name] MRN: [id number] DOB:February 06, 1951, 73 y.o., female Today's Date: 01/14/2024   END OF SESSION:  PT End of Session - 01/14/24 1150     Visit Number 2    Date for Recertification  02/18/24    PT Start Time 1148    PT Stop Time 1230    PT Time Calculation (min) 42 min    Activity Tolerance Patient tolerated treatment well;No increased pain          Past Medical History:  Diagnosis Date   Bursitis    Cellulitis    arm   Common peroneal neuropathy, left 04/27/2019   Headache    Hyperlipidemia    Insomnia    OA (osteoarthritis)    Onychomycosis    Osteoarthritis    Parkinson disease (HCC) 03/23/2015   RLS (restless legs syndrome) 03/23/2015   Tenosynovitis    Past Surgical History:  Procedure Laterality Date   TUBAL LIGATION     Patient Active Problem List   Diagnosis Date Noted   Common peroneal neuropathy, left 04/27/2019   Upper airway cough syndrome 05/03/2015   RLS (restless legs syndrome) 03/23/2015   Parkinson disease (HCC) 03/23/2015   Fever 08/29/2011   Headache 08/29/2011   Knee pain, right 08/29/2011   Back pain 08/29/2011   Bacteremia due to Gram-positive bacteria 08/29/2011   Hepatitis 08/29/2011   Periorbital edema 08/29/2011   Facial rash 08/29/2011   Blurred vision, right eye 08/29/2011   Tremor 08/29/2011   Meningitis 08/29/2011   CHEST PAIN UNSPECIFIED 03/04/2007   PNEUMONIA, LEFT LOWER LOBE 03/03/2007   COUGH, CHRONIC 03/03/2007    PCP: Toribio Jerel MATSU, MD   REFERRING PROVIDER: Trudy Vaughn FALCON, MD   REFERRING DIAG: M25.511 (ICD-10-CM) - Pain in right shoulder M75.21 (ICD-10-CM) - Bicipital tendinitis, right shoulder  THERAPY DIAG:  Acute pain of right shoulder  Muscle weakness (generalized)  Abnormal posture  RATIONALE FOR EVALUATION AND TREATMENT: Rehabilitation  ONSET DATE: 2 months ago  NEXT MD VISIT:    SUBJECTIVE:                                                                                                                                                                                                          SUBJECTIVE STATEMENT: Patient has laryngitis today and can't really talk audibly, but she is able to communicate that she has been doing her HEP and her R shoulder pain is 0/10 today.   No c/o of the L shoulder either  EVAL:  73 y/o patient referred to PT from PCP for R shoulder pain, bicipital tendonitis.  States pain started about 2 months ago for no apparent reason and actually started in her L shoulder and then was in L and R shoulders.   She denies any falls or trauma or lifting/moving things around the house.   Initially her pain was constant and even at night, but is better now.  States the L shoulder was much worse than the R shoulder, but L shoulder pain has completely resolved.   She just finished steroid treatment for the shoulder pain in the last 2-3 weeks.   States her night pain is mostly gone now in the R shoulder.  However, she still gets some pain with lifting heavy objects, reaching behind back or when she is doing her hair.  She states the pain is over the anterolateral R shoulder but intermittent.  Of note She has a h/o neck pain and imaging demonstrates R foraminal narrowing at C5/6 and C7/T1.     PAIN: Are you having pain? Yes: NPRS scale: 0/10 now;  2/10 worst over the last week Pain location: R shoulder Pain description: sore/tenderness at times Aggravating factors: lifting, reaching behind back, reaching overhead Relieving factors: avoiding offending movements  PERTINENT HISTORY:  Parkinson's dz, OA  PRECAUTIONS: Fall  RED FLAGS: None  HAND DOMINANCE: Right  WEIGHT BEARING RESTRICTIONS: No  FALLS:  Has patient fallen in last 6 months? 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   FINDINGS: Degenerative changes of the acromioclavicular joint are seen. No acute fracture or dislocation is noted. No soft tissue abnormality is seen.   IMPRESSION: Mild degenerative change without acute abnormality.     Electronically Signed   By: Oneil Devonshire M.D.   On: 10/08/2023 23:12PROCEDURE: LUMBAR PUNCTURE FOR CERVICAL AND LUMBAR MYELOGRAM   CERVICAL AND LUMBAR MYELOGRAM   CT CERVICAL MYELOGRAM   CT LUMBAR MYELOGRAM   Lumbar puncture and intrathecal contrast administration were performed by Dr Gillie who will separately report for the portion of the procedure. I personally supervised acquisition of the myelogram images.   TECHNIQUE: Contiguous axial images were obtained through the Cervical and Lumbar spine after the intrathecal infusion of infusion. Coronal and sagittal reconstructions were obtained of the axial image sets.   FINDINGS: CERVICAL AND LUMBAR MYELOGRAM FINDINGS:   Free flow of intrathecal contrast with no evident filling defects.   Loaded lumbar images were not required. Flexion and extension cervical views show no dynamic impingement or listhesis.   CT CERVICAL MYELOGRAM FINDINGS:   Alignment: Straightening of the cervical spine in the sagittal plane with mild levocurvature.   Osseous: Negative for fracture, erosion, or bone lesion.   Extra-spinal: No  incidental mass or visible perispinal inflammation.   Cord:  Normal shape and morphology. No abnormal intrathecal filling defect.   Disc levels:   C2-3: Disc narrowing and ventral spurring.  No impingement   C3-4: Spondylosis with disc narrowing and shallow central protrusion that contacts but does not compress the cord. Patent foramina   C4-5: Spondylosis.  No neural compression   C5-6: Disc narrowing and bulky ventral spurring. Uncovertebral spurring on the right more than left with high-grade right foraminal impingement. Patent spinal canal   C6-7: Disc narrowing and ventral spurring.  No neural impingement   C7-T1:Prominent facet osteoarthritis asymmetric on the right with spurring causing right foraminal impingement that is at least moderate. Patent spinal canal.   CT LUMBAR MYELOGRAM FINDINGS:   Segmentation: 5 lumbar type vertebrae based on the lowest ribs (which are hypoplastic).   Alignment: Mild levoscoliosis.  Mild L2-3 and L3-4 retrolisthesis.   Vertebrae: Hemangioma appearance in the T11 vertebral body. No evidence of fracture or aggressive bone lesion. No erosions.   Conus: Tip terminates at T12-L1. No abnormal intrathecal filling defect.   Extra-spinal: Atheromatous calcification.   Disc levels:   T10-11 and T11-12: Disc narrowing with bulging. No cord impingement.   T12- L1: Disc narrowing. Degenerative facet spurring on both sides. No neural compression   L1-L2: Disc narrowing with gas containing fissure. Disc bulging and buttressing osteophytes. Negative facets. No neural compression   L2-L3: Disc collapse with bulging and endplate ridging. Mild facet spurring. Mild bilateral foraminal narrowing. Patent spinal canal   L3-L4: Disc narrowing with right eccentric bulging and bulky buttressing osteophyte. Degenerative facet spurring on the right. Moderate to advanced right foraminal stenosis. Right subarticular recess narrowing which crowds the  right L4 nerve root.   L4-L5: Disc collapse with bulky endplate spurring. Degenerative facet spurring on both sides. Biforaminal impingement, advanced on the left.   L5-S1:Disc narrowing and bulging with a left foraminal protrusion and buttressing osteophytes obliterating the left foramen fat. Degenerative facet spurring on both sides.   IMPRESSION: Cervical spine:   1. Generalized degenerative disease most notable at C5-6 and C7-T1 where there is right foraminal impingement. 2. Diffusely patent spinal canal.   Lumbar spine:   1. Generalized advanced degenerative disease with mild scoliosis. 2. Advanced foraminal impingement on the left at L4-5 and L5-S1, especially at L5-S1. 3. Moderate to advanced foraminal impingement on the right at L3-4 and L4-5.     Electronically Signed   By: Cassondra Roulette M.D.   On: 02/16/2020 09:38  PATIENT SURVEYS:  Quick Dash:  QUICK DASH  Please rate your ability do the following activities in the last week by selecting the number below the appropriate response.   Activities Rating  Open a tight or new jar.  3 = Moderate difficulty  Do heavy household chores (e.g., wash walls, floors). 5 = Unable  Carry a shopping bag or briefcase 2 = Mild difficulty  Wash your back. 5 = Unable  Use a knife to cut food. 3 = Moderate difficulty  Recreational activities in which you take some force or impact through your arm, shoulder or hand (e.g., golf, hammering, tennis, etc.). 3 = Moderate difficulty  During the past week, to what extent has your arm, shoulder or hand problem interfered with your normal social activities with family, friends, neighbors or groups?  3 = Moderately  During the past week, were you limited in your work or other regular daily activities as a result of your arm, shoulder or hand problem? 1 = Not limited at all  Rate  the severity of the following symptoms in the last week: Arm, Shoulder, or hand pain. 2 = Mild  Rate the severity  of the following symptoms in the last week: Tingling (pins and needles) in your arm, shoulder or hand. 1 = none  During the past week, how much difficulty have you had sleeping because of the pain in your arm, shoulder or hand?  3 = Moderate difficulty   (A QuickDASH score may not be calculated if there is greater than 1 missing item.)  Quick Dash Disability/Symptom Score: [(sum of 31 (n) responses/11 (n)] x 25 = 45.45  Minimally Clinically Important Difference (MCID): 15-20 points  (Franchignoni, F. et al. (2013). Minimally clinically important difference of the disabilities of the arm, shoulder, and hand outcome measures (DASH) and its shortened version (Quick DASH). Journal of Orthopaedic & Sports Physical Therapy, 44(1), 30-39)   COGNITION: Overall cognitive status: Within functional limits for tasks assessed     SENSATION: WFL  POSTURE: rounded shoulders and forward head  CERVICAL ROM:  cervical ROM does not  cause or reproduce any of her shoulder pain  Active ROM A/PROM (deg) eval  Flexion 80%  Extension 75%  Right lateral flexion 50%  Left lateral flexion 65%  Right rotation 90%  Left rotation 75%   (Blank rows = not tested)   UPPER EXTREMITY ROM:   Active ROM Right eval Left eval  Shoulder flexion 180+ 180+  Shoulder extension 70 70  Shoulder abduction 180 180  Shoulder adduction Can reach well past her opposite shoulder nt  Shoulder internal rotation To T9 functional and 90+ at 90 ABD T9  Shoulder external rotation 85 at neutral;  112 deg at 90 ABD   Elbow flexion    Elbow extension    Wrist flexion    Wrist extension    Wrist ulnar deviation    Wrist radial deviation    Wrist pronation    Wrist supination    (Blank rows = not tested)  UPPER EXTREMITY MMT:  MMT Right eval Left eval  Shoulder flexion 4 5  Shoulder extension    Shoulder abduction 4+ 4+  Shoulder adduction 5 5  Shoulder internal rotation 5 5  Shoulder external rotation 4 5  Middle  trapezius 4- 4+  Lower trapezius    Elbow flexion 5;  can do curls with 7 lb DB painfree 5  Elbow extension 5 at neutral;  4- and painful at 90 deg shoulder ABD   Wrist flexion    Wrist extension    Wrist ulnar deviation    Wrist radial deviation    Wrist pronation    Wrist supination    Grip strength (lbs)    (Blank rows = not tested)  SHOULDER SPECIAL TESTS: Impingement tests: Neer impingement test: negative and Hawkins/Kennedy impingement test: negative SLAP lesions: NT Instability tests: Apprehension test: positive  Rotator cuff assessment: Drop arm test: negative, Empty can test: positive , and Gerber lift off test: negative but weak Biceps assessment: Yergason's test: negative and Speed's test: negative  JOINT MOBILITY TESTING:  Hypermobile R shoulder joint mobility  PALPATION:  TTP over the anterior R shoulder at subacromial space, coracoid, bicipital groove, and greater tuberosity    TODAY'S TREATMENT:  01/14/24 THERAPEUTIC EXERCISE: To improve strength and ROM.  Demonstration, verbal and tactile cues throughout for technique. UBE L0 x 2'F/2'B  THERAPEUTIC ACTIVITIES: To improve functional performance.  Demonstration, verbal and tactile cues throughout for technique. SL ER 2# x 20 RUE SL HABD  0#  x 20 RUE Supine circles at 90 x 20 CW; x 20 CCW---CCW is a little painful so she is advised not to do this one at home w/ HEP Supine 90/90 shoulder ER 1# x 20 RUE Standing Lat PD 5# x 2/10 BUE Seated Lat PD 15# x 2/10 BUE Seated single arm Lat PD 5# x 2/10 RUE Bent over HABD x 20 RUE Bent over flexion/LT raise x 20 RUE  NEUROMUSCULAR RE-EDUCATION: To improve coordination, kinesthesia, posture, and proprioception. Supine shoulder ABD slide up table by PT w/ gentle adductive force by patient to teach shoulder depression w/ ABD Supine shoulder ABD w/ adductive isometrics at multiple angles of ABD RUE  12/24/23 SELF CARE: Provided education on PT POC progression; initial  HEP   PATIENT EDUCATION:  Education details: HEP review and HEP update  Person educated: Patient Education method: Explanation, Demonstration, Verbal cues, Tactile cues, Handouts, and MedBridgeGO app access provided Education comprehension: verbalized understanding, verbal cues required, tactile cues required, and needs further education  HOME EXERCISE PROGRAM: Access Code: [email or web address] URL: [email or web address] Date: 01/14/2024 Prepared by: Garnette Montclair  Exercises - Supine Shoulder Circles with Weight  - 1 x daily - 7 x weekly - 3 sets - 10 reps - Sidelying Shoulder External Rotation with Dumbbell  - 1 x daily - 7 x weekly - 3 sets - 10 reps - Sidelying Shoulder Horizontal Abduction  - 1 x daily - 7 x weekly - 3 sets - 10 reps - Seated Shoulder External Rotation in Abduction Supported with Dumbbell  - 1 x daily - 7 x weekly - 3 sets - 10 reps - Single Arm Bent Over Shoulder Horizontal Abduction with Dumbbell - Thumb Up  - 1 x daily - 7 x weekly - 3 sets - 10 reps - Standing Shoulder Row with Anchored Resistance  - 1 x daily - 7 x weekly - 3 sets - 10 reps - Single Arm Bent Over Shoulder Scaption with Dumbbell  - 1 x daily - 7 x weekly - 3 sets - 10 reps - Bent Over Single Arm Shoulder Row with Dumbbell  - 1 x daily - 7 x weekly - 3 sets - 10 reps - Shoulder extension with resistance - Neutral  - 1 x daily - 7 x weekly - 3 sets - 10 reps   ASSESSMENT:  CLINICAL IMPRESSION: Patient is able to recall her initial HEP well and able to advance with RTC/scapular strengthening today.  She is making good progress but still getting some pain with certain movements of the R shoulder.  There are some differences in the quality of movement of the R shoulder in comparison to the L.   There seems to be some tone present in the anterior R shoulder from the pecs when she is doing pulldowns today causing an anterior GH translatory movement.   This certainly could be a cause of some of  her pain.  Worked on this in supine with some neuro reeducation techniques and she was able to abduct the arm painfree.   Will continue to work on posterior strengthening of the R shoulder with neuro reed.   Continue per poc  EVAL: [name] [name] is a 73 y.o. female who was referred to physical therapy for evaluation and treatment for R shoulder pain and bicipital tendonitis.   She just finished prednisone  recently per her report and it really helped.   In fact she doesn't have any pain with resisted elbow flexion, speed's test,  or Yergason's.   However, she does have painful empty can, full can, and weak infraspinatus tests.    Patient reports onset of B shoulder pain beginning a couple of months ago for no reason.   L shoulder was actually much worse than the R but L shoulder pain has fully resolved.  R shoulder Pain is worse with lifting, reaching behind back or behind head for ADL.   Patient does seem to have considerable hypermobility of the R shoulder.  RTC and scapular stabilizers are weak.   Patient has deficits in  R shoulder strength, somewhat forward head abnormal posture, and pain around the anterolateral shoulder which are interfering with ADLs and are impacting quality of life.  On QuickDASH patient scored  45.45% disability.  [name] [name] will benefit from skilled PT to address above deficits to improve mobility and activity tolerance with decreased pain interference.   OBJECTIVE IMPAIRMENTS: impaired UE functional use, postural dysfunction, and pain.   ACTIVITY LIMITATIONS: carrying, lifting, reach over head, and hygiene/grooming  PARTICIPATION LIMITATIONS: cleaning, laundry, and community activity  PERSONAL FACTORS: Age, Time since onset of injury/illness/exacerbation, and 1-2 comorbidities: Parkinson's dz, OAL common peroneal neuropathy (2021), headache, OA, RLS, h/o back pain are also affecting patient's functional outcome.   REHAB POTENTIAL: Good  CLINICAL DECISION MAKING:  Evolving/moderate complexity  EVALUATION COMPLEXITY: Moderate   GOALS: Goals reviewed with patient? Yes  SHORT TERM GOALS: Target date: 01/21/2024   Patient will be independent with initial HEP to improve outcomes and carryover.  Baseline: 100% PT assist required for correct completion Goal status: MET--01/14/24  2.  Patient will report 25% improvement in R shoulder pain to improve QOL.   Baseline: 2/10 Goal status: MET--01/14/24 no pain  3.  Patient will demonstrate 4 to 4+/5 strength in the R shoulder musculature Baseline: see MMT tables above Goal status: INITIAL  LONG TERM GOALS: Target date: 02/18/2024   Patient will be independent with ongoing/advanced HEP for self-management at home.  Baseline: no advanced HEP yet 01/14/24:  advancing Goal status: IN PROGRESS  2.  Patient will report 50-75% improvement in R shoulder pain to improve QOL.  Baseline: 2/10 Goal status: IN PROGRESS  3.  Patient to demonstrate improved upright posture with posterior shoulder girdle engaged to promote improved glenohumeral joint mobility. Baseline: forward head Goal status: INITIAL   5.  Patient will demonstrate improved R shoulder strength to >/= 5/5 for functional UE use. Baseline: Refer to above UE MMT table Goal status: INITIAL  6  Patient will report </= 30% on QuickDASH (MCID = 14%) to demonstrate improved functional ability.  Baseline: 45.45 Goal status: INITIAL   PLAN:  PT FREQUENCY: 1-2x/week  PT DURATION: 8 weeks  PLANNED INTERVENTIONS: 97164- PT Re-evaluation, 97110-Therapeutic exercises, 97530- Therapeutic activity, [id number]- Neuromuscular re-education, 97535- Self Care, [id number]- Manual therapy, [id number]- Electrical stimulation (unattended), 97016- Vasopneumatic device, [id number]- Ultrasound, 02987- Traction (mechanical), [id number]- Ionotophoresis 4mg /ml Dexamethasone , Patient/Family education, Cryotherapy, and Moist heat  PLAN FOR NEXT SESSION: Try scapular clocks on the table,  scapular depression and retraction movements in WB positions leaning on the table or counter;  snow angels or wall angels w/ ball behind elbow   [name] [name], PT 01/14/2024, 9:26 PM   Date of referral: see referral Referring provider: Trudy Vaughn FALCON, MD Referring diagnosis? M25.511 (ICD-10-CM) - Pain in right shoulder M75.21 (ICD-10-CM) - Bicipital tendinitis, right shoulder Treatment diagnosis? (if different than referring diagnosis)  Acute pain of right shoulder  Muscle weakness (generalized)  Abnormal posture  What was this (  referring dx) caused by? Other: insidious onset  Nature of Condition: Initial Onset (within last 3 months)   Laterality: Rt  Current Functional Measure Score: DASH 45%  Objective measurements identify impairments when they are compared to normal values, the uninvolved extremity, and prior level of function.  [x]  Yes  []  No  Objective assessment of functional ability: Moderate functional limitations   Briefly describe symptoms: 73 y/o patient referred to PT from PCP for R shoulder pain, bicipital tendonitis.  States pain started about 2 months ago for no apparent reason and actually started in her L shoulder and then was in L and R shoulders.   She denies any falls or trauma or lifting/moving things around the house.   Initially her pain was constant and even at night, but is better now.  States the L shoulder was much worse than the R shoulder, but L shoulder pain has completely resolved.   She just finished steroid treatment for the shoulder pain in the last 2-3 weeks.   States her night pain is mostly gone now in the R shoulder.  However, she still gets some pain with lifting heavy objects, reaching behind back or when she is doing her hair.  She states the pain is over the anterolateral R shoulder but intermittent.  Of note She has a h/o neck pain and imaging demonstrates R foraminal narrowing at C5/6 and C7/T1.   How did symptoms start: as above  in note  Average pain intensity:  Last 24 hours: 1/10  Past week: 2/10  How often does the pt experience symptoms? Frequently  How much have the symptoms interfered with usual daily activities? Moderately  How has condition changed since care began at this facility? Better  In general, how is the patients overall health? Good  Onset date: 2 months ago   BACK PAIN (STarT Back Screening Tool) - (When applicable):  NO BACK PAIN  Has your back pain spread down your leg(s) at sometime in the last 2 weeks? []  Yes   []  No Have you had pain in the shoulder or neck at sometime in the past 2 weeks? []  Yes   []  No Have you only walked short distances because of your back pain? []  Yes   []  No In the past 2 weeks, have you dressed more slowly than usual because of your back pain? []  Yes   []  No Do you think it is not really safe for person with a condition like yours to be physically active? []  Yes   []  No Have worrying thoughts been going through your mind a lot of the time? []  Yes   []  No Do you feel that your back pain is terrible and it is never going to get any better? []  Yes   []  No In general, have you stopped enjoying all the things you usually enjoy? []  Yes   []  No Overall, how bothersome has your back pain been in the last 2 weeks? []  Not at all   []  Slightly     []  Moderate   []  Very much     []  Extremely

## 2024-01-20 ENCOUNTER — Ambulatory Visit

## 2024-01-20 DIAGNOSIS — R2681 Unsteadiness on feet: Secondary | ICD-10-CM

## 2024-01-20 DIAGNOSIS — R2689 Other abnormalities of gait and mobility: Secondary | ICD-10-CM

## 2024-01-20 DIAGNOSIS — R293 Abnormal posture: Secondary | ICD-10-CM

## 2024-01-20 DIAGNOSIS — M6281 Muscle weakness (generalized): Secondary | ICD-10-CM

## 2024-01-20 DIAGNOSIS — M25511 Pain in right shoulder: Secondary | ICD-10-CM | POA: Diagnosis not present

## 2024-01-20 NOTE — Therapy (Signed)
 OUTPATIENT PHYSICAL THERAPY SHOULDER TREATMENT   Patient Name: [name] [name] MRN: [id number] DOB:[birth year], 73 y.o., female Today's Date: 01/20/2024   END OF SESSION:  PT End of Session - 01/20/24 1322     Visit Number 3    Date for Recertification  02/18/24    PT Start Time 1315    PT Stop Time 1402    PT Time Calculation (min) 47 min    Activity Tolerance Patient tolerated treatment well;No increased pain           Past Medical History:  Diagnosis Date   Bursitis    Cellulitis    arm   Common peroneal neuropathy, left 04/27/2019   Headache    Hyperlipidemia    Insomnia    OA (osteoarthritis)    Onychomycosis    Osteoarthritis    Parkinson disease (HCC) 03/23/2015   RLS (restless legs syndrome) 03/23/2015   Tenosynovitis    Past Surgical History:  Procedure Laterality Date   TUBAL LIGATION     Patient Active Problem List   Diagnosis Date Noted   Common peroneal neuropathy, left 04/27/2019   Upper airway cough syndrome 05/03/2015   RLS (restless legs syndrome) 03/23/2015   Parkinson disease (HCC) 03/23/2015   Fever 08/29/2011   Headache 08/29/2011   Knee pain, right 08/29/2011   Back pain 08/29/2011   Bacteremia due to Gram-positive bacteria 08/29/2011   Hepatitis 08/29/2011   Periorbital edema 08/29/2011   Facial rash 08/29/2011   Blurred vision, right eye 08/29/2011   Tremor 08/29/2011   Meningitis 08/29/2011   CHEST PAIN UNSPECIFIED 03/04/2007   PNEUMONIA, LEFT LOWER LOBE 03/03/2007   COUGH, CHRONIC 03/03/2007    PCP: Toribio Jerel MATSU, MD   REFERRING PROVIDER: Trudy Vaughn FALCON, MD   REFERRING DIAG: M25.511 (ICD-10-CM) - Pain in right shoulder M75.21 (ICD-10-CM) - Bicipital tendinitis, right shoulder  THERAPY DIAG:  Acute pain of right shoulder  Muscle weakness (generalized)  Abnormal posture  Other abnormalities of gait and mobility  Unsteadiness on feet  RATIONALE FOR EVALUATION AND TREATMENT: Rehabilitation  ONSET DATE: 2 months  ago  NEXT MD VISIT:    SUBJECTIVE:                                                                                                                                                                                                         SUBJECTIVE STATEMENT: Pt reports last night R shoulder hurt 10/10   EVAL:  73 y/o patient referred to PT from PCP for R shoulder pain, bicipital tendonitis.  States pain  started about 2 months ago for no apparent reason and actually started in her L shoulder and then was in L and R shoulders.   She denies any falls or trauma or lifting/moving things around the house.   Initially her pain was constant and even at night, but is better now.  States the L shoulder was much worse than the R shoulder, but L shoulder pain has completely resolved.   She just finished steroid treatment for the shoulder pain in the last 2-3 weeks.   States her night pain is mostly gone now in the R shoulder.  However, she still gets some pain with lifting heavy objects, reaching behind back or when she is doing her hair.  She states the pain is over the anterolateral R shoulder but intermittent.  Of note She has a h/o neck pain and imaging demonstrates R foraminal narrowing at C5/6 and C7/T1.     PAIN: Are you having pain? Yes: NPRS scale: 0/10 now;  2/10 worst over the last week Pain location: R shoulder Pain description: sore/tenderness at times Aggravating factors: lifting, reaching behind back, reaching overhead Relieving factors: avoiding offending movements  PERTINENT HISTORY:  Parkinson's dz, OA  PRECAUTIONS: Fall  RED FLAGS: None  HAND DOMINANCE: Right  WEIGHT BEARING RESTRICTIONS: No  FALLS:  Has patient fallen in last 6 months? 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   FINDINGS: Degenerative changes of the acromioclavicular joint are seen. No acute fracture or dislocation is noted. No soft tissue abnormality is seen.   IMPRESSION: Mild degenerative change without acute abnormality.     Electronically Signed   By: Oneil Devonshire M.D.   On: 10/08/2023 23:12PROCEDURE: LUMBAR PUNCTURE FOR CERVICAL AND LUMBAR MYELOGRAM   CERVICAL AND LUMBAR MYELOGRAM   CT CERVICAL MYELOGRAM   CT LUMBAR MYELOGRAM   Lumbar puncture and intrathecal contrast administration were performed by Dr Gillie who will separately report for the portion of the procedure. I personally supervised acquisition of the myelogram images.   TECHNIQUE: Contiguous axial images were obtained through the Cervical and Lumbar spine after the intrathecal infusion of infusion. Coronal and sagittal reconstructions were obtained of the axial image sets.   FINDINGS: CERVICAL AND LUMBAR MYELOGRAM FINDINGS:   Free flow of intrathecal contrast with no evident filling defects.   Loaded lumbar images were not required. Flexion and extension cervical views show no dynamic impingement or listhesis.   CT CERVICAL MYELOGRAM FINDINGS:   Alignment: Straightening of the cervical spine in the sagittal plane with mild levocurvature.   Osseous: Negative for fracture, erosion, or bone lesion.   Extra-spinal: No incidental mass or visible perispinal inflammation.   Cord: Normal shape and  morphology. No abnormal intrathecal filling defect.   Disc levels:   C2-3: Disc  narrowing and ventral spurring.  No impingement   C3-4: Spondylosis with disc narrowing and shallow central protrusion that contacts but does not compress the cord. Patent foramina   C4-5: Spondylosis.  No neural compression   C5-6: Disc narrowing and bulky ventral spurring. Uncovertebral spurring on the right more than left with high-grade right foraminal impingement. Patent spinal canal   C6-7: Disc narrowing and ventral spurring.  No neural impingement   C7-T1:Prominent facet osteoarthritis asymmetric on the right with spurring causing right foraminal impingement that is at least moderate. Patent spinal canal.   CT LUMBAR MYELOGRAM FINDINGS:   Segmentation: 5 lumbar type vertebrae based on the lowest ribs (which are hypoplastic).   Alignment: Mild levoscoliosis.  Mild L2-3 and L3-4 retrolisthesis.   Vertebrae: Hemangioma appearance in the T11 vertebral body. No evidence of fracture or aggressive bone lesion. No erosions.   Conus: Tip terminates at T12-L1. No abnormal intrathecal filling defect.   Extra-spinal: Atheromatous calcification.   Disc levels:   T10-11 and T11-12: Disc narrowing with bulging. No cord impingement.   T12- L1: Disc narrowing. Degenerative facet spurring on both sides. No neural compression   L1-L2: Disc narrowing with gas containing fissure. Disc bulging and buttressing osteophytes. Negative facets. No neural compression   L2-L3: Disc collapse with bulging and endplate ridging. Mild facet spurring. Mild bilateral foraminal narrowing. Patent spinal canal   L3-L4: Disc narrowing with right eccentric bulging and bulky buttressing osteophyte. Degenerative facet spurring on the right. Moderate to advanced right foraminal stenosis. Right subarticular recess narrowing which crowds the right L4 nerve root.   L4-L5: Disc collapse with bulky endplate spurring.  Degenerative facet spurring on both sides. Biforaminal impingement, advanced on the left.   L5-S1:Disc narrowing and bulging with a left foraminal protrusion and buttressing osteophytes obliterating the left foramen fat. Degenerative facet spurring on both sides.   IMPRESSION: Cervical spine:   1. Generalized degenerative disease most notable at C5-6 and C7-T1 where there is right foraminal impingement. 2. Diffusely patent spinal canal.   Lumbar spine:   1. Generalized advanced degenerative disease with mild scoliosis. 2. Advanced foraminal impingement on the left at L4-5 and L5-S1, especially at L5-S1. 3. Moderate to advanced foraminal impingement on the right at L3-4 and L4-5.     Electronically Signed   By: Cassondra Roulette M.D.   On: 02/16/2020 09:38  PATIENT SURVEYS:  Quick Dash:  QUICK DASH  Please rate your ability do the following activities in the last week by selecting the number below the appropriate response.   Activities Rating  Open a tight or new jar.  3 = Moderate difficulty  Do heavy household chores (e.g., wash walls, floors). 5 = Unable  Carry a shopping bag or briefcase 2 = Mild difficulty  Wash your back. 5 = Unable  Use a knife to cut food. 3 = Moderate difficulty  Recreational activities in which you take some force or impact through your arm, shoulder or hand (e.g., golf, hammering, tennis, etc.). 3 = Moderate difficulty  During the past week, to what extent has your arm, shoulder or hand problem interfered with your normal social activities with family, friends, neighbors or groups?  3 = Moderately  During the past week, were you limited in your work or other regular daily activities as a result of your arm, shoulder or hand problem? 1 = Not limited at all  Rate the severity of the following symptoms in the last week: Arm, Shoulder, or hand pain. 2 =  Mild  Rate the severity of the following symptoms in the last week: Tingling (pins and needles) in  your arm, shoulder or hand. 1 = none  During the past week, how much difficulty have you had sleeping because of the pain in your arm, shoulder or hand?  3 = Moderate difficulty   (A QuickDASH score may not be calculated if there is greater than 1 missing item.)  Quick Dash Disability/Symptom Score: [(sum of 31 (n) responses/11 (n)] x 25 = 45.45  Minimally Clinically Important Difference (MCID): 15-20 points  (Franchignoni, F. et al. (2013). Minimally clinically important difference of the disabilities of the arm, shoulder, and hand outcome measures (DASH) and its shortened version (Quick DASH). Journal of Orthopaedic & Sports Physical Therapy, 44(1), 30-39)   COGNITION: Overall cognitive status: Within functional limits for tasks assessed     SENSATION: WFL  POSTURE: rounded shoulders and forward head  CERVICAL ROM:  cervical ROM does not  cause or reproduce any of her shoulder pain  Active ROM A/PROM (deg) eval  Flexion 80%  Extension 75%  Right lateral flexion 50%  Left lateral flexion 65%  Right rotation 90%  Left rotation 75%   (Blank rows = not tested)   UPPER EXTREMITY ROM:   Active ROM Right eval Left eval  Shoulder flexion 180+ 180+  Shoulder extension 70 70  Shoulder abduction 180 180  Shoulder adduction Can reach well past her opposite shoulder nt  Shoulder internal rotation To T9 functional and 90+ at 90 ABD T9  Shoulder external rotation 85 at neutral;  112 deg at 90 ABD   Elbow flexion    Elbow extension    Wrist flexion    Wrist extension    Wrist ulnar deviation    Wrist radial deviation    Wrist pronation    Wrist supination    (Blank rows = not tested)  UPPER EXTREMITY MMT:  MMT Right eval Left eval  Shoulder flexion 4 5  Shoulder extension    Shoulder abduction 4+ 4+  Shoulder adduction 5 5  Shoulder internal rotation 5 5  Shoulder external rotation 4 5  Middle trapezius 4- 4+  Lower trapezius    Elbow flexion 5;  can do curls with 7  lb DB painfree 5  Elbow extension 5 at neutral;  4- and painful at 90 deg shoulder ABD   Wrist flexion    Wrist extension    Wrist ulnar deviation    Wrist radial deviation    Wrist pronation    Wrist supination    Grip strength (lbs)    (Blank rows = not tested)  SHOULDER SPECIAL TESTS: Impingement tests: Neer impingement test: negative and Hawkins/Kennedy impingement test: negative SLAP lesions: NT Instability tests: Apprehension test: positive  Rotator cuff assessment: Drop arm test: negative, Empty can test: positive , and Gerber lift off test: negative but weak Biceps assessment: Yergason's test: negative and Speed's test: negative  JOINT MOBILITY TESTING:  Hypermobile R shoulder joint mobility  PALPATION:  TTP over the anterior R shoulder at subacromial space, coracoid, bicipital groove, and greater tuberosity    TODAY'S TREATMENT:  01/20/24 THERAPEUTIC EXERCISE: To improve strength and ROM.  Demonstration, verbal and tactile cues throughout for technique. Nustep L5x38min UE/LE Seated flexion slide with orange pball x 20; scaption x 20 NEUROMUSCULAR RE-EDUCATION: To improve coordination, kinesthesia, posture, and proprioception.  Bent over shoulder extension x 20 Bent over HABD x 20 Standing rows RTB x 20 Standing shoulder extension x 15  GTB Supine scapular retraction 10x3' Supine shoulder circles with scap retraction and depression Seated scapular retraction 01/14/24 THERAPEUTIC EXERCISE: To improve strength and ROM.  Demonstration, verbal and tactile cues throughout for technique. UBE L0 x 2'F/2'B  THERAPEUTIC ACTIVITIES: To improve functional performance.  Demonstration, verbal and tactile cues throughout for technique. SL ER 2# x 20 RUE SL HABD 0#  x 20 RUE Supine circles at 90 x 20 CW; x 20 CCW---CCW is a little painful so she is advised not to do this one at home w/ HEP Supine 90/90 shoulder ER 1# x 20 RUE Standing Lat PD 5# x 2/10 BUE Seated Lat PD 15# x  2/10 BUE Seated single arm Lat PD 5# x 2/10 RUE Bent over HABD x 20 RUE Bent over flexion/LT raise x 20 RUE  NEUROMUSCULAR RE-EDUCATION: To improve coordination, kinesthesia, posture, and proprioception. Supine shoulder ABD slide up table by PT w/ gentle adductive force by patient to teach shoulder depression w/ ABD Supine shoulder ABD w/ adductive isometrics at multiple angles of ABD RUE  12/24/23 SELF CARE: Provided education on PT POC progression; initial HEP   PATIENT EDUCATION:  Education details: HEP review and HEP update  Person educated: Patient Education method: Explanation, Demonstration, Verbal cues, Tactile cues, Handouts, and MedBridgeGO app access provided Education comprehension: verbalized understanding, verbal cues required, tactile cues required, and needs further education  HOME EXERCISE PROGRAM: Access Code: [email or web address] URL: [email or web address] Date: 01/14/2024 Prepared by: Garnette Montclair  Exercises - Supine Shoulder Circles with Weight  - 1 x daily - 7 x weekly - 3 sets - 10 reps - Sidelying Shoulder External Rotation with Dumbbell  - 1 x daily - 7 x weekly - 3 sets - 10 reps - Sidelying Shoulder Horizontal Abduction  - 1 x daily - 7 x weekly - 3 sets - 10 reps - Seated Shoulder External Rotation in Abduction Supported with Dumbbell  - 1 x daily - 7 x weekly - 3 sets - 10 reps - Single Arm Bent Over Shoulder Horizontal Abduction with Dumbbell - Thumb Up  - 1 x daily - 7 x weekly - 3 sets - 10 reps - Standing Shoulder Row with Anchored Resistance  - 1 x daily - 7 x weekly - 3 sets - 10 reps - Single Arm Bent Over Shoulder Scaption with Dumbbell  - 1 x daily - 7 x weekly - 3 sets - 10 reps - Bent Over Single Arm Shoulder Row with Dumbbell  - 1 x daily - 7 x weekly - 3 sets - 10 reps - Shoulder extension with resistance - Neutral  - 1 x daily - 7 x weekly - 3 sets - 10 reps   ASSESSMENT:  CLINICAL IMPRESSION: Continued progressing NMR for  posture and scapular stabilization. Instruction provided for scap retraction and depress through the day to improve posture (R humeral head translated anteriorly). She continues to benefit from scapular retraction exercises and NMR to optimize function  EVAL: Nashly P Lanese is a 73 y.o. female who was referred to physical therapy for evaluation and treatment for R shoulder pain and bicipital tendonitis.   She just finished prednisone  recently per her report and it really helped.   In fact she doesn't have any pain with resisted elbow flexion, speed's test, or Yergason's.   However, she does have painful empty can, full can, and weak infraspinatus tests.    Patient reports onset of B shoulder pain beginning a couple of months ago for no reason.  L shoulder was actually much worse than the R but L shoulder pain has fully resolved.  R shoulder Pain is worse with lifting, reaching behind back or behind head for ADL.   Patient does seem to have considerable hypermobility of the R shoulder.  RTC and scapular stabilizers are weak.   Patient has deficits in  R shoulder strength, somewhat forward head abnormal posture, and pain around the anterolateral shoulder which are interfering with ADLs and are impacting quality of life.  On QuickDASH patient scored  45.45% disability.  Livia will benefit from skilled PT to address above deficits to improve mobility and activity tolerance with decreased pain interference.   OBJECTIVE IMPAIRMENTS: impaired UE functional use, postural dysfunction, and pain.   ACTIVITY LIMITATIONS: carrying, lifting, reach over head, and hygiene/grooming  PARTICIPATION LIMITATIONS: cleaning, laundry, and community activity  PERSONAL FACTORS: Age, Time since onset of injury/illness/exacerbation, and 1-2 comorbidities: Parkinson's dz, OAL common peroneal neuropathy (2021), headache, OA, RLS, h/o back pain are also affecting patient's functional outcome.   REHAB POTENTIAL: Good  CLINICAL  DECISION MAKING: Evolving/moderate complexity  EVALUATION COMPLEXITY: Moderate   GOALS: Goals reviewed with patient? Yes  SHORT TERM GOALS: Target date: 01/21/2024   Patient will be independent with initial HEP to improve outcomes and carryover.  Baseline: 100% PT assist required for correct completion Goal status: MET--01/14/24  2.  Patient will report 25% improvement in R shoulder pain to improve QOL.   Baseline: 2/10 Goal status: MET--01/14/24 no pain  3.  Patient will demonstrate 4 to 4+/5 strength in the R shoulder musculature Baseline: see MMT tables above Goal status: INITIAL  LONG TERM GOALS: Target date: 02/18/2024   Patient will be independent with ongoing/advanced HEP for self-management at home.  Baseline: no advanced HEP yet 01/14/24:  advancing Goal status: IN PROGRESS  2.  Patient will report 50-75% improvement in R shoulder pain to improve QOL.  Baseline: 2/10 Goal status: IN PROGRESS  3.  Patient to demonstrate improved upright posture with posterior shoulder girdle engaged to promote improved glenohumeral joint mobility. Baseline: forward head Goal status: INITIAL   5.  Patient will demonstrate improved R shoulder strength to >/= 5/5 for functional UE use. Baseline: Refer to above UE MMT table Goal status: INITIAL  6  Patient will report </= 30% on QuickDASH (MCID = 14%) to demonstrate improved functional ability.  Baseline: 45.45 Goal status: INITIAL   PLAN:  PT FREQUENCY: 1-2x/week  PT DURATION: 8 weeks  PLANNED INTERVENTIONS: 02835- PT Re-evaluation, 97110-Therapeutic exercises, 97530- Therapeutic activity, 97112- Neuromuscular re-education, 97535- Self Care, 02859- Manual therapy, [id number]- Electrical stimulation (unattended), 97016- Vasopneumatic device, [id number]- Ultrasound, 02987- Traction (mechanical), [id number]- Ionotophoresis 4mg /ml Dexamethasone , Patient/Family education, Cryotherapy, and Moist heat  PLAN FOR NEXT SESSION: resubmit for [facility]  medicare visits; Try scapular clocks on the table, scapular depression and retraction movements in WB positions leaning on the table or counter;  snow angels or wall angels w/ ball behind elbow   Sol LITTIE Gaskins, PTA 01/20/2024, 2:23 PM   Date of referral: see referral Referring provider: Trudy Vaughn FALCON, MD Referring diagnosis? M25.511 (ICD-10-CM) - Pain in right shoulder M75.21 (ICD-10-CM) - Bicipital tendinitis, right shoulder Treatment diagnosis? (if different than referring diagnosis)  Acute pain of right shoulder  Muscle weakness (generalized)  Abnormal posture  Other abnormalities of gait and mobility  Unsteadiness on feet  What was this (referring dx) caused by? Other: insidious onset  Nature of Condition: Initial Onset (within last 3 months)   Laterality:  Rt  Current Functional Measure Score: DASH 45%  Objective measurements identify impairments when they are compared to normal values, the uninvolved extremity, and prior level of function.  [x]  Yes  []  No  Objective assessment of functional ability: Moderate functional limitations   Briefly describe symptoms: 73 y/o patient referred to PT from PCP for R shoulder pain, bicipital tendonitis.  States pain started about 2 months ago for no apparent reason and actually started in her L shoulder and then was in L and R shoulders.   She denies any falls or trauma or lifting/moving things around the house.   Initially her pain was constant and even at night, but is better now.  States the L shoulder was much worse than the R shoulder, but L shoulder pain has completely resolved.   She just finished steroid treatment for the shoulder pain in the last 2-3 weeks.   States her night pain is mostly gone now in the R shoulder.  However, she still gets some pain with lifting heavy objects, reaching behind back or when she is doing her hair.  She states the pain is over the anterolateral R shoulder but intermittent.  Of note She has a  h/o neck pain and imaging demonstrates R foraminal narrowing at C5/6 and C7/T1.   How did symptoms start: as above in note  Average pain intensity:  Last 24 hours: 1/10  Past week: 2/10  How often does the pt experience symptoms? Frequently  How much have the symptoms interfered with usual daily activities? Moderately  How has condition changed since care began at this facility? Better  In general, how is the patients overall health? Good  Onset date: 2 months ago   BACK PAIN (STarT Back Screening Tool) - (When applicable):  NO BACK PAIN  Has your back pain spread down your leg(s) at sometime in the last 2 weeks? []  Yes   []  No Have you had pain in the shoulder or neck at sometime in the past 2 weeks? []  Yes   []  No Have you only walked short distances because of your back pain? []  Yes   []  No In the past 2 weeks, have you dressed more slowly than usual because of your back pain? []  Yes   []  No Do you think it is not really safe for person with a condition like yours to be physically active? []  Yes   []  No Have worrying thoughts been going through your mind a lot of the time? []  Yes   []  No Do you feel that your back pain is terrible and it is never going to get any better? []  Yes   []  No In general, have you stopped enjoying all the things you usually enjoy? []  Yes   []  No Overall, how bothersome has your back pain been in the last 2 weeks? []  Not at all   []  Slightly     []  Moderate   []  Very much     []  Extremely

## 2024-01-28 ENCOUNTER — Encounter: Payer: Self-pay | Admitting: Rehabilitation

## 2024-01-28 ENCOUNTER — Ambulatory Visit: Admitting: Rehabilitation

## 2024-01-28 DIAGNOSIS — R293 Abnormal posture: Secondary | ICD-10-CM | POA: Insufficient documentation

## 2024-01-28 DIAGNOSIS — M6281 Muscle weakness (generalized): Secondary | ICD-10-CM | POA: Diagnosis present

## 2024-01-28 DIAGNOSIS — M25511 Pain in right shoulder: Secondary | ICD-10-CM | POA: Insufficient documentation

## 2024-01-28 NOTE — Therapy (Signed)
 OUTPATIENT PHYSICAL THERAPY SHOULDER TREATMENT   Patient Name: [name] [name] MRN: [id number] DOB:1950/04/02, 73 y.o., female Today's Date: 01/28/2024   END OF SESSION:  PT End of Session - 01/28/24 1316     Visit Number 4    Date for Recertification  02/18/24    PT Start Time 1155    PT Stop Time 1237    PT Time Calculation (min) 42 min    Activity Tolerance Patient tolerated treatment well;No increased pain            Past Medical History:  Diagnosis Date   Bursitis    Cellulitis    arm   Common peroneal neuropathy, left 04/27/2019   Headache    Hyperlipidemia    Insomnia    OA (osteoarthritis)    Onychomycosis    Osteoarthritis    Parkinson disease (HCC) 03/23/2015   RLS (restless legs syndrome) 03/23/2015   Tenosynovitis    Past Surgical History:  Procedure Laterality Date   TUBAL LIGATION     Patient Active Problem List   Diagnosis Date Noted   Common peroneal neuropathy, left 04/27/2019   Upper airway cough syndrome 05/03/2015   RLS (restless legs syndrome) 03/23/2015   Parkinson disease (HCC) 03/23/2015   Fever 08/29/2011   Headache 08/29/2011   Knee pain, right 08/29/2011   Back pain 08/29/2011   Bacteremia due to Gram-positive bacteria 08/29/2011   Hepatitis 08/29/2011   Periorbital edema 08/29/2011   Facial rash 08/29/2011   Blurred vision, right eye 08/29/2011   Tremor 08/29/2011   Meningitis 08/29/2011   CHEST PAIN UNSPECIFIED 03/04/2007   PNEUMONIA, LEFT LOWER LOBE 03/03/2007   COUGH, CHRONIC 03/03/2007    PCP: Toribio Jerel MATSU, MD   REFERRING PROVIDER: Trudy Vaughn FALCON, MD   REFERRING DIAG: M25.511 (ICD-10-CM) - Pain in right shoulder M75.21 (ICD-10-CM) - Bicipital tendinitis, right shoulder  THERAPY DIAG:  Acute pain of right shoulder  Muscle weakness (generalized)  Abnormal posture  RATIONALE FOR EVALUATION AND TREATMENT: Rehabilitation  ONSET DATE: 2 months ago  NEXT MD VISIT:    SUBJECTIVE:                                                                                                                                                                                                          SUBJECTIVE STATEMENT: Pt reports last night R shoulder hurt 10/10   EVAL:  73 y/o patient referred to PT from PCP for R shoulder pain, bicipital tendonitis.  States pain started about 2 months ago for no apparent reason and  actually started in her L shoulder and then was in L and R shoulders.   She denies any falls or trauma or lifting/moving things around the house.   Initially her pain was constant and even at night, but is better now.  States the L shoulder was much worse than the R shoulder, but L shoulder pain has completely resolved.   She just finished steroid treatment for the shoulder pain in the last 2-3 weeks.   States her night pain is mostly gone now in the R shoulder.  However, she still gets some pain with lifting heavy objects, reaching behind back or when she is doing her hair.  She states the pain is over the anterolateral R shoulder but intermittent.  Of note She has a h/o neck pain and imaging demonstrates R foraminal narrowing at C5/6 and C7/T1.     PAIN: Are you having pain? Yes: NPRS scale: 0/10 now;  2/10 worst over the last week Pain location: R shoulder Pain description: sore/tenderness at times Aggravating factors: lifting, reaching behind back, reaching overhead Relieving factors: avoiding offending movements  PERTINENT HISTORY:  Parkinson's dz, OA  PRECAUTIONS: Fall  RED FLAGS: None  HAND DOMINANCE: Right  WEIGHT BEARING RESTRICTIONS: No  FALLS:  Has patient fallen in last 6 months? 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   FINDINGS: Degenerative changes of the acromioclavicular joint are seen. No acute fracture or dislocation is noted. No soft tissue abnormality is seen.   IMPRESSION: Mild degenerative change without acute abnormality.     Electronically Signed   By: Oneil Devonshire M.D.   On: 10/08/2023 23:12PROCEDURE: LUMBAR PUNCTURE FOR CERVICAL AND LUMBAR MYELOGRAM   CERVICAL AND LUMBAR MYELOGRAM   CT CERVICAL MYELOGRAM   CT LUMBAR MYELOGRAM   Lumbar puncture and intrathecal contrast administration were performed by Dr Gillie who will separately report for the portion of the procedure. I personally supervised acquisition of the myelogram images.   TECHNIQUE: Contiguous axial images were obtained through the Cervical and Lumbar spine after the intrathecal infusion of infusion. Coronal and sagittal reconstructions were obtained of the axial image sets.   FINDINGS: CERVICAL AND LUMBAR MYELOGRAM FINDINGS:   Free flow of intrathecal contrast with no evident filling defects.   Loaded lumbar images were not required. Flexion and extension cervical views show no dynamic impingement or listhesis.   CT CERVICAL MYELOGRAM FINDINGS:   Alignment: Straightening of the cervical spine in the sagittal plane with mild levocurvature.   Osseous: Negative for fracture, erosion, or bone lesion.   Extra-spinal: No incidental mass or visible perispinal inflammation.   Cord: Normal shape and morphology. No abnormal intrathecal filling defect.    Disc levels:   C2-3: Disc narrowing and ventral spurring.  No impingement   C3-4:  Spondylosis with disc narrowing and shallow central protrusion that contacts but does not compress the cord. Patent foramina   C4-5: Spondylosis.  No neural compression   C5-6: Disc narrowing and bulky ventral spurring. Uncovertebral spurring on the right more than left with high-grade right foraminal impingement. Patent spinal canal   C6-7: Disc narrowing and ventral spurring.  No neural impingement   C7-T1:Prominent facet osteoarthritis asymmetric on the right with spurring causing right foraminal impingement that is at least moderate. Patent spinal canal.   CT LUMBAR MYELOGRAM FINDINGS:   Segmentation: 5 lumbar type vertebrae based on the lowest ribs (which are hypoplastic).   Alignment: Mild levoscoliosis.  Mild L2-3 and L3-4 retrolisthesis.   Vertebrae: Hemangioma appearance in the T11 vertebral body. No evidence of fracture or aggressive bone lesion. No erosions.   Conus: Tip terminates at T12-L1. No abnormal intrathecal filling defect.   Extra-spinal: Atheromatous calcification.   Disc levels:   T10-11 and T11-12: Disc narrowing with bulging. No cord impingement.   T12- L1: Disc narrowing. Degenerative facet spurring on both sides. No neural compression   L1-L2: Disc narrowing with gas containing fissure. Disc bulging and buttressing osteophytes. Negative facets. No neural compression   L2-L3: Disc collapse with bulging and endplate ridging. Mild facet spurring. Mild bilateral foraminal narrowing. Patent spinal canal   L3-L4: Disc narrowing with right eccentric bulging and bulky buttressing osteophyte. Degenerative facet spurring on the right. Moderate to advanced right foraminal stenosis. Right subarticular recess narrowing which crowds the right L4 nerve root.   L4-L5: Disc collapse with bulky endplate spurring. Degenerative facet spurring on both sides. Biforaminal  impingement, advanced on the left.   L5-S1:Disc narrowing and bulging with a left foraminal protrusion and buttressing osteophytes obliterating the left foramen fat. Degenerative facet spurring on both sides.   IMPRESSION: Cervical spine:   1. Generalized degenerative disease most notable at C5-6 and C7-T1 where there is right foraminal impingement. 2. Diffusely patent spinal canal.   Lumbar spine:   1. Generalized advanced degenerative disease with mild scoliosis. 2. Advanced foraminal impingement on the left at L4-5 and L5-S1, especially at L5-S1. 3. Moderate to advanced foraminal impingement on the right at L3-4 and L4-5.     Electronically Signed   By: Cassondra Roulette M.D.   On: 02/16/2020 09:38  PATIENT SURVEYS:  Quick Dash:  QUICK DASH  Please rate your ability do the following activities in the last week by selecting the number below the appropriate response.   Activities Rating  Open a tight or new jar.  3 = Moderate difficulty  Do heavy household chores (e.g., wash walls, floors). 5 = Unable  Carry a shopping bag or briefcase 2 = Mild difficulty  Wash your back. 5 = Unable  Use a knife to cut food. 3 = Moderate difficulty  Recreational activities in which you take some force or impact through your arm, shoulder or hand (e.g., golf, hammering, tennis, etc.). 3 = Moderate difficulty  During the past week, to what extent has your arm, shoulder or hand problem interfered with your normal social activities with family, friends, neighbors or groups?  3 = Moderately  During the past week, were you limited in your work or other regular daily activities as a result of your arm, shoulder or hand problem? 1 = Not limited at all  Rate the severity of the following symptoms in the last week: Arm, Shoulder, or hand pain. 2 = Mild  Rate the severity of the following symptoms in  the last week: Tingling (pins and needles) in your arm, shoulder or hand. 1 = none  During the past  week, how much difficulty have you had sleeping because of the pain in your arm, shoulder or hand?  3 = Moderate difficulty   (A QuickDASH score may not be calculated if there is greater than 1 missing item.)  Quick Dash Disability/Symptom Score: [(sum of 31 (n) responses/11 (n)] x 25 = 45.45  Minimally Clinically Important Difference (MCID): 15-20 points  (Franchignoni, F. et al. (2013). Minimally clinically important difference of the disabilities of the arm, shoulder, and hand outcome measures (DASH) and its shortened version (Quick DASH). Journal of Orthopaedic & Sports Physical Therapy, 44(1), 30-39)   COGNITION: Overall cognitive status: Within functional limits for tasks assessed     SENSATION: WFL  POSTURE: rounded shoulders and forward head  CERVICAL ROM:  cervical ROM does not  cause or reproduce any of her shoulder pain  Active ROM A/PROM (deg) eval  Flexion 80%  Extension 75%  Right lateral flexion 50%  Left lateral flexion 65%  Right rotation 90%  Left rotation 75%   (Blank rows = not tested)   UPPER EXTREMITY ROM:   Active ROM Right eval Left eval  Shoulder flexion 180+ 180+  Shoulder extension 70 70  Shoulder abduction 180 180  Shoulder adduction Can reach well past her opposite shoulder nt  Shoulder internal rotation To T9 functional and 90+ at 90 ABD T9  Shoulder external rotation 85 at neutral;  112 deg at 90 ABD   Elbow flexion    Elbow extension    Wrist flexion    Wrist extension    Wrist ulnar deviation    Wrist radial deviation    Wrist pronation    Wrist supination    (Blank rows = not tested)  UPPER EXTREMITY MMT:  MMT Right eval Left eval  Shoulder flexion 4 5  Shoulder extension    Shoulder abduction 4+ 4+  Shoulder adduction 5 5  Shoulder internal rotation 5 5  Shoulder external rotation 4 5  Middle trapezius 4- 4+  Lower trapezius    Elbow flexion 5;  can do curls with 7 lb DB painfree 5  Elbow extension 5 at neutral;  4- and  painful at 90 deg shoulder ABD   Wrist flexion    Wrist extension    Wrist ulnar deviation    Wrist radial deviation    Wrist pronation    Wrist supination    Grip strength (lbs)    (Blank rows = not tested)  SHOULDER SPECIAL TESTS: Impingement tests: Neer impingement test: negative and Hawkins/Kennedy impingement test: negative SLAP lesions: NT Instability tests: Apprehension test: positive  Rotator cuff assessment: Drop arm test: negative, Empty can test: positive , and Gerber lift off test: negative but weak Biceps assessment: Yergason's test: negative and Speed's test: negative  JOINT MOBILITY TESTING:  Hypermobile R shoulder joint mobility  PALPATION:  TTP over the anterior R shoulder at subacromial space, coracoid, bicipital groove, and greater tuberosity    TODAY'S TREATMENT:  01/28/24 THERAPEUTIC EXERCISE: To improve strength and endurance.  Demonstration, verbal and tactile cues throughout for technique. UBE L0 x 2'F/2'B  THERAPEUTIC ACTIVITIES: To improve functional performance.  Demonstration, verbal and tactile cues throughout for technique. Bent over at counter:  Pendulums CW x 20; CCW x 20 RUE  Shoulder ext x 20 RUE  Shoulder rowing 2# x 20 RUE  High row/elbow out x 20 RUE  Doorway  shoulder extension stretch x 1' x 2 BUE  NEUROMUSCULAR RE-EDUCATION: To improve kinesthesia, posture, and proprioception. Supine bent elbow scap retraction x 2/10 BUE Supine scapular retraction w/ shoulder at 90 deg flexion Supine circles at 90 CCW x 20 w/ retraction; CW increased pain so stopped SL ER x 20 RUE w/ manual facilitation for correct performance SL HABD x 20 RUE w/ manual facilitation for correct performance  MANUAL THERAPY: To promote improved flexibility utilizing joint mobilization and kinesiotaping. Gentle grade 3-4 GH posterior glides w/ arm at 90 degrees scaption and at neutral/0 degrees x 20-30 reps Gentle inferior glides to R GH joint x 20-30 reps in standing  at doorway and RUE at 90/90 resting on PT shoulder KT tape w/ 1 circumferential I strip and a second I strip anterior to posterior no tension placed in exaggerated/corrected posture for   Postural reminders and inhibition of the pecs  01/20/24 THERAPEUTIC EXERCISE: To improve strength and ROM.  Demonstration, verbal and tactile cues throughout for technique. Nustep L5x54min UE/LE Seated flexion slide with orange pball x 20; scaption x 20 NEUROMUSCULAR RE-EDUCATION: To improve coordination, kinesthesia, posture, and proprioception.  Bent over shoulder extension x 20 Bent over HABD x 20 Standing rows RTB x 20 Standing shoulder extension x 15 GTB Supine scapular retraction 10x3' Supine shoulder circles with scap retraction and depression Seated scapular retraction 01/14/24 THERAPEUTIC EXERCISE: To improve strength and ROM.  Demonstration, verbal and tactile cues throughout for technique. UBE L0 x 2'F/2'B  THERAPEUTIC ACTIVITIES: To improve functional performance.  Demonstration, verbal and tactile cues throughout for technique. SL ER 2# x 20 RUE SL HABD 0#  x 20 RUE Supine circles at 90 x 20 CW; x 20 CCW---CCW is a little painful so she is advised not to do this one at home w/ HEP Supine 90/90 shoulder ER 1# x 20 RUE Standing Lat PD 5# x 2/10 BUE Seated Lat PD 15# x 2/10 BUE Seated single arm Lat PD 5# x 2/10 RUE Bent over HABD x 20 RUE Bent over flexion/LT raise x 20 RUE  NEUROMUSCULAR RE-EDUCATION: To improve coordination, kinesthesia, posture, and proprioception. Supine shoulder ABD slide up table by PT w/ gentle adductive force by patient to teach shoulder depression w/ ABD Supine shoulder ABD w/ adductive isometrics at multiple angles of ABD RUE  12/24/23 SELF CARE: Provided education on PT POC progression; initial HEP   PATIENT EDUCATION:  Education details: HEP review and HEP update  Person educated: Patient Education method: Explanation, Demonstration, Verbal cues,  Tactile cues, Handouts, and MedBridge[employer] app access provided Education comprehension: verbalized understanding, verbal cues required, tactile cues required, and needs further education  HOME EXERCISE PROGRAM: Access Code: [email or web address] URL: [email or web address] Date: 01/14/2024 Prepared by: Garnette Montclair  Exercises - Supine Shoulder Circles with Weight  - 1 x daily - 7 x weekly - 3 sets - 10 reps - Sidelying Shoulder External Rotation with Dumbbell  - 1 x daily - 7 x weekly - 3 sets - 10 reps - Sidelying Shoulder Horizontal Abduction  - 1 x daily - 7 x weekly - 3 sets - 10 reps - Seated Shoulder External Rotation in Abduction Supported with Dumbbell  - 1 x daily - 7 x weekly - 3 sets - 10 reps - Single Arm Bent Over Shoulder Horizontal Abduction with Dumbbell - Thumb Up  - 1 x daily - 7 x weekly - 3 sets - 10 reps - Standing Shoulder Row with Anchored Resistance  - 1 x daily -  7 x weekly - 3 sets - 10 reps - Single Arm Bent Over Shoulder Scaption with Dumbbell  - 1 x daily - 7 x weekly - 3 sets - 10 reps - Bent Over Single Arm Shoulder Row with Dumbbell  - 1 x daily - 7 x weekly - 3 sets - 10 reps - Shoulder extension with resistance - Neutral  - 1 x daily - 7 x weekly - 3 sets - 10 reps   ASSESSMENT:  CLINICAL IMPRESSION: Patient continues to demonstrate anterior L humeral head posturing.  Parkinsonian tone is likely factoring into this as well.  Her L scapula demonstrates winging with L shoulder movements as well.   Worked today on posterior/inferior humeral head glides, teaching patient to hold her shoulder in this position with movement.   However, patient has difficulty with activating her middle traps and serratus for shoulder retraction and scapular positioning.   Will continue to work on strengthening with neuro reeducation techniques and manual facilitation and guiding for proper completion with proper form of her exercises.   PT remains necessary for pain, weakness,  postural deficit.s   continue per POC  EVAL: [name] [name] is a 73 y.o. female who was referred to physical therapy for evaluation and treatment for R shoulder pain and bicipital tendonitis.   She just finished prednisone  recently per her report and it really helped.   In fact she doesn't have any pain with resisted elbow flexion, speed's test, or Yergason's.   However, she does have painful empty can, full can, and weak infraspinatus tests.    Patient reports onset of B shoulder pain beginning a couple of months ago for no reason.   L shoulder was actually much worse than the R but L shoulder pain has fully resolved.  R shoulder Pain is worse with lifting, reaching behind back or behind head for ADL.   Patient does seem to have considerable hypermobility of the R shoulder.  RTC and scapular stabilizers are weak.   Patient has deficits in  R shoulder strength, somewhat forward head abnormal posture, and pain around the anterolateral shoulder which are interfering with ADLs and are impacting quality of life.  On QuickDASH patient scored  45.45% disability.  [name] [name] will benefit from skilled PT to address above deficits to improve mobility and activity tolerance with decreased pain interference.   OBJECTIVE IMPAIRMENTS: impaired UE functional use, postural dysfunction, and pain.   ACTIVITY LIMITATIONS: carrying, lifting, reach over head, and hygiene/grooming  PARTICIPATION LIMITATIONS: cleaning, laundry, and community activity  PERSONAL FACTORS: Age, Time since onset of injury/illness/exacerbation, and 1-2 comorbidities: Parkinson's dz, OAL common peroneal neuropathy (2021), headache, OA, RLS, h/o back pain are also affecting patient's functional outcome.   REHAB POTENTIAL: Good  CLINICAL DECISION MAKING: Evolving/moderate complexity  EVALUATION COMPLEXITY: Moderate   GOALS: Goals reviewed with patient? Yes  SHORT TERM GOALS: Target date: 01/21/2024   Patient will be independent with initial  HEP to improve outcomes and carryover.  Baseline: 100% PT assist required for correct completion Goal status: MET--01/14/24  2.  Patient will report 25% improvement in R shoulder pain to improve QOL.   Baseline: 2/10 Goal status: MET--01/14/24 no pain  3.  Patient will demonstrate 4 to 4+/5 strength in the R shoulder musculature Baseline: see MMT tables above Goal status: INITIAL  LONG TERM GOALS: Target date: 02/18/2024   Patient will be independent with ongoing/advanced HEP for self-management at home.  Baseline: no advanced HEP yet 01/14/24:  advancing Goal  status: IN PROGRESS  2.  Patient will report 50-75% improvement in R shoulder pain to improve QOL.  Baseline: 2/10 Goal status: IN PROGRESS  3.  Patient to demonstrate improved upright posture with posterior shoulder girdle engaged to promote improved glenohumeral joint mobility. Baseline: forward head Goal status: INITIAL   5.  Patient will demonstrate improved R shoulder strength to >/= 5/5 for functional UE use. Baseline: Refer to above UE MMT table Goal status: INITIAL  6  Patient will report </= 30% on QuickDASH (MCID = 14%) to demonstrate improved functional ability.  Baseline: 45.45 Goal status: INITIAL   PLAN:  PT FREQUENCY: 1-2x/week  PT DURATION: 8 weeks  PLANNED INTERVENTIONS: 97164- PT Re-evaluation, 97110-Therapeutic exercises, 97530- Therapeutic activity, [id number]- Neuromuscular re-education, 97535- Self Care, 02859- Manual therapy, [id number]- Electrical stimulation (unattended), 97016- Vasopneumatic device, [id number]- Ultrasound, 02987- Traction (mechanical), [id number]- Ionotophoresis 4mg /ml Dexamethasone , Patient/Family education, Cryotherapy, and Moist heat  PLAN FOR NEXT SESSION: Try scapular clocks on the table vs. fitter, scapular depression and retraction movements in WB positions leaning on the table or counter;  snow angels or wall angels w/ ball behind elbow   Shaley Leavens, PT 01/28/2024, 2:42  PM   Date of referral: see referral Referring provider: Trudy Vaughn FALCON, MD Referring diagnosis? M25.511 (ICD-10-CM) - Pain in right shoulder M75.21 (ICD-10-CM) - Bicipital tendinitis, right shoulder Treatment diagnosis? (if different than referring diagnosis)  Acute pain of right shoulder  Muscle weakness (generalized)  Abnormal posture  What was this (referring dx) caused by? Other: insidious onset  Nature of Condition: Initial Onset (within last 3 months)   Laterality: Rt  Current Functional Measure Score: DASH 45%  Objective measurements identify impairments when they are compared to normal values, the uninvolved extremity, and prior level of function.  [x]  Yes  []  No  Objective assessment of functional ability: Moderate functional limitations   Briefly describe symptoms: 73 y/o patient referred to PT from PCP for R shoulder pain, bicipital tendonitis.  States pain started about 2 months ago for no apparent reason and actually started in her L shoulder and then was in L and R shoulders.   She denies any falls or trauma or lifting/moving things around the house.   Initially her pain was constant and even at night, but is better now.  States the L shoulder was much worse than the R shoulder, but L shoulder pain has completely resolved.   She just finished steroid treatment for the shoulder pain in the last 2-3 weeks.   States her night pain is mostly gone now in the R shoulder.  However, she still gets some pain with lifting heavy objects, reaching behind back or when she is doing her hair.  She states the pain is over the anterolateral R shoulder but intermittent.  Of note She has a h/o neck pain and imaging demonstrates R foraminal narrowing at C5/6 and C7/T1.   How did symptoms start: as above in note  Average pain intensity:  Last 24 hours: 1/10  Past week: 2/10  How often does the pt experience symptoms? Frequently  How much have the symptoms interfered with usual daily  activities? Moderately  How has condition changed since care began at this facility? Better  In general, how is the patients overall health? Good  Onset date: 2 months ago   BACK PAIN (STarT Back Screening Tool) - (When applicable):  NO BACK PAIN  Has your back pain spread down your leg(s) at sometime in the last 2 weeks? []   Yes   []  No Have you had pain in the shoulder or neck at sometime in the past 2 weeks? []  Yes   []  No Have you only walked short distances because of your back pain? []  Yes   []  No In the past 2 weeks, have you dressed more slowly than usual because of your back pain? []  Yes   []  No Do you think it is not really safe for person with a condition like yours to be physically active? []  Yes   []  No Have worrying thoughts been going through your mind a lot of the time? []  Yes   []  No Do you feel that your back pain is terrible and it is never going to get any better? []  Yes   []  No In general, have you stopped enjoying all the things you usually enjoy? []  Yes   []  No Overall, how bothersome has your back pain been in the last 2 weeks? []  Not at all   []  Slightly     []  Moderate   []  Very much     []  Extremely

## 2024-01-30 ENCOUNTER — Other Ambulatory Visit: Payer: Self-pay | Admitting: Neurology

## 2024-01-30 DIAGNOSIS — G2581 Restless legs syndrome: Secondary | ICD-10-CM

## 2024-01-30 DIAGNOSIS — G20A1 Parkinson's disease without dyskinesia, without mention of fluctuations: Secondary | ICD-10-CM

## 2024-02-04 ENCOUNTER — Ambulatory Visit: Admitting: Rehabilitation

## 2024-02-04 ENCOUNTER — Encounter: Payer: Self-pay | Admitting: Rehabilitation

## 2024-02-04 DIAGNOSIS — M25511 Pain in right shoulder: Secondary | ICD-10-CM

## 2024-02-04 DIAGNOSIS — R293 Abnormal posture: Secondary | ICD-10-CM

## 2024-02-04 DIAGNOSIS — M6281 Muscle weakness (generalized): Secondary | ICD-10-CM

## 2024-02-04 NOTE — Therapy (Addendum)
 " OUTPATIENT PHYSICAL THERAPY SHOULDER TREATMENT / DC SUMMARY   Patient Name: KAROLYNA BIANCHINI MRN: [id number] DOB:11/19/1950, 73 y.o., female Today's Date: 02/04/2024   END OF SESSION:  PT End of Session - 02/04/24 1156     Visit Number 5    Date for Recertification  02/18/24    Authorization Time Period 12/1 - 12/29.    1/8 used on 12/3    PT Start Time 1150    PT Stop Time 1235    PT Time Calculation (min) 45 min    Activity Tolerance Patient tolerated treatment well;No increased pain            Past Medical History:  Diagnosis Date   Bursitis    Cellulitis    arm   Common peroneal neuropathy, left 04/27/2019   Headache    Hyperlipidemia    Insomnia    OA (osteoarthritis)    Onychomycosis    Osteoarthritis    Parkinson disease (HCC) 03/23/2015   RLS (restless legs syndrome) 03/23/2015   Tenosynovitis    Past Surgical History:  Procedure Laterality Date   TUBAL LIGATION     Patient Active Problem List   Diagnosis Date Noted   Common peroneal neuropathy, left 04/27/2019   Upper airway cough syndrome 05/03/2015   RLS (restless legs syndrome) 03/23/2015   Parkinson disease (HCC) 03/23/2015   Fever 08/29/2011   Headache 08/29/2011   Knee pain, right 08/29/2011   Back pain 08/29/2011   Bacteremia due to Gram-positive bacteria 08/29/2011   Hepatitis 08/29/2011   Periorbital edema 08/29/2011   Facial rash 08/29/2011   Blurred vision, right eye 08/29/2011   Tremor 08/29/2011   Meningitis 08/29/2011   CHEST PAIN UNSPECIFIED 03/04/2007   PNEUMONIA, LEFT LOWER LOBE 03/03/2007   COUGH, CHRONIC 03/03/2007    PCP: Toribio Jerel MATSU, MD   REFERRING PROVIDER: Trudy Vaughn FALCON, MD   REFERRING DIAG: M25.511 (ICD-10-CM) - Pain in right shoulder M75.21 (ICD-10-CM) - Bicipital tendinitis, right shoulder  THERAPY DIAG:  Acute pain of right shoulder  Muscle weakness (generalized)  Abnormal posture  RATIONALE FOR EVALUATION AND TREATMENT: Rehabilitation  ONSET  DATE: 2 months ago  NEXT MD VISIT:    SUBJECTIVE:                                                                                                                                                                                                         SUBJECTIVE STATEMENT: States still having a lot of R shoulder pain.   Rates pain today 9/10.   Patient reports  she has seen orthopedics in the past, has had a R shoulder MRI,  and was dx with a R RTC tear.    EVAL:  73 y/o patient referred to PT from PCP for R shoulder pain, bicipital tendonitis.  States pain started about 2 months ago for no apparent reason and actually started in her L shoulder and then was in L and R shoulders.   She denies any falls or trauma or lifting/moving things around the house.   Initially her pain was constant and even at night, but is better now.  States the L shoulder was much worse than the R shoulder, but L shoulder pain has completely resolved.   She just finished steroid treatment for the shoulder pain in the last 2-3 weeks.   States her night pain is mostly gone now in the R shoulder.  However, she still gets some pain with lifting heavy objects, reaching behind back or when she is doing her hair.  She states the pain is over the anterolateral R shoulder but intermittent.  Of note She has a h/o neck pain and imaging demonstrates R foraminal narrowing at C5/6 and C7/T1.     PAIN: Are you having pain? Yes: NPRS scale: 0/10 now;  2/10 worst over the last week Pain location: R shoulder Pain description: sore/tenderness at times Aggravating factors: lifting, reaching behind back, reaching overhead Relieving factors: avoiding offending movements  PERTINENT HISTORY:  Parkinson's dz, OA  PRECAUTIONS: Fall  RED FLAGS: None  HAND DOMINANCE: Right  WEIGHT BEARING RESTRICTIONS: No  FALLS:  Has patient fallen in last 6 months? 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   FINDINGS: Degenerative changes of the acromioclavicular joint are seen. No acute fracture or dislocation is noted. No soft tissue abnormality is seen.   IMPRESSION: Mild degenerative change without acute abnormality.     Electronically Signed   By: Oneil Devonshire M.D.   On: 10/08/2023 23:12PROCEDURE: LUMBAR PUNCTURE FOR CERVICAL AND LUMBAR MYELOGRAM   CERVICAL AND LUMBAR MYELOGRAM   CT CERVICAL MYELOGRAM   CT LUMBAR MYELOGRAM   Lumbar puncture and intrathecal contrast administration were performed by Dr Gillie who will separately report for the portion of the procedure. I personally supervised acquisition of the myelogram images.   TECHNIQUE: Contiguous axial images were obtained through the Cervical and Lumbar spine after the intrathecal infusion of infusion. Coronal and sagittal reconstructions were obtained of the axial image sets.   FINDINGS: CERVICAL AND LUMBAR MYELOGRAM FINDINGS:   Free flow of intrathecal contrast with no evident filling defects.   Loaded lumbar images were not required. Flexion and extension cervical views show no dynamic impingement or listhesis.   CT CERVICAL MYELOGRAM FINDINGS:   Alignment: Straightening of the cervical spine in the sagittal plane  with mild levocurvature.    Osseous: Negative for fracture, erosion, or bone lesion.   Extra-spinal: No incidental mass or visible perispinal inflammation.   Cord: Normal shape and morphology. No abnormal intrathecal filling defect.   Disc levels:   C2-3: Disc narrowing and ventral spurring.  No impingement   C3-4: Spondylosis with disc narrowing and shallow central protrusion that contacts but does not compress the cord. Patent foramina   C4-5: Spondylosis.  No neural compression   C5-6: Disc narrowing and bulky ventral spurring. Uncovertebral spurring on the right more than left with high-grade right foraminal impingement. Patent spinal canal   C6-7: Disc narrowing and ventral spurring.  No neural impingement   C7-T1:Prominent facet osteoarthritis asymmetric on the right with spurring causing right foraminal impingement that is at least moderate. Patent spinal canal.   CT LUMBAR MYELOGRAM FINDINGS:   Segmentation: 5 lumbar type vertebrae based on the lowest ribs (which are hypoplastic).   Alignment: Mild levoscoliosis.  Mild L2-3 and L3-4 retrolisthesis.   Vertebrae: Hemangioma appearance in the T11 vertebral body. No evidence of fracture or aggressive bone lesion. No erosions.   Conus: Tip terminates at T12-L1. No abnormal intrathecal filling defect.   Extra-spinal: Atheromatous calcification.   Disc levels:   T10-11 and T11-12: Disc narrowing with bulging. No cord impingement.   T12- L1: Disc narrowing. Degenerative facet spurring on both sides. No neural compression   L1-L2: Disc narrowing with gas containing fissure. Disc bulging and buttressing osteophytes. Negative facets. No neural compression   L2-L3: Disc collapse with bulging and endplate ridging. Mild facet spurring. Mild bilateral foraminal narrowing. Patent spinal canal   L3-L4: Disc narrowing with right eccentric bulging and bulky buttressing osteophyte. Degenerative facet spurring on the right. Moderate to advanced  right foraminal stenosis. Right subarticular recess narrowing which crowds the right L4 nerve root.   L4-L5: Disc collapse with bulky endplate spurring. Degenerative facet spurring on both sides. Biforaminal impingement, advanced on the left.   L5-S1:Disc narrowing and bulging with a left foraminal protrusion and buttressing osteophytes obliterating the left foramen fat. Degenerative facet spurring on both sides.   IMPRESSION: Cervical spine:   1. Generalized degenerative disease most notable at C5-6 and C7-T1 where there is right foraminal impingement. 2. Diffusely patent spinal canal.   Lumbar spine:   1. Generalized advanced degenerative disease with mild scoliosis. 2. Advanced foraminal impingement on the left at L4-5 and L5-S1, especially at L5-S1. 3. Moderate to advanced foraminal impingement on the right at L3-4 and L4-5.     Electronically Signed   By: Cassondra Roulette M.D.   On: 02/16/2020 09:38  PATIENT SURVEYS:  Quick Dash:  QUICK DASH  Please rate your ability do the following activities in the last week by selecting the number below the appropriate response.   Activities Rating  Open a tight or new jar.  3 = Moderate difficulty  Do heavy household chores (e.g., wash walls, floors). 5 = Unable  Carry a shopping bag or briefcase 2 = Mild difficulty  Wash your back. 5 = Unable  Use a knife to cut food. 3 = Moderate difficulty  Recreational activities in which you take some force or impact through your arm, shoulder or hand (e.g., golf, hammering, tennis, etc.). 3 = Moderate difficulty  During the past week, to what extent has your arm, shoulder or hand problem interfered with your normal social activities with family, friends, neighbors or groups?  3 = Moderately  During the past week, were you  limited in your work or other regular daily activities as a result of your arm, shoulder or hand problem? 1 = Not limited at all  Rate the severity of the following  symptoms in the last week: Arm, Shoulder, or hand pain. 2 = Mild  Rate the severity of the following symptoms in the last week: Tingling (pins and needles) in your arm, shoulder or hand. 1 = none  During the past week, how much difficulty have you had sleeping because of the pain in your arm, shoulder or hand?  3 = Moderate difficulty   (A QuickDASH score may not be calculated if there is greater than 1 missing item.)  Quick Dash Disability/Symptom Score: [(sum of 31 (n) responses/11 (n)] x 25 = 45.45  Minimally Clinically Important Difference (MCID): 15-20 points  (Franchignoni, F. et al. (2013). Minimally clinically important difference of the disabilities of the arm, shoulder, and hand outcome measures (DASH) and its shortened version (Quick DASH). Journal of Orthopaedic & Sports Physical Therapy, 44(1), 30-39)   COGNITION: Overall cognitive status: Within functional limits for tasks assessed     SENSATION: WFL  POSTURE: rounded shoulders and forward head  CERVICAL ROM:  cervical ROM does not  cause or reproduce any of her shoulder pain  Active ROM A/PROM (deg) eval  Flexion 80%  Extension 75%  Right lateral flexion 50%  Left lateral flexion 65%  Right rotation 90%  Left rotation 75%   (Blank rows = not tested)   UPPER EXTREMITY ROM:   Active ROM Right eval Left eval  Shoulder flexion 180+ 180+  Shoulder extension 70 70  Shoulder abduction 180 180  Shoulder adduction Can reach well past her opposite shoulder nt  Shoulder internal rotation To T9 functional and 90+ at 90 ABD T9  Shoulder external rotation 85 at neutral;  112 deg at 90 ABD   Elbow flexion    Elbow extension    Wrist flexion    Wrist extension    Wrist ulnar deviation    Wrist radial deviation    Wrist pronation    Wrist supination    (Blank rows = not tested)  UPPER EXTREMITY MMT:  MMT Right eval Left eval  Shoulder flexion 4 5  Shoulder extension    Shoulder abduction 4+ 4+  Shoulder  adduction 5 5  Shoulder internal rotation 5 5  Shoulder external rotation 4 5  Middle trapezius 4- 4+  Lower trapezius    Elbow flexion 5;  can do curls with 7 lb DB painfree 5  Elbow extension 5 at neutral;  4- and painful at 90 deg shoulder ABD   Wrist flexion    Wrist extension    Wrist ulnar deviation    Wrist radial deviation    Wrist pronation    Wrist supination    Grip strength (lbs)    (Blank rows = not tested)  SHOULDER SPECIAL TESTS: Impingement tests: Neer impingement test: negative and Hawkins/Kennedy impingement test: negative SLAP lesions: NT Instability tests: Apprehension test: positive  Rotator cuff assessment: Drop arm test: negative, Empty can test: positive , and Gerber lift off test: negative but weak Biceps assessment: Yergason's test: negative and Speed's test: negative  JOINT MOBILITY TESTING:  Hypermobile R shoulder joint mobility  PALPATION:  TTP over the anterior R shoulder at subacromial space, coracoid, bicipital groove, and greater tuberosity    TODAY'S TREATMENT:  02/04/24 NuStep L5 x 6' BUE/BLE  THERAPEUTIC ACTIVITIES: To improve functional performance.  Demonstration, verbal and tactile  cues throughout for technique. Seated L cervical SB stretch x 1' x 3 Supine shoulder ER at 90/90 x 30  NEUROMUSCULAR RE-EDUCATION: To improve kinesthesia, posture, and proprioception. Supine scapular depression x 20 BUE Supine scapular retraction x 20 BUE Supine shoulder flexion palm up w/ assisted scapular retraction and depression x 3/10 Supine scapular depression at multiple angles of abduction x 5'  MANUAL THERAPY: To promote pain modulation utilizing manual TP therapy. Deep pressure to L UT trigger points x 3 Manual cervical traction w/ towel x 1' x 3 Grade 3-4 posterior glides to R GH joint x 20-30 reps at 0 deg and at 90 deg  01/28/24 THERAPEUTIC EXERCISE: To improve strength and endurance.  Demonstration, verbal and tactile cues throughout for  technique. UBE L0 x 2'F/2'B  THERAPEUTIC ACTIVITIES: To improve functional performance.  Demonstration, verbal and tactile cues throughout for technique. Bent over at counter:  Pendulums CW x 20; CCW x 20 RUE  Shoulder ext x 20 RUE  Shoulder rowing 2# x 20 RUE  High row/elbow out x 20 RUE  Doorway shoulder extension stretch x 1' x 2 BUE  NEUROMUSCULAR RE-EDUCATION: To improve kinesthesia, posture, and proprioception. Supine bent elbow scap retraction x 2/10 BUE Supine scapular retraction w/ shoulder at 90 deg flexion Supine circles at 90 CCW x 20 w/ retraction; CW increased pain so stopped SL ER x 20 RUE w/ manual facilitation for correct performance SL HABD x 20 RUE w/ manual facilitation for correct performance  MANUAL THERAPY: To promote improved flexibility utilizing joint mobilization and kinesiotaping. Gentle grade 3-4 GH posterior glides w/ arm at 90 degrees scaption and at neutral/0 degrees x 20-30 reps Gentle inferior glides to R GH joint x 20-30 reps in standing at doorway and RUE at 90/90 resting on PT shoulder KT tape w/ 1 circumferential I strip and a second I strip anterior to posterior no tension placed in exaggerated/corrected posture for   Postural reminders and inhibition of the pecs  01/20/24 THERAPEUTIC EXERCISE: To improve strength and ROM.  Demonstration, verbal and tactile cues throughout for technique. Nustep L5x44min UE/LE Seated flexion slide with orange pball x 20; scaption x 20 NEUROMUSCULAR RE-EDUCATION: To improve coordination, kinesthesia, posture, and proprioception.  Bent over shoulder extension x 20 Bent over HABD x 20 Standing rows RTB x 20 Standing shoulder extension x 15 GTB Supine scapular retraction 10x3' Supine shoulder circles with scap retraction and depression Seated scapular retraction 01/14/24 THERAPEUTIC EXERCISE: To improve strength and ROM.  Demonstration, verbal and tactile cues throughout for technique. UBE L0 x  2'F/2'B  THERAPEUTIC ACTIVITIES: To improve functional performance.  Demonstration, verbal and tactile cues throughout for technique. SL ER 2# x 20 RUE SL HABD 0#  x 20 RUE Supine circles at 90 x 20 CW; x 20 CCW---CCW is a little painful so she is advised not to do this one at home w/ HEP Supine 90/90 shoulder ER 1# x 20 RUE Standing Lat PD 5# x 2/10 BUE Seated Lat PD 15# x 2/10 BUE Seated single arm Lat PD 5# x 2/10 RUE Bent over HABD x 20 RUE Bent over flexion/LT raise x 20 RUE  NEUROMUSCULAR RE-EDUCATION: To improve coordination, kinesthesia, posture, and proprioception. Supine shoulder ABD slide up table by PT w/ gentle adductive force by patient to teach shoulder depression w/ ABD Supine shoulder ABD w/ adductive isometrics at multiple angles of ABD RUE  12/24/23 SELF CARE: Provided education on PT POC progression; initial HEP   PATIENT EDUCATION:  Education  details: HEP review and HEP update  Person educated: Patient Education method: Explanation, Demonstration, Verbal cues, Tactile cues, Handouts, and MedBridgeGO app access provided Education comprehension: verbalized understanding, verbal cues required, tactile cues required, and needs further education  HOME EXERCISE PROGRAM: Access Code: [email or web address] URL: [email or web address] Date: 02/04/2024 Prepared by: Garnette Montclair  Exercises - Supine Shoulder Circles with Weight  - 1 x daily - 7 x weekly - 3 sets - 10 reps - Sidelying Shoulder External Rotation with Dumbbell  - 1 x daily - 7 x weekly - 3 sets - 10 reps - Sidelying Shoulder Horizontal Abduction  - 1 x daily - 7 x weekly - 3 sets - 10 reps - Seated Shoulder External Rotation in Abduction Supported with Dumbbell  - 1 x daily - 7 x weekly - 3 sets - 10 reps - Single Arm Bent Over Shoulder Horizontal Abduction with Dumbbell - Thumb Up  - 1 x daily - 7 x weekly - 3 sets - 10 reps - Standing Shoulder Row with Anchored Resistance  - 1 x daily - 7 x weekly  - 3 sets - 10 reps - Single Arm Bent Over Shoulder Scaption with Dumbbell  - 1 x daily - 7 x weekly - 3 sets - 10 reps - Bent Over Single Arm Shoulder Row with Dumbbell  - 1 x daily - 7 x weekly - 3 sets - 10 reps - Shoulder extension with resistance - Neutral  - 1 x daily - 7 x weekly - 3 sets - 10 reps - Supine Shoulder External Rotation in Abduction  - 1 x daily - 7 x weekly - 3 sets - 10 reps - Supine Scapular Retraction  - 1 x daily - 7 x weekly - 3 sets - 10 reps - Standing Scapular Depression  - 1 x daily - 7 x weekly - 3 sets - 10 reps.    ASSESSMENT:  CLINICAL IMPRESSION: Patient is having R shoulder pain with any elevation movements and reports she has had a R shoulder MRI in past that showed R RTC tear.  She is having a hard time progressing due to the pain.  Focused today on pain relief with MFR to trigger points in her R upper trap  EVAL: Marita P Llanas is a 73 y.o. female who was referred to physical therapy for evaluation and treatment for R shoulder pain and bicipital tendonitis.   She just finished prednisone  recently per her report and it really helped.   In fact she doesn't have any pain with resisted elbow flexion, speed's test, or Yergason's.   However, she does have painful empty can, full can, and weak infraspinatus tests.    Patient reports onset of B shoulder pain beginning a couple of months ago for no reason.   L shoulder was actually much worse than the R but L shoulder pain has fully resolved.  R shoulder Pain is worse with lifting, reaching behind back or behind head for ADL.   Patient does seem to have considerable hypermobility of the R shoulder.  RTC and scapular stabilizers are weak.   Patient has deficits in  R shoulder strength, somewhat forward head abnormal posture, and pain around the anterolateral shoulder which are interfering with ADLs and are impacting quality of life.  On QuickDASH patient scored  45.45% disability.  Sulma will benefit from skilled PT to  address above deficits to improve mobility and activity tolerance with decreased pain interference.   OBJECTIVE IMPAIRMENTS: impaired UE functional  use, postural dysfunction, and pain.   ACTIVITY LIMITATIONS: carrying, lifting, reach over head, and hygiene/grooming  PARTICIPATION LIMITATIONS: cleaning, laundry, and community activity  PERSONAL FACTORS: Age, Time since onset of injury/illness/exacerbation, and 1-2 comorbidities: Parkinson's dz, OAL common peroneal neuropathy (2021), headache, OA, RLS, h/o back pain are also affecting patient's functional outcome.   REHAB POTENTIAL: Good  CLINICAL DECISION MAKING: Evolving/moderate complexity  EVALUATION COMPLEXITY: Moderate   GOALS: Goals reviewed with patient? Yes  SHORT TERM GOALS: Target date: 01/21/2024   Patient will be independent with initial HEP to improve outcomes and carryover.  Baseline: 100% PT assist required for correct completion Goal status: MET--01/14/24  2.  Patient will report 25% improvement in R shoulder pain to improve QOL.   Baseline: 2/10 Goal status: MET--01/14/24 no pain  3.  Patient will demonstrate 4 to 4+/5 strength in the R shoulder musculature Baseline: see MMT tables above Goal status: INITIAL  LONG TERM GOALS: Target date: 02/18/2024   Patient will be independent with ongoing/advanced HEP for self-management at home.  Baseline: no advanced HEP yet 01/14/24:  advancing Goal status: IN PROGRESS  2.  Patient will report 50-75% improvement in R shoulder pain to improve QOL.  Baseline: 2/10 Goal status: IN PROGRESS  3.  Patient to demonstrate improved upright posture with posterior shoulder girdle engaged to promote improved glenohumeral joint mobility. Baseline: forward head Goal status: INITIAL   5.  Patient will demonstrate improved R shoulder strength to >/= 5/5 for functional UE use. Baseline: Refer to above UE MMT table Goal status: INITIAL  6  Patient will report </= 30% on  QuickDASH (MCID = 14%) to demonstrate improved functional ability.  Baseline: 45.45 Goal status: INITIAL   PLAN:  PT FREQUENCY: 1-2x/week  PT DURATION: 8 weeks  PLANNED INTERVENTIONS: 97164- PT Re-evaluation, 97110-Therapeutic exercises, 97530- Therapeutic activity, [id number]- Neuromuscular re-education, 97535- Self Care, 02859- Manual therapy, [id number]- Electrical stimulation (unattended), 97016- Vasopneumatic device, [id number]- Ultrasound, 02987- Traction (mechanical), [id number]- Ionotophoresis 4mg /ml Dexamethasone , Patient/Family education, Cryotherapy, and Moist heat  PLAN FOR NEXT SESSION: Try scapular clocks on the table vs. fitter, scapular depression and retraction movements in WB positions leaning on the table or counter;  snow angels or wall angels w/ ball behind elbow   Daryl Beehler, PT 02/04/2024, 8:33 PM   Date of referral: see referral Referring provider: Trudy Vaughn FALCON, MD Referring diagnosis? M25.511 (ICD-10-CM) - Pain in right shoulder M75.21 (ICD-10-CM) - Bicipital tendinitis, right shoulder Treatment diagnosis? (if different than referring diagnosis)  Acute pain of right shoulder  Muscle weakness (generalized)  Abnormal posture  What was this (referring dx) caused by? Other: insidious onset  Nature of Condition: Initial Onset (within last 3 months)   Laterality: Rt  Current Functional Measure Score: DASH 45%  Objective measurements identify impairments when they are compared to normal values, the uninvolved extremity, and prior level of function.  [x]  Yes  []  No  Objective assessment of functional ability: Moderate functional limitations   Briefly describe symptoms: 73 y/o patient referred to PT from PCP for R shoulder pain, bicipital tendonitis.  States pain started about 2 months ago for no apparent reason and actually started in her L shoulder and then was in L and R shoulders.   She denies any falls or trauma or lifting/moving things around the house.    Initially her pain was constant and even at night, but is better now.  States the L shoulder was much worse than the R shoulder, but L shoulder pain has completely  resolved.   She just finished steroid treatment for the shoulder pain in the last 2-3 weeks.   States her night pain is mostly gone now in the R shoulder.  However, she still gets some pain with lifting heavy objects, reaching behind back or when she is doing her hair.  She states the pain is over the anterolateral R shoulder but intermittent.  Of note She has a h/o neck pain and imaging demonstrates R foraminal narrowing at C5/6 and C7/T1.   How did symptoms start: as above in note  Average pain intensity:  Last 24 hours: 1/10  Past week: 2/10  How often does the pt experience symptoms? Frequently  How much have the symptoms interfered with usual daily activities? Moderately  How has condition changed since care began at this facility? Better  In general, how is the patients overall health? Good  Onset date: 2 months ago   BACK PAIN (STarT Back Screening Tool) - (When applicable):  NO BACK PAIN  Has your back pain spread down your leg(s) at sometime in the last 2 weeks? []  Yes   []  No Have you had pain in the shoulder or neck at sometime in the past 2 weeks? []  Yes   []  No Have you only walked short distances because of your back pain? []  Yes   []  No In the past 2 weeks, have you dressed more slowly than usual because of your back pain? []  Yes   []  No Do you think it is not really safe for person with a condition like yours to be physically active? []  Yes   []  No Have worrying thoughts been going through your mind a lot of the time? []  Yes   []  No Do you feel that your back pain is terrible and it is never going to get any better? []  Yes   []  No In general, have you stopped enjoying all the things you usually enjoy? []  Yes   []  No Overall, how bothersome has your back pain been in the last 2 weeks? []  Not at all   []   Slightly     []  Moderate   []  Very much     []  Extremely   "

## 2024-02-11 ENCOUNTER — Ambulatory Visit: Admitting: Rehabilitation

## 2024-02-16 ENCOUNTER — Telehealth: Payer: Self-pay | Admitting: Neurology

## 2024-02-16 NOTE — Telephone Encounter (Signed)
 Mahdiya called in wanting to speak with [facility]. She stated that she has Parkinson's, and it seems like freezing moments are getting worse. She stated that she need an excuse from Jury duty to not be able to serve, due to not being able to sit for long periods of time. She requested a call back.   PH: [phone]

## 2024-06-01 ENCOUNTER — Ambulatory Visit: Admitting: Neurology
